# Patient Record
Sex: Female | Born: 1950
Health system: Southern US, Community
[De-identification: ages and names within clinical notes are randomized; demographics above are authoritative.]

## PROBLEM LIST (undated history)

## (undated) DIAGNOSIS — E0590 Thyrotoxicosis, unspecified without thyrotoxic crisis or storm: Secondary | ICD-10-CM

## (undated) DIAGNOSIS — Z8774 Personal history of (corrected) congenital malformations of heart and circulatory system: Secondary | ICD-10-CM

## (undated) DIAGNOSIS — I10 Essential (primary) hypertension: Secondary | ICD-10-CM

## (undated) DIAGNOSIS — I2510 Atherosclerotic heart disease of native coronary artery without angina pectoris: Secondary | ICD-10-CM

## (undated) DIAGNOSIS — M48062 Spinal stenosis, lumbar region with neurogenic claudication: Secondary | ICD-10-CM

## (undated) DIAGNOSIS — R519 Headache, unspecified: Secondary | ICD-10-CM

## (undated) DIAGNOSIS — R51 Headache: Secondary | ICD-10-CM

## (undated) DIAGNOSIS — J449 Chronic obstructive pulmonary disease, unspecified: Secondary | ICD-10-CM

## (undated) DIAGNOSIS — I6529 Occlusion and stenosis of unspecified carotid artery: Secondary | ICD-10-CM

## (undated) DIAGNOSIS — I739 Peripheral vascular disease, unspecified: Secondary | ICD-10-CM

## (undated) HISTORY — DX: Chronic obstructive pulmonary disease, unspecified: J44.9

## (undated) HISTORY — PX: ABDOMINAL HYSTERECTOMY: SHX81

## (undated) HISTORY — PX: OTHER SURGICAL HISTORY: SHX169

## (undated) HISTORY — PX: CORONARY ANGIOPLASTY: SHX604

## (undated) HISTORY — DX: Occlusion and stenosis of unspecified carotid artery: I65.29

## (undated) HISTORY — PX: EYE SURGERY: SHX253

## (undated) MED FILL — Ferumoxytol Inj 510 MG/17ML (30 MG/ML) (Elemental Fe): INTRAVENOUS | Qty: 17 | Status: AC

---

## 2006-04-20 ENCOUNTER — Emergency Department: Payer: Self-pay | Admitting: Unknown Physician Specialty

## 2010-03-23 ENCOUNTER — Ambulatory Visit: Payer: Self-pay | Admitting: Podiatry

## 2010-03-26 ENCOUNTER — Ambulatory Visit: Payer: Self-pay | Admitting: Podiatry

## 2013-04-12 ENCOUNTER — Ambulatory Visit: Payer: Self-pay | Admitting: Internal Medicine

## 2014-11-24 DIAGNOSIS — G43709 Chronic migraine without aura, not intractable, without status migrainosus: Secondary | ICD-10-CM | POA: Insufficient documentation

## 2014-11-24 DIAGNOSIS — I10 Essential (primary) hypertension: Secondary | ICD-10-CM | POA: Insufficient documentation

## 2014-11-24 DIAGNOSIS — I739 Peripheral vascular disease, unspecified: Secondary | ICD-10-CM

## 2014-11-24 DIAGNOSIS — I779 Disorder of arteries and arterioles, unspecified: Secondary | ICD-10-CM | POA: Insufficient documentation

## 2014-12-24 ENCOUNTER — Ambulatory Visit: Payer: Self-pay | Admitting: Internal Medicine

## 2015-01-29 ENCOUNTER — Ambulatory Visit
Admit: 2015-01-29 | Disposition: A | Discharge status: Home / Self Care | Payer: Self-pay | Attending: Internal Medicine | Admitting: Internal Medicine

## 2015-02-02 ENCOUNTER — Ambulatory Visit
Admit: 2015-02-02 | Disposition: A | Discharge status: Home / Self Care | Payer: Self-pay | Attending: Gastroenterology | Admitting: Gastroenterology

## 2015-02-05 ENCOUNTER — Ambulatory Visit
Admit: 2015-02-05 | Disposition: A | Discharge status: Home / Self Care | Payer: Self-pay | Attending: Internal Medicine | Admitting: Internal Medicine

## 2015-02-16 LAB — SURGICAL PATHOLOGY

## 2015-07-06 DIAGNOSIS — Z0000 Encounter for general adult medical examination without abnormal findings: Secondary | ICD-10-CM | POA: Insufficient documentation

## 2015-07-06 DIAGNOSIS — J449 Chronic obstructive pulmonary disease, unspecified: Secondary | ICD-10-CM | POA: Insufficient documentation

## 2015-07-06 HISTORY — DX: Chronic obstructive pulmonary disease, unspecified: J44.9

## 2015-08-13 DIAGNOSIS — M7540 Impingement syndrome of unspecified shoulder: Secondary | ICD-10-CM | POA: Insufficient documentation

## 2016-03-04 DIAGNOSIS — M25559 Pain in unspecified hip: Secondary | ICD-10-CM | POA: Insufficient documentation

## 2016-06-24 ENCOUNTER — Other Ambulatory Visit: Payer: Self-pay

## 2016-06-30 ENCOUNTER — Encounter: Payer: Self-pay | Admitting: *Deleted

## 2016-06-30 ENCOUNTER — Encounter
Admission: RE | Admit: 2016-06-30 | Discharge: 2016-06-30 | Disposition: A | Discharge status: Home / Self Care | Payer: Medicare Other | Source: Ambulatory Visit | Attending: Podiatry | Admitting: Podiatry

## 2016-06-30 DIAGNOSIS — J449 Chronic obstructive pulmonary disease, unspecified: Secondary | ICD-10-CM | POA: Diagnosis not present

## 2016-06-30 DIAGNOSIS — E042 Nontoxic multinodular goiter: Secondary | ICD-10-CM | POA: Diagnosis not present

## 2016-06-30 DIAGNOSIS — F17200 Nicotine dependence, unspecified, uncomplicated: Secondary | ICD-10-CM | POA: Diagnosis not present

## 2016-06-30 DIAGNOSIS — I7789 Other specified disorders of arteries and arterioles: Secondary | ICD-10-CM | POA: Diagnosis not present

## 2016-06-30 DIAGNOSIS — G43909 Migraine, unspecified, not intractable, without status migrainosus: Secondary | ICD-10-CM | POA: Diagnosis not present

## 2016-06-30 DIAGNOSIS — M2012 Hallux valgus (acquired), left foot: Secondary | ICD-10-CM | POA: Diagnosis present

## 2016-06-30 DIAGNOSIS — Z86010 Personal history of colonic polyps: Secondary | ICD-10-CM | POA: Diagnosis not present

## 2016-06-30 DIAGNOSIS — I10 Essential (primary) hypertension: Secondary | ICD-10-CM | POA: Diagnosis not present

## 2016-06-30 HISTORY — DX: Peripheral vascular disease, unspecified: I73.9

## 2016-06-30 HISTORY — DX: Headache: R51

## 2016-06-30 HISTORY — DX: Headache, unspecified: R51.9

## 2016-06-30 HISTORY — DX: Essential (primary) hypertension: I10

## 2016-06-30 LAB — CBC
HEMATOCRIT: 36.9 % (ref 35.0–47.0)
HEMOGLOBIN: 12.6 g/dL (ref 12.0–16.0)
MCH: 32.5 pg (ref 26.0–34.0)
MCHC: 34.1 g/dL (ref 32.0–36.0)
MCV: 95.3 fL (ref 80.0–100.0)
Platelets: 63 10*3/uL — ABNORMAL LOW (ref 150–440)
RBC: 3.87 MIL/uL (ref 3.80–5.20)
RDW: 12.5 % (ref 11.5–14.5)
WBC: 5.3 10*3/uL (ref 3.6–11.0)

## 2016-06-30 LAB — DIFFERENTIAL
BASOS ABS: 0 10*3/uL (ref 0–0.1)
Basophils Relative: 1 %
Eosinophils Absolute: 0.2 10*3/uL (ref 0–0.7)
Eosinophils Relative: 3 %
LYMPHS ABS: 1.9 10*3/uL (ref 1.0–3.6)
MONO ABS: 0.5 10*3/uL (ref 0.2–0.9)
NEUTROS ABS: 2.8 10*3/uL (ref 1.4–6.5)
Neutrophils Relative %: 52 %

## 2016-06-30 LAB — POTASSIUM: Potassium: 3 mmol/L — ABNORMAL LOW (ref 3.5–5.1)

## 2016-06-30 NOTE — Patient Instructions (Addendum)
  Your procedure is scheduled FO:[id number] 8, 2017 (Friday) Report to Same Day Surgery 2nd floor [facility] To find out your arrival time please call [phone] between 1PM - 3PM on June 30, 2016 (Thursday)  Remember: Instructions that are not followed completely may result in serious medical risk, up to and including death, or upon the discretion of your surgeon and anesthesiologist your surgery may need to be rescheduled.    _x___ 1. Do not eat food or drink liquids after midnight. No gum chewing or hard candies.     _x__ 2. No Alcohol for 24 hours before or after surgery.   _x__3. No Smoking for 24 prior to surgery.   ____  4. Bring all medications with you on the day of surgery if instructed.    __x__ 5. Notify your doctor if there is any change in your medical condition     (cold, fever, infections).     Do not wear jewelry, make-up, hairpins, clips or nail polish.  Do not wear lotions, powders, or perfumes. You may wear deodorant.  Do not shave 48 hours prior to surgery. Men may shave face and neck.  Do not bring valuables to the hospital.    [facility] is not responsible for any belongings or valuables.               Contacts, dentures or bridgework may not be worn into surgery.  Leave your suitcase in the car. After surgery it may be brought to your room.  For patients admitted to the hospital, discharge time is determined by your treatment team.   Patients discharged the day of surgery will not be allowed to drive home.    Please read over the following fact sheets that you were given:   [facility] Preparing for Surgery and or MRSA Information   ___ Take these medicines the morning of surgery with A SIP OF WATER:    1.   2.  3.  4.  5.  6.  ____ Fleet Enema (as directed)   _x___ Use CHG Soap or sage wipes as directed on instruction sheet   ____ Use inhalers on the day of surgery and bring to hospital day of surgery  ____ Stop metformin 2 days prior  to surgery    ____ Take 1/2 of usual insulin dose the night before surgery and none on the morning of surgery,            _x___ Stop aspirin or coumadin, or plavix (NO ASPIRIN)  _x__ Stop Anti-inflammatories such as Advil, Aleve, Ibuprofen, Motrin, Naproxen,          Naprosyn, Goodies powders or aspirin products. Ok to take Tylenol.   ____ Stop supplements until after surgery.    ____ Bring C-Pap to the hospital.

## 2016-06-30 NOTE — Pre-Procedure Instructions (Signed)
K+ results called and faxed to Dr. Cleda Mccreedy office, informed Jenny Reichmann patient will need treatment prior to surgery per anesthesia protocol.

## 2016-07-01 ENCOUNTER — Ambulatory Visit
Admission: RE | Admit: 2016-07-01 | Discharge: 2016-07-01 | Disposition: A | Discharge status: Home / Self Care | Payer: Medicare Other | Source: Ambulatory Visit | Attending: Podiatry | Admitting: Podiatry

## 2016-07-01 ENCOUNTER — Encounter
Admission: RE | Disposition: A | Discharge status: Home / Self Care | Payer: Self-pay | Source: Ambulatory Visit | Attending: Podiatry

## 2016-07-01 ENCOUNTER — Ambulatory Visit: Payer: Medicare Other | Admitting: Anesthesiology

## 2016-07-01 ENCOUNTER — Encounter: Payer: Self-pay | Admitting: *Deleted

## 2016-07-01 DIAGNOSIS — I7789 Other specified disorders of arteries and arterioles: Secondary | ICD-10-CM | POA: Insufficient documentation

## 2016-07-01 DIAGNOSIS — E042 Nontoxic multinodular goiter: Secondary | ICD-10-CM | POA: Insufficient documentation

## 2016-07-01 DIAGNOSIS — Z86010 Personal history of colonic polyps: Secondary | ICD-10-CM | POA: Insufficient documentation

## 2016-07-01 DIAGNOSIS — F17200 Nicotine dependence, unspecified, uncomplicated: Secondary | ICD-10-CM | POA: Insufficient documentation

## 2016-07-01 DIAGNOSIS — M2012 Hallux valgus (acquired), left foot: Secondary | ICD-10-CM | POA: Insufficient documentation

## 2016-07-01 DIAGNOSIS — G43909 Migraine, unspecified, not intractable, without status migrainosus: Secondary | ICD-10-CM | POA: Insufficient documentation

## 2016-07-01 DIAGNOSIS — I10 Essential (primary) hypertension: Secondary | ICD-10-CM | POA: Insufficient documentation

## 2016-07-01 DIAGNOSIS — J449 Chronic obstructive pulmonary disease, unspecified: Secondary | ICD-10-CM | POA: Insufficient documentation

## 2016-07-01 HISTORY — PX: HALLUX VALGUS AUSTIN: SHX6623

## 2016-07-01 LAB — POCT I-STAT 4, (NA,K, GLUC, HGB,HCT)
GLUCOSE: 94 mg/dL (ref 65–99)
HEMATOCRIT: 38 % (ref 36.0–46.0)
Hemoglobin: 12.9 g/dL (ref 12.0–15.0)
Potassium: 3.1 mmol/L — ABNORMAL LOW (ref 3.5–5.1)
Sodium: 142 mmol/L (ref 135–145)

## 2016-07-01 LAB — POTASSIUM: POTASSIUM: 3.3 mmol/L — AB (ref 3.5–5.1)

## 2016-07-01 SURGERY — CORRECTION, HALLUX VALGUS
Anesthesia: General | Laterality: Left

## 2016-07-01 MED ORDER — CEFAZOLIN SODIUM-DEXTROSE 2-4 GM/100ML-% IV SOLN
INTRAVENOUS | Status: AC
  Filled 2016-07-01: qty 100

## 2016-07-01 MED ORDER — FENTANYL CITRATE (PF) 100 MCG/2ML IJ SOLN: 25.0000 ug | INTRAMUSCULAR | Status: DC | PRN

## 2016-07-01 MED ORDER — NEOMYCIN-POLYMYXIN B GU 40-200000 IR SOLN
Status: AC
  Filled 2016-07-01: qty 2

## 2016-07-01 MED ORDER — BUPIVACAINE HCL (PF) 0.5 % IJ SOLN
INTRAMUSCULAR | Status: AC
Start: 2016-07-01 — End: 2016-07-01
  Filled 2016-07-01: qty 30

## 2016-07-01 MED ORDER — FAMOTIDINE 20 MG PO TABS
ORAL_TABLET | ORAL | Status: AC
  Filled 2016-07-01: qty 1

## 2016-07-01 MED ORDER — PROPOFOL 10 MG/ML IV BOLUS
INTRAVENOUS | Status: DC | PRN
  Administered 2016-07-01: 20 mg via INTRAVENOUS
  Administered 2016-07-01 (×2): 30 mg via INTRAVENOUS
  Administered 2016-07-01: 50 mg via INTRAVENOUS
  Administered 2016-07-01: 30 mg via INTRAVENOUS
  Administered 2016-07-01: 20 mg via INTRAVENOUS
  Administered 2016-07-01 (×2): 30 mg via INTRAVENOUS

## 2016-07-01 MED ORDER — CEFAZOLIN SODIUM-DEXTROSE 2-4 GM/100ML-% IV SOLN
2.0000 g | Freq: Once | INTRAVENOUS | Status: AC
Start: 2016-07-01 — End: 2016-07-01
  Administered 2016-07-01: 2 g via INTRAVENOUS

## 2016-07-01 MED ORDER — OXYCODONE HCL 5 MG PO TABS: 5.0000 mg | ORAL_TABLET | Freq: Once | ORAL | Status: DC | PRN

## 2016-07-01 MED ORDER — PROPOFOL 500 MG/50ML IV EMUL
INTRAVENOUS | Status: DC | PRN
  Administered 2016-07-01: 120 ug/kg/min via INTRAVENOUS

## 2016-07-01 MED ORDER — BUPIVACAINE HCL (PF) 0.5 % IJ SOLN
INTRAMUSCULAR | Status: DC | PRN
  Administered 2016-07-01: 10 mL

## 2016-07-01 MED ORDER — FAMOTIDINE 20 MG PO TABS
20.0000 mg | ORAL_TABLET | Freq: Once | ORAL | Status: AC
  Administered 2016-07-01: 20 mg via ORAL

## 2016-07-01 MED ORDER — HYDROCODONE-ACETAMINOPHEN 5-325 MG PO TABS: 1.0000 | ORAL_TABLET | ORAL | 0 refills | Status: DC | PRN

## 2016-07-01 MED ORDER — LIDOCAINE HCL (PF) 1 % IJ SOLN
INTRAMUSCULAR | Status: AC
  Filled 2016-07-01: qty 30

## 2016-07-01 MED ORDER — OXYCODONE HCL 5 MG/5ML PO SOLN: 5.0000 mg | Freq: Once | ORAL | Status: DC | PRN

## 2016-07-01 MED ORDER — EPHEDRINE SULFATE 50 MG/ML IJ SOLN
INTRAMUSCULAR | Status: DC | PRN
  Administered 2016-07-01 (×4): 5 mg via INTRAVENOUS

## 2016-07-01 MED ORDER — LIDOCAINE HCL (CARDIAC) 20 MG/ML IV SOLN
INTRAVENOUS | Status: DC | PRN
  Administered 2016-07-01: 50 mg via INTRAVENOUS

## 2016-07-01 MED ORDER — NEOMYCIN-POLYMYXIN B GU 40-200000 IR SOLN
Status: DC | PRN
  Administered 2016-07-01: 2 mL

## 2016-07-01 MED ORDER — LACTATED RINGERS IV SOLN
INTRAVENOUS | Status: DC
  Administered 2016-07-01 (×2): via INTRAVENOUS

## 2016-07-01 MED ORDER — BUPIVACAINE-EPINEPHRINE (PF) 0.5% -1:200000 IJ SOLN
INTRAMUSCULAR | Status: AC
Start: 2016-07-01 — End: 2016-07-01
  Filled 2016-07-01: qty 30

## 2016-07-01 SURGICAL SUPPLY — 56 items
BAG COUNTER SPONGE EZ (MISCELLANEOUS) IMPLANT
BANDAGE ELASTIC 4 LF NS (GAUZE/BANDAGES/DRESSINGS) ×3 IMPLANT
BIT DRILL TWST CANN 2.2X1.87MM (DRILL) ×1 IMPLANT
BIT DRILL TWST COUNTRSNK 122IN (MISCELLANEOUS) ×1 IMPLANT
BLADE MED AGGRESSIVE (BLADE) ×3 IMPLANT
BLADE OSC/SAGITTAL MD 5.5X18 (BLADE) IMPLANT
BLADE SURG 15 STRL LF DISP TIS (BLADE) ×2 IMPLANT
BLADE SURG 15 STRL SS (BLADE) ×4
BNDG ESMARK 4X12 TAN STRL LF (GAUZE/BANDAGES/DRESSINGS) ×3 IMPLANT
CANISTER SUCT 1200ML W/VALVE (MISCELLANEOUS) ×3 IMPLANT
CLOSURE WOUND 1/4X4 (GAUZE/BANDAGES/DRESSINGS) ×1
COUNTER SPONGE BAG EZ (MISCELLANEOUS)
CUFF TOURN 18 STER (MISCELLANEOUS) IMPLANT
CUFF TOURN DUAL PL 12 NO SLV (MISCELLANEOUS) ×3 IMPLANT
DRAPE FLUOR MINI C-ARM 54X84 (DRAPES) ×3 IMPLANT
DRILL TWIST CANN 2.2X1.87MM (DRILL) ×3
DRILL TWIST COUNTERSINK 122IN (MISCELLANEOUS) ×3
DRSG TELFA 3X8 NADH (GAUZE/BANDAGES/DRESSINGS) ×3 IMPLANT
DURAPREP 26ML APPLICATOR (WOUND CARE) ×3 IMPLANT
ELECT REM PT RETURN 9FT ADLT (ELECTROSURGICAL) ×3
ELECTRODE REM PT RTRN 9FT ADLT (ELECTROSURGICAL) ×1 IMPLANT
GAUZE PETRO XEROFOAM 1X8 (MISCELLANEOUS) ×3 IMPLANT
GAUZE SPONGE 4X4 12PLY STRL (GAUZE/BANDAGES/DRESSINGS) ×3 IMPLANT
GAUZE STRETCH 2X75IN STRL (MISCELLANEOUS) ×6 IMPLANT
GLOVE BIO SURGEON STRL SZ7.5 (GLOVE) ×9 IMPLANT
GLOVE INDICATOR 8.0 STRL GRN (GLOVE) ×6 IMPLANT
GOWN STRL REUS W/ TWL LRG LVL3 (GOWN DISPOSABLE) ×2 IMPLANT
GOWN STRL REUS W/TWL LRG LVL3 (GOWN DISPOSABLE) ×4
K-WIRE 0.8X100 LANCET TIP (WIRE) ×3
KWIRE 0.8X100 LANCET TIP (WIRE) ×1 IMPLANT
LABEL OR SOLS (LABEL) ×3 IMPLANT
NEEDLE FILTER BLUNT 18X 1/2SAF (NEEDLE) ×4
NEEDLE FILTER BLUNT 18X1 1/2 (NEEDLE) ×2 IMPLANT
NEEDLE HYPO 25X1 1.5 SAFETY (NEEDLE) ×6 IMPLANT
NS IRRIG 500ML POUR BTL (IV SOLUTION) ×3 IMPLANT
PACK EXTREMITY ARMC (MISCELLANEOUS) ×3 IMPLANT
PAD CAST CTTN 4X4 STRL (SOFTGOODS) ×1 IMPLANT
PADDING CAST COTTON 4X4 STRL (SOFTGOODS) ×2
PENCIL ELECTRO HAND CTR (MISCELLANEOUS) IMPLANT
RASP SM TEAR CROSS CUT (RASP) ×3 IMPLANT
SCREW COMP CANN 2.2X24MM (Screw) ×3 IMPLANT
SOL PREP PVP 2OZ (MISCELLANEOUS) ×3
SOLUTION PREP PVP 2OZ (MISCELLANEOUS) ×1 IMPLANT
SPLINT CAST 1 STEP 5X30 WHT (MISCELLANEOUS) ×3 IMPLANT
SPLINT FAST PLASTER 5X30 (CAST SUPPLIES)
SPLINT PLASTER CAST FAST 5X30 (CAST SUPPLIES) IMPLANT
STOCKINETTE STRL 6IN 960660 (GAUZE/BANDAGES/DRESSINGS) ×3 IMPLANT
STRAP SAFETY BODY (MISCELLANEOUS) IMPLANT
STRIP CLOSURE SKIN 1/4X4 (GAUZE/BANDAGES/DRESSINGS) ×2 IMPLANT
SUT VIC AB 4-0 FS2 27 (SUTURE) ×3 IMPLANT
SWABSTK COMLB BENZOIN TINCTURE (MISCELLANEOUS) ×3 IMPLANT
SYR 3ML LL SCALE MARK (SYRINGE) ×3 IMPLANT
SYRINGE 10CC LL (SYRINGE) ×3 IMPLANT
WIRE K 1.1 (MISCELLANEOUS) ×3 IMPLANT
WIRE Z .045 C-WIRE SPADE TIP (WIRE) IMPLANT
WIRE Z .062 C-WIRE SPADE TIP (WIRE) IMPLANT

## 2016-07-01 NOTE — H&P (Signed)
  History and physical in the chart was reviewed. No interval changes. Patient stable for surgery 

## 2016-07-01 NOTE — Progress Notes (Signed)
Lab here to draw potassium.

## 2016-07-01 NOTE — Anesthesia Preprocedure Evaluation (Signed)
Anesthesia Evaluation  Patient identified by MRN, date of birth, ID band Patient awake    Reviewed: Allergy & Precautions, H&P , NPO status , Patient's Chart, lab work & pertinent test results  History of Anesthesia Complications Negative for: history of anesthetic complications  Airway Mallampati: II  TM Distance: >3 FB Neck ROM: limited    Dental  (+) Poor Dentition, Chipped, Missing, Upper Dentures, Partial Upper   Pulmonary neg shortness of breath, Current Smoker,    Pulmonary exam normal breath sounds clear to auscultation       Cardiovascular Exercise Tolerance: Good hypertension, (-) angina+ Peripheral Vascular Disease  (-) Past MI and (-) DOE Normal cardiovascular exam Rhythm:regular Rate:Normal     Neuro/Psych  Headaches, negative psych ROS   GI/Hepatic negative GI ROS, Neg liver ROS,   Endo/Other  negative endocrine ROS  Renal/GU negative Renal ROS  negative genitourinary   Musculoskeletal   Abdominal   Peds  Hematology negative hematology ROS (+)   Anesthesia Other Findings Past Medical History: No date: Headache No date: Hypertension No date: Peripheral vascular disease (HCC)     Comment: Bilateral Carotid Artery Disease  Past Surgical History: No date: ABDOMINAL HYSTERECTOMY No date: hallux vagus correction Right     Reproductive/Obstetrics negative OB ROS                             Anesthesia Physical Anesthesia Plan  ASA: III  Anesthesia Plan: General   Post-op Pain Management:    Induction:   Airway Management Planned:   Additional Equipment:   Intra-op Plan:   Post-operative Plan:   Informed Consent: I have reviewed the patients History and Physical, chart, labs and discussed the procedure including the risks, benefits and alternatives for the proposed anesthesia with the patient or authorized representative who has indicated his/her understanding  and acceptance.   Dental Advisory Given  Plan Discussed with: Anesthesiologist, CRNA and Surgeon  Anesthesia Plan Comments:         Anesthesia Quick Evaluation

## 2016-07-01 NOTE — Interval H&P Note (Signed)
History and Physical Interval Note:  07/01/2016 7:15 AM  Jasmine Acosta  has presented today for surgery, with the diagnosis of Hallux valgus left foot/ M20.12  The various methods of treatment have been discussed with the patient and family. After consideration of risks, benefits and other options for treatment, the patient has consented to  Procedure(s): [name] left foot (Left) as a surgical intervention .  The patient's history has been reviewed, patient examined, no change in status, stable for surgery.  I have reviewed the patient's chart and labs.  Questions were answered to the patient's satisfaction.     Durward Fortes

## 2016-07-01 NOTE — Anesthesia Procedure Notes (Signed)
Procedure Name: MAC Date/Time: 07/01/2016 7:28 AM Performed by: Darlyne Russian Pre-anesthesia Checklist: Patient identified, Emergency Drugs available, Suction available and Patient being monitored Patient Re-evaluated:Patient Re-evaluated prior to inductionOxygen Delivery Method: Nasal cannula Intubation Type: IV induction

## 2016-07-01 NOTE — Anesthesia Postprocedure Evaluation (Signed)
Anesthesia Post Note  Patient: Jasmine Acosta  Procedure(s) Performed: Procedure(s) (LRB): Turley left foot (Left)  Patient location during evaluation: PACU Anesthesia Type: General Level of consciousness: awake and alert Pain management: pain level controlled Vital Signs Assessment: post-procedure vital signs reviewed and stable Respiratory status: spontaneous breathing, nonlabored ventilation, respiratory function stable and patient connected to nasal cannula oxygen Cardiovascular status: blood pressure returned to baseline and stable Postop Assessment: no signs of nausea or vomiting Anesthetic complications: no    Last Vitals:  Vitals:   07/01/16 0941 07/01/16 1000  BP: (!) 117/44 109/64  Pulse: 60 61  Resp: 16   Temp: (!) 35.9 C     Last Pain:  Vitals:   07/01/16 0941  TempSrc: Tympanic                 Precious Haws Piscitello

## 2016-07-01 NOTE — Transfer of Care (Signed)
Immediate Anesthesia Transfer of Care Note  Patient: Jasmine Acosta  Procedure(s) Performed: Procedure(s): Langlois left foot (Left)  Patient Location: PACU  Anesthesia Type:General  Level of Consciousness: awake, alert  and oriented  Airway & Oxygen Therapy: Patient Spontanous Breathing and Patient connected to nasal cannula oxygen  Post-op Assessment: Report given to RN, Post -op Vital signs reviewed and stable and Patient moving all extremities  Post vital signs: Reviewed and stable  Last Vitals:  Vitals:   07/01/16 0609 07/01/16 0858  BP: 136/80 107/64  Pulse: (!) 59 84  Resp: 16 18  Temp: 36.7 C 36.2 C    Last Pain:  Vitals:   07/01/16 0609  TempSrc: Oral         Complications: No apparent anesthesia complications

## 2016-07-01 NOTE — Discharge Instructions (Addendum)
1. Elevate left lower extremity on 2 pillows.  2. Keep the bandage on the left foot clean, dry, and do not remove.  3. Sponge bathe only left lower extremity..  4. Wear surgical shoe on the left foot whenever walking or standing.  5. Take one pain pill, Norco, every 4 hours if needed for pain.  AMBULATORY SURGERY  DISCHARGE INSTRUCTIONS   1) The drugs that you were given will stay in your system until tomorrow so for the next 24 hours you should not:  A) Drive an automobile B) Make any legal decisions C) Drink any alcoholic beverage   2) You may resume regular meals tomorrow.  Today it is better to start with liquids and gradually work up to solid foods.  You may eat anything you prefer, but it is better to start with liquids, then soup and crackers, and gradually work up to solid foods.   3) Please notify your doctor immediately if you have any unusual bleeding, trouble breathing, redness and pain at the surgery site, drainage, fever, or pain not relieved by medication.    4) Additional Instructions:        Please contact your physician with any problems or Same Day Surgery at [phone], Monday through Friday 6 am to 4 pm, or [place] [facility] number at [phone].

## 2016-07-01 NOTE — Op Note (Signed)
Date of operation: 07/01/2016.  Surgeon: Durward Fortes DPM.  Preoperative diagnosis: Hallux valgus deformity left foot.  Postoperative diagnosis: Same.  Procedure: Austin-type hallux valgus correction left foot.  Anesthesia: Local Mac.  Hemostasis: Pneumatic tourniquet left ankle 250 mmHg.  Estimated blood loss: Less than 5 cc.  Pathology: None.  Materials: One 2.2 mm, 24 mm length Medartis compression screw.  Complications: None apparent.  Operative indications: This is a 65 year old female with a complaint of a chronic painful bunion on her left foot. She elects for surgical correction as previously performed on the right foot years ago.  Operative procedure: Patient was taken to the operating room and placed on the table in the supine position. Going satisfactory sedation the left foot was anesthetized with 10 cc of 0.5% bupivacaine plain around the first metatarsal. A pneumatic tourniquet was then applied at the level of the left ankle and the foot was prepped and draped in the usual sterile fashion. The foot was exsanguinated and the tourniquet inflated to 250 mmHg. Attention was then directed to the dorsal aspect of the left foot where an approximate 5 cm linear incision was made coursing proximal to distal centered over the first metatarsal and metatarsophalangeal joint. The incision was deepened via sharp and blunt dissection down to the level of the joint where a linear capsulotomy was performed. Capsular and periosteal tissues reflected off of the dorsal and medial aspect of the head of the first metatarsal. Significant bony exostosis was noted along the medial eminence. This was resected using a sagittal saw. Next a wire from the Medartis screw set was then driven from medial to lateral as an axis guide and a V-shaped osteotomy was performed through the head of the first metatarsal. The pin was removed. Attention was then directed to the first interspace where the incision was  deepened via sharp and blunt dissection down to the lateral aspect of the joint. The transverse intermetatarsal ligament was incised along with the abductor tendon. A lateral capsulotomy with freeing of the sesamoid apparatus was performed. It was noted that there was a surgical fracture in the fibular sesamoid with a small lateral fragment. This was excised with care taken to leave the tendon intact and the larger remaining fragment. Good release of the contracture on the great toe was noted. Attention then was redirected back medially where the head of the first metatarsal was transposed laterally and fixated using a wire from the screw set. Intraoperative FluoroScan views revealed good reduction of the deformity and 10 placement. A 2.2 mm, 24 mm length Medartis compression screw was then inserted using proper technique. Follow-up FluoroScan views revealed good alignment of the first ray with placement of the screw. The redundant bone medially was then resected using a sagittal saw and the edges were rasped smooth. Wound was flushed with copious amounts of sterile saline and closed using 4-0 Vicryl for all layers and a running fashion ROM capsular and periosteal tissue through deep and superficial subcutaneous tissue and then followed by skin closure. Tincture benzoin and Steri-Strips applied followed by Xeroform and a sterile bandage. Tourniquet was released and blood flow noted to return to medially to the left foot and all digits. A fiberglass posterior splint was then applied to the left lower extremity with the foot 90 relative to the leg. This was secured using an Ace wrap. Patient tolerated the procedure and anesthesia well and was transported to the PACU with vital signs stable and in good condition.

## 2016-07-01 NOTE — Progress Notes (Signed)
Dr. Randa Lynn notified of bedside potassium result Of 3.1.  Lab to redraw.

## 2016-09-01 ENCOUNTER — Other Ambulatory Visit: Payer: Self-pay | Admitting: Internal Medicine

## 2016-09-01 DIAGNOSIS — Z1231 Encounter for screening mammogram for malignant neoplasm of breast: Secondary | ICD-10-CM

## 2016-10-05 ENCOUNTER — Ambulatory Visit: Payer: Medicare Other

## 2016-11-11 ENCOUNTER — Ambulatory Visit: Payer: Medicare Other

## 2016-12-07 ENCOUNTER — Ambulatory Visit
Admission: RE | Admit: 2016-12-07 | Discharge: 2016-12-07 | Disposition: A | Discharge status: Home / Self Care | Payer: Medicare Other | Source: Ambulatory Visit | Attending: Internal Medicine | Admitting: Internal Medicine

## 2016-12-07 DIAGNOSIS — Z1231 Encounter for screening mammogram for malignant neoplasm of breast: Secondary | ICD-10-CM | POA: Diagnosis not present

## 2017-04-06 DIAGNOSIS — I739 Peripheral vascular disease, unspecified: Secondary | ICD-10-CM | POA: Insufficient documentation

## 2017-04-07 ENCOUNTER — Other Ambulatory Visit: Payer: Self-pay | Admitting: Internal Medicine

## 2017-04-07 DIAGNOSIS — M5432 Sciatica, left side: Secondary | ICD-10-CM

## 2017-04-14 ENCOUNTER — Ambulatory Visit
Admission: RE | Admit: 2017-04-14 | Discharge: 2017-04-14 | Disposition: A | Discharge status: Home / Self Care | Payer: Medicare Other | Source: Ambulatory Visit | Attending: Internal Medicine | Admitting: Internal Medicine

## 2017-04-14 DIAGNOSIS — M5442 Lumbago with sciatica, left side: Secondary | ICD-10-CM | POA: Diagnosis not present

## 2017-04-14 DIAGNOSIS — M5137 Other intervertebral disc degeneration, lumbosacral region: Secondary | ICD-10-CM | POA: Diagnosis not present

## 2017-04-14 DIAGNOSIS — M5432 Sciatica, left side: Secondary | ICD-10-CM

## 2017-04-14 DIAGNOSIS — M5126 Other intervertebral disc displacement, lumbar region: Secondary | ICD-10-CM | POA: Diagnosis not present

## 2017-04-14 DIAGNOSIS — M8938 Hypertrophy of bone, other site: Secondary | ICD-10-CM | POA: Diagnosis not present

## 2017-06-22 ENCOUNTER — Encounter: Payer: Self-pay | Admitting: Obstetrics and Gynecology

## 2017-06-28 ENCOUNTER — Ambulatory Visit (INDEPENDENT_AMBULATORY_CARE_PROVIDER_SITE_OTHER): Payer: Medicare Other | Admitting: Obstetrics and Gynecology

## 2017-06-28 ENCOUNTER — Encounter: Payer: Self-pay | Admitting: Obstetrics and Gynecology

## 2017-06-28 VITALS — BP 108/61 | HR 86 | Ht 61.0 in | Wt 108.4 lb

## 2017-06-28 DIAGNOSIS — Z90710 Acquired absence of both cervix and uterus: Secondary | ICD-10-CM

## 2017-06-28 DIAGNOSIS — Z1211 Encounter for screening for malignant neoplasm of colon: Secondary | ICD-10-CM

## 2017-06-28 DIAGNOSIS — Z780 Asymptomatic menopausal state: Secondary | ICD-10-CM | POA: Diagnosis not present

## 2017-06-28 DIAGNOSIS — Z720 Tobacco use: Secondary | ICD-10-CM | POA: Insufficient documentation

## 2017-06-28 DIAGNOSIS — Z01419 Encounter for gynecological examination (general) (routine) without abnormal findings: Secondary | ICD-10-CM

## 2017-06-28 DIAGNOSIS — N952 Postmenopausal atrophic vaginitis: Secondary | ICD-10-CM

## 2017-06-28 DIAGNOSIS — Z1239 Encounter for other screening for malignant neoplasm of breast: Secondary | ICD-10-CM

## 2017-06-28 NOTE — Patient Instructions (Signed)
1. No Pap smear needed 2. Mammogram ordered 3. Stool guaiac cards are given for colon cancer screening 4. Recommend healthy eating with exercise area 5. Recommend calcium with vitamin D supplementation 1200 mg a day 6. Return in 1 year for annual exam 7. Smoking cessation strongly encouraged   Health Maintenance for Postmenopausal Women Menopause is a normal process in which your reproductive ability comes to an end. This process happens gradually over a span of months to years, usually between the ages of 10 and 24. Menopause is complete when you have missed 12 consecutive menstrual periods. It is important to talk with your health care provider about some of the most common conditions that affect postmenopausal women, such as heart disease, cancer, and bone loss (osteoporosis). Adopting a healthy lifestyle and getting preventive care can help to promote your health and wellness. Those actions can also lower your chances of developing some of these common conditions. What should I know about menopause? During menopause, you may experience a number of symptoms, such as:  Moderate-to-severe hot flashes.  Night sweats.  Decrease in sex drive.  Mood swings.  Headaches.  Tiredness.  Irritability.  Memory problems.  Insomnia.  Choosing to treat or not to treat menopausal changes is an individual decision that you make with your health care provider. What should I know about hormone replacement therapy and supplements? Hormone therapy products are effective for treating symptoms that are associated with menopause, such as hot flashes and night sweats. Hormone replacement carries certain risks, especially as you become older. If you are thinking about using estrogen or estrogen with progestin treatments, discuss the benefits and risks with your health care provider. What should I know about heart disease and stroke? Heart disease, heart attack, and stroke become more likely as you age.  This may be due, in part, to the hormonal changes that your body experiences during menopause. These can affect how your body processes dietary fats, triglycerides, and cholesterol. Heart attack and stroke are both medical emergencies. There are many things that you can do to help prevent heart disease and stroke:  Have your blood pressure checked at least every 1-2 years. High blood pressure causes heart disease and increases the risk of stroke.  If you are 10-9 years old, ask your health care provider if you should take aspirin to prevent a heart attack or a stroke.  Do not use any tobacco products, including cigarettes, chewing tobacco, or electronic cigarettes. If you need help quitting, ask your health care provider.  It is important to eat a healthy diet and maintain a healthy weight. ? Be sure to include plenty of vegetables, fruits, low-fat dairy products, and lean protein. ? Avoid eating foods that are high in solid fats, added sugars, or salt (sodium).  Get regular exercise. This is one of the most important things that you can do for your health. ? Try to exercise for at least 150 minutes each week. The type of exercise that you do should increase your heart rate and make you sweat. This is known as moderate-intensity exercise. ? Try to do strengthening exercises at least twice each week. Do these in addition to the moderate-intensity exercise.  Know your numbers.Ask your health care provider to check your cholesterol and your blood glucose. Continue to have your blood tested as directed by your health care provider.  What should I know about cancer screening? There are several types of cancer. Take the following steps to reduce your risk and to  catch any cancer development as early as possible. Breast Cancer  Practice breast self-awareness. ? This means understanding how your breasts normally appear and feel. ? It also means doing regular breast self-exams. Let your health care  provider know about any changes, no matter how small.  If you are 22 or older, have a clinician do a breast exam (clinical breast exam or CBE) every year. Depending on your age, family history, and medical history, it may be recommended that you also have a yearly breast X-ray (mammogram).  If you have a family history of breast cancer, talk with your health care provider about genetic screening.  If you are at high risk for breast cancer, talk with your health care provider about having an MRI and a mammogram every year.  Breast cancer (BRCA) gene test is recommended for women who have family members with BRCA-related cancers. Results of the assessment will determine the need for genetic counseling and BRCA1 and for BRCA2 testing. BRCA-related cancers include these types: ? Breast. This occurs in males or females. ? Ovarian. ? Tubal. This may also be called fallopian tube cancer. ? Cancer of the abdominal or pelvic lining (peritoneal cancer). ? Prostate. ? Pancreatic.  Cervical, Uterine, and Ovarian Cancer Your health care provider may recommend that you be screened regularly for cancer of the pelvic organs. These include your ovaries, uterus, and vagina. This screening involves a pelvic exam, which includes checking for microscopic changes to the surface of your cervix (Pap test).  For women ages 21-65, health care providers may recommend a pelvic exam and a Pap test every three years. For women ages 39-65, they may recommend the Pap test and pelvic exam, combined with testing for human papilloma virus (HPV), every five years. Some types of HPV increase your risk of cervical cancer. Testing for HPV may also be done on women of any age who have unclear Pap test results.  Other health care providers may not recommend any screening for nonpregnant women who are considered low risk for pelvic cancer and have no symptoms. Ask your health care provider if a screening pelvic exam is right for  you.  If you have had past treatment for cervical cancer or a condition that could lead to cancer, you need Pap tests and screening for cancer for at least 20 years after your treatment. If Pap tests have been discontinued for you, your risk factors (such as having a new sexual partner) need to be reassessed to determine if you should start having screenings again. Some women have medical problems that increase the chance of getting cervical cancer. In these cases, your health care provider may recommend that you have screening and Pap tests more often.  If you have a family history of uterine cancer or ovarian cancer, talk with your health care provider about genetic screening.  If you have vaginal bleeding after reaching menopause, tell your health care provider.  There are currently no reliable tests available to screen for ovarian cancer.  Lung Cancer Lung cancer screening is recommended for adults 52-53 years old who are at high risk for lung cancer because of a history of smoking. A yearly low-dose CT scan of the lungs is recommended if you:  Currently smoke.  Have a history of at least 30 pack-years of smoking and you currently smoke or have quit within the past 15 years. A pack-year is smoking an average of one pack of cigarettes per day for one year.  Yearly screening should:  Continue until it has been 15 years since you quit.  Stop if you develop a health problem that would prevent you from having lung cancer treatment.  Colorectal Cancer  This type of cancer can be detected and can often be prevented.  Routine colorectal cancer screening usually begins at age 5 and continues through age 74.  If you have risk factors for colon cancer, your health care provider may recommend that you be screened at an earlier age.  If you have a family history of colorectal cancer, talk with your health care provider about genetic screening.  Your health care provider may also recommend  using home test kits to check for hidden blood in your stool.  A small camera at the end of a tube can be used to examine your colon directly (sigmoidoscopy or colonoscopy). This is done to check for the earliest forms of colorectal cancer.  Direct examination of the colon should be repeated every 5-10 years until age 3. However, if early forms of precancerous polyps or small growths are found or if you have a family history or genetic risk for colorectal cancer, you may need to be screened more often.  Skin Cancer  Check your skin from head to toe regularly.  Monitor any moles. Be sure to tell your health care provider: ? About any new moles or changes in moles, especially if there is a change in a mole's shape or color. ? If you have a mole that is larger than the size of a pencil eraser.  If any of your family members has a history of skin cancer, especially at a young age, talk with your health care provider about genetic screening.  Always use sunscreen. Apply sunscreen liberally and repeatedly throughout the day.  Whenever you are outside, protect yourself by wearing long sleeves, pants, a wide-brimmed hat, and sunglasses.  What should I know about osteoporosis? Osteoporosis is a condition in which bone destruction happens more quickly than new bone creation. After menopause, you may be at an increased risk for osteoporosis. To help prevent osteoporosis or the bone fractures that can happen because of osteoporosis, the following is recommended:  If you are 64-26 years old, get at least 1,000 mg of calcium and at least 600 mg of vitamin D per day.  If you are older than age 79 but younger than age 66, get at least 1,200 mg of calcium and at least 600 mg of vitamin D per day.  If you are older than age 36, get at least 1,200 mg of calcium and at least 800 mg of vitamin D per day.  Smoking and excessive alcohol intake increase the risk of osteoporosis. Eat foods that are rich in  calcium and vitamin D, and do weight-bearing exercises several times each week as directed by your health care provider. What should I know about how menopause affects my mental health? Depression may occur at any age, but it is more common as you become older. Common symptoms of depression include:  Low or sad mood.  Changes in sleep patterns.  Changes in appetite or eating patterns.  Feeling an overall lack of motivation or enjoyment of activities that you previously enjoyed.  Frequent crying spells.  Talk with your health care provider if you think that you are experiencing depression. What should I know about immunizations? It is important that you get and maintain your immunizations. These include:  Tetanus, diphtheria, and pertussis (Tdap) booster vaccine.  Influenza every year before the  flu season begins.  Pneumonia vaccine.  Shingles vaccine.  Your health care provider may also recommend other immunizations. This information is not intended to replace advice given to you by your health care provider. Make sure you discuss any questions you have with your health care provider. Document Released: 12/02/2005 Document Revised: 04/29/2016 Document Reviewed: 07/14/2015 Elsevier Interactive Patient Education  2018 [employer].

## 2017-06-28 NOTE — Progress Notes (Signed)
ANNUAL PREVENTATIVE CARE GYN  ENCOUNTER NOTE  Subjective:       Jasmine Acosta is a 66 y.o. G0P0 female here for a routine annual gynecologic exam.  Current complaints: 1.  None  Widowed female whose last year. No children. Remote history of abnormal Pap smears-years ago Normal bowel function. Normal bladder function. No stress incontinence symptoms. No urgency. Patient does take calcium with vitamin D daily. Patient does exercise daily-walking extensively   Gynecologic History No LMP recorded. Patient has had a hysterectomy. Contraception: status post hysterectomy Last Pap: unknown. Results were: normal Last mammogram: 11/2016 birad 1 Results were: normal Menarche age 47 TAH age 27-Dr. Randon Goldsmith Menopause age 61 No current significant vasomotor symptoms. No history of estrogen replacement therapy use.  Obstetric History OB History  Gravida Para Term Preterm AB Living  0            SAB TAB Ectopic Multiple Live Births                   Past Medical History:  Diagnosis Date  . Headache   . Hypertension   . Peripheral vascular disease ([place])    Bilateral Carotid Artery Disease    Past Surgical History:  Procedure Laterality Date  . ABDOMINAL HYSTERECTOMY     heavy bleeding  . hallux vagus correction Right   . HALLUX VALGUS AUSTIN Left 07/01/2016   Procedure: HALLUX VALGUS AUSTIN left foot;  Surgeon: Sharlotte Alamo, DPM;  Location: [facility] ORS;  Service: Podiatry;  Laterality: Left;    Current Outpatient Prescriptions on File Prior to Visit  Medication Sig Dispense Refill  . lisinopril-hydrochlorothiazide (PRINZIDE,ZESTORETIC) 20-25 MG tablet Take 1 tablet by mouth daily.    . Multiple Vitamins-Calcium (ONE-A-DAY WOMENS PO) Take 1 tablet by mouth daily.    . simvastatin (ZOCOR) 20 MG tablet Take 20 mg by mouth daily at 6 PM.     No current facility-administered medications on file prior to visit.     Allergies  Allergen Reactions  . Codeine Nausea And Vomiting     Social History   Social History  . Marital status: Single    Spouse name: N/A  . Number of children: N/A  . Years of education: N/A   Occupational History  . Not on file.   Social History Main Topics  . Smoking status: Current Every Day Smoker    Packs/day: 0.25    Types: Cigarettes  . Smokeless tobacco: Never Used  . Alcohol use No  . Drug use: No  . Sexual activity: Not on file   Other Topics Concern  . Not on file   Social History Narrative  . No narrative on file    Family History  Problem Relation Age of Onset  . Diabetes Mother   . Emphysema Father   . Breast cancer Neg Hx     The following portions of the patient's history were reviewed and updated as appropriate: allergies, current medications, past family history, past medical history, past social history, past surgical history and problem list.  Review of Systems Review of Systems  Constitutional: Negative.   HENT: Negative.   Eyes: Negative.   Respiratory: Negative.   Cardiovascular: Negative.   Gastrointestinal: Negative.   Genitourinary: Negative.   Musculoskeletal: Negative.   Skin: Negative.   Neurological: Negative.   Endo/Heme/Allergies: Negative.   Psychiatric/Behavioral: Negative.     Objective:   BP 108/61   Pulse 86   Ht 5\' 1"  (1.549 m)   Wt 108  lb 6.4 oz (49.2 kg)   BMI 20.48 kg/m  CONSTITUTIONAL: Well-developed, well-nourished female in no acute distress.  PSYCHIATRIC: Normal mood and affect. Normal behavior. Normal judgment and thought content. [place]: Alert and oriented to person, place, and time. Normal muscle tone coordination. No cranial nerve deficit noted. HENT:  Normocephalic, atraumatic, External right and left ear normal. Oropharynx is clear and moist EYES: Conjunctivae and EOM are normal.  No scleral icterus.  NECK: Normal range of motion, supple, no masses.  Normal thyroid.  SKIN: Skin is warm and dry. No rash noted. Not diaphoretic. No erythema. No  pallor. CARDIOVASCULAR: Normal heart rate noted, regular rhythm, no murmur. RESPIRATORY: Clear to auscultation bilaterally. Effort and breath sounds normal, no problems with respiration noted. BREASTS: Symmetric in size. No masses, skin changes, nipple drainage, or lymphadenopathy. ABDOMEN: Soft, normal bowel sounds, no distention noted.  No tenderness, rebound or guarding.  BLADDER: Normal PELVIC:  External Genitalia: Normal  BUS: Normal  Vagina: Atrophic changes; good vault support  Cervix: Surgically absent  Uterus: Surgically absent  Adnexa: Normal; nonpalpable and nontender  RV: External Exam NormaI, No Rectal Masses and Normal Sphincter tone  MUSCULOSKELETAL: Normal range of motion. No tenderness.  No cyanosis, clubbing, or edema.  2+ distal pulses. LYMPHATIC: No Axillary, Supraclavicular, or Inguinal Adenopathy.    Assessment:   Annual gynecologic examination 66 y.o. Contraception: status post hysterectomy Normal BMI Menopause Vaginal atrophy, asymptomatic Status post TAH Remote history of cervical dysplasia with no recent abnormal Pap smears Tobacco user  Plan:  Pap: Not needed Mammogram: Ordered Stool Guaiac Testing:  Ordered Labs: thru pcp Routine preventative health maintenance measures emphasized: Exercise/Diet/Weight control, Tobacco Warnings and Alcohol/Substance use risks  Smoking cessation encouraged No medical management necessary for vaginal atrophy at this time Return to [place]' Brayton Mars, MD  Note: This dictation was prepared with Dragon dictation along with smaller phrase technology. Any transcriptional errors that result from this process are unintentional.

## 2017-09-05 DIAGNOSIS — M48062 Spinal stenosis, lumbar region with neurogenic claudication: Secondary | ICD-10-CM | POA: Insufficient documentation

## 2017-11-07 DIAGNOSIS — M48062 Spinal stenosis, lumbar region with neurogenic claudication: Secondary | ICD-10-CM | POA: Diagnosis not present

## 2017-11-07 DIAGNOSIS — I10 Essential (primary) hypertension: Secondary | ICD-10-CM | POA: Diagnosis not present

## 2017-11-17 ENCOUNTER — Other Ambulatory Visit: Payer: Self-pay | Admitting: Internal Medicine

## 2017-11-17 ENCOUNTER — Other Ambulatory Visit: Payer: Self-pay | Admitting: Obstetrics and Gynecology

## 2017-11-17 DIAGNOSIS — Z1231 Encounter for screening mammogram for malignant neoplasm of breast: Secondary | ICD-10-CM

## 2017-12-12 ENCOUNTER — Ambulatory Visit
Admission: RE | Admit: 2017-12-12 | Discharge: 2017-12-12 | Disposition: A | Discharge status: Home / Self Care | Payer: PPO | Source: Ambulatory Visit | Attending: Internal Medicine | Admitting: Internal Medicine

## 2017-12-12 DIAGNOSIS — Z1231 Encounter for screening mammogram for malignant neoplasm of breast: Secondary | ICD-10-CM | POA: Diagnosis not present

## 2018-03-01 DIAGNOSIS — J449 Chronic obstructive pulmonary disease, unspecified: Secondary | ICD-10-CM | POA: Diagnosis not present

## 2018-03-01 DIAGNOSIS — I779 Disorder of arteries and arterioles, unspecified: Secondary | ICD-10-CM | POA: Diagnosis not present

## 2018-03-01 DIAGNOSIS — I10 Essential (primary) hypertension: Secondary | ICD-10-CM | POA: Diagnosis not present

## 2018-03-08 DIAGNOSIS — Z0000 Encounter for general adult medical examination without abnormal findings: Secondary | ICD-10-CM | POA: Diagnosis not present

## 2018-03-08 DIAGNOSIS — I779 Disorder of arteries and arterioles, unspecified: Secondary | ICD-10-CM | POA: Diagnosis not present

## 2018-03-08 DIAGNOSIS — G43709 Chronic migraine without aura, not intractable, without status migrainosus: Secondary | ICD-10-CM | POA: Diagnosis not present

## 2018-03-08 DIAGNOSIS — J449 Chronic obstructive pulmonary disease, unspecified: Secondary | ICD-10-CM | POA: Diagnosis not present

## 2018-03-08 DIAGNOSIS — I10 Essential (primary) hypertension: Secondary | ICD-10-CM | POA: Diagnosis not present

## 2018-03-08 DIAGNOSIS — I739 Peripheral vascular disease, unspecified: Secondary | ICD-10-CM | POA: Diagnosis not present

## 2018-03-27 ENCOUNTER — Encounter (INDEPENDENT_AMBULATORY_CARE_PROVIDER_SITE_OTHER): Payer: PPO | Admitting: Vascular Surgery

## 2018-03-29 DIAGNOSIS — I6521 Occlusion and stenosis of right carotid artery: Secondary | ICD-10-CM | POA: Diagnosis not present

## 2018-03-29 DIAGNOSIS — I779 Disorder of arteries and arterioles, unspecified: Secondary | ICD-10-CM | POA: Diagnosis not present

## 2018-03-29 DIAGNOSIS — I739 Peripheral vascular disease, unspecified: Secondary | ICD-10-CM | POA: Diagnosis not present

## 2018-04-03 ENCOUNTER — Telehealth (INDEPENDENT_AMBULATORY_CARE_PROVIDER_SITE_OTHER): Payer: Self-pay

## 2018-04-03 NOTE — Telephone Encounter (Signed)
I havent received any carotid ultrasound results. I cant make that decision without seeing the results.

## 2018-04-03 NOTE — Telephone Encounter (Signed)
Called nurse Amy back to let her know that the patient was given the first available appointment when we were first called, but he patient "No Showed" and then was given the next available at a different time. The nurse will be sending the notes over by fax again.

## 2018-04-03 NOTE — Telephone Encounter (Signed)
Jasmine Acosta called from [facility] to see if we had received the carotid study that she faxed over yesterday on the patient and to see if her waiting for the June 17 appointment would be too long for her too wait to be seen?  After looking at the patients chart, she was scheduled on June 4, but the patient No Showed, and she was given the next available appointment. Is she okay to wait until then?

## 2018-04-06 ENCOUNTER — Encounter (INDEPENDENT_AMBULATORY_CARE_PROVIDER_SITE_OTHER): Payer: Self-pay | Admitting: Vascular Surgery

## 2018-04-06 ENCOUNTER — Ambulatory Visit (INDEPENDENT_AMBULATORY_CARE_PROVIDER_SITE_OTHER): Payer: PPO | Admitting: Vascular Surgery

## 2018-04-06 VITALS — BP 162/73 | HR 71 | Resp 16 | Ht 61.0 in | Wt 114.0 lb

## 2018-04-06 DIAGNOSIS — E785 Hyperlipidemia, unspecified: Secondary | ICD-10-CM | POA: Diagnosis not present

## 2018-04-06 DIAGNOSIS — Z720 Tobacco use: Secondary | ICD-10-CM | POA: Diagnosis not present

## 2018-04-06 DIAGNOSIS — I10 Essential (primary) hypertension: Secondary | ICD-10-CM | POA: Diagnosis not present

## 2018-04-06 DIAGNOSIS — I700 Atherosclerosis of aorta: Secondary | ICD-10-CM | POA: Insufficient documentation

## 2018-04-06 DIAGNOSIS — I6523 Occlusion and stenosis of bilateral carotid arteries: Secondary | ICD-10-CM | POA: Diagnosis not present

## 2018-04-06 DIAGNOSIS — I70219 Atherosclerosis of native arteries of extremities with intermittent claudication, unspecified extremity: Secondary | ICD-10-CM | POA: Insufficient documentation

## 2018-04-06 DIAGNOSIS — I739 Peripheral vascular disease, unspecified: Secondary | ICD-10-CM | POA: Diagnosis not present

## 2018-04-06 NOTE — Progress Notes (Signed)
Patient ID: Jasmine Acosta, female   DOB: March 27, 1951, 67 y.o.   MRN: [id number]  Chief Complaint  Patient presents with  . New Patient (Initial Visit)    ref Ouida Sills for claudication    HPI Jasmine Acosta is a 67 y.o. female.  I am asked to see the patient by Dr. Ouida Sills for evaluation of carotid disease.  The patient reports she had been complaining of neck pain for some time.  She reports no known history of stroke or TIA.  She denies any recent focal neurologic symptoms. Specifically, the patient denies amaurosis fugax, speech or swallowing difficulties, or arm or leg weakness or numbness.  She underwent a recent carotid duplex which I have reviewed which demonstrates what is read as a radiographic string sign in the right carotid artery although her velocities are just into the high-grade range.  Her left carotid artery stenosis is consistent with a 50 to 69% stenosis by duplex criteria. Also, the patient complains of significant claudication symptoms in both lower extremities.  She says she can only walk very short distances before her legs cramp and give out.  Both legs are affected.  This has been gradually progressing over months to years.  There is no clear inciting event or causative factor that started the symptoms.  She does not have lower extremity ulcerations or infection.   Past Medical History:  Diagnosis Date  . COPD (chronic obstructive pulmonary disease) ([place]) 07/06/2015  . Headache   . Hypertension   . Peripheral vascular disease ([place])    Bilateral Carotid Artery Disease    Past Surgical History:  Procedure Laterality Date  . ABDOMINAL HYSTERECTOMY     heavy bleeding  . hallux vagus correction Right   . HALLUX VALGUS AUSTIN Left 07/01/2016   Procedure: HALLUX VALGUS AUSTIN left foot;  Surgeon: Sharlotte Alamo, DPM;  Location: [facility] ORS;  Service: Podiatry;  Laterality: Left;    Family History  Problem Relation Age of Onset  . Diabetes Mother   . Emphysema Father   .  Heart disease Sister   . Heart disease Brother   . Breast cancer Neg Hx   . Ovarian cancer Neg Hx   . Colon cancer Neg Hx      Social History Social History   Tobacco Use  . Smoking status: Current Every Day Smoker    Packs/day: 0.25    Types: Cigarettes  . Smokeless tobacco: Never Used  Substance Use Topics  . Alcohol use: No  . Drug use: No     Allergies  Allergen Reactions  . Codeine Nausea And Vomiting    Current Outpatient Medications  Medication Sig Dispense Refill  . amLODipine (NORVASC) 5 MG tablet   11  . aspirin EC 81 MG tablet Take by mouth.    . meloxicam (MOBIC) 15 MG tablet Take by mouth.    . Multiple Vitamins-Calcium (ONE-A-DAY WOMENS PO) Take 1 tablet by mouth daily.    . potassium chloride (K-DUR,KLOR-CON) 10 MEQ tablet Take 10 mEq by mouth 2 (two) times daily.    . rosuvastatin (CRESTOR) 40 MG tablet Take by mouth.    Marland Kitchen lisinopril-hydrochlorothiazide (PRINZIDE,ZESTORETIC) 20-25 MG tablet Take 1 tablet by mouth daily.    . simvastatin (ZOCOR) 20 MG tablet Take 20 mg by mouth daily at 6 PM.     No current facility-administered medications for this visit.       REVIEW OF SYSTEMS (Negative unless checked)  Constitutional: [] Weight loss  [] Fever  [] Chills  Cardiac: [] Chest pain   [] Chest pressure   [] Palpitations   [] Shortness of breath when laying flat   [] Shortness of breath at rest   [] Shortness of breath with exertion. Vascular:  [x] Pain in legs with walking   [x] Pain in legs at rest   [] Pain in legs when laying flat   [x] Claudication   [] Pain in feet when walking  [] Pain in feet at rest  [] Pain in feet when laying flat   [] History of DVT   [] Phlebitis   [] Swelling in legs   [] Varicose veins   [] Non-healing ulcers Pulmonary:   [] Uses home oxygen   [] Productive cough   [] Hemoptysis   [] Wheeze  [x] COPD   [] Asthma Neurologic:  [] Dizziness  [] Blackouts   [] Seizures   [] History of stroke   [] History of TIA  [] Aphasia   [] Temporary blindness   [] Dysphagia    [] Weakness or numbness in arms   [] Weakness or numbness in legs Musculoskeletal:  [x] Arthritis   [] Joint swelling   [x] Joint pain   [] Low back pain Hematologic:  [] Easy bruising  [] Easy bleeding   [] Hypercoagulable state   [] Anemic  [] Hepatitis Gastrointestinal:  [] Blood in stool   [] Vomiting blood  [x] Gastroesophageal reflux/heartburn   [] Abdominal pain Genitourinary:  [] Chronic kidney disease   [] Difficult urination  [] Frequent urination  [] Burning with urination   [] Hematuria Skin:  [] Rashes   [] Ulcers   [] Wounds Psychological:  [] History of anxiety   []  History of major depression.    Physical Exam BP (!) 162/73 (BP Location: Right Arm)   Pulse 71   Resp 16   Ht 5\' 1"  (1.549 m)   Wt 114 lb (51.7 kg)   BMI 21.54 kg/m  Gen:  WD/WN, NAD Head: [place]/AT, No temporalis wasting.  Ear/Nose/Throat: Hearing grossly intact, nares w/o erythema or drainage, oropharynx w/o Erythema/Exudate Eyes: Conjunctiva clear, sclera non-icteric  Neck: trachea midline.  Loud right carotid bruit.  Softer left carotid bruit Pulmonary:  Good air movement, clear to auscultation bilaterally.  Cardiac: RRR, normal S1, S2, no Murmurs, rubs or gallops. Vascular:  Vessel Right Left  Radial Palpable Palpable                          PT  not palpable  trace palpable  DP  1+ palpable  1+ palpable   Gastrointestinal: soft, non-tender/non-distended.  Musculoskeletal: M/S 5/5 throughout.  Extremities without ischemic changes.  No deformity or atrophy.  Trace lower extremity edema. Neurologic: Sensation grossly intact in extremities.  Symmetrical.  Speech is fluent. Motor exam as listed above. Psychiatric: Judgment intact, Mood & affect appropriate for pt's clinical situation. Dermatologic: No rashes or ulcers noted.  No cellulitis or open wounds.  Radiology No results found.  Labs No results found for this or any previous visit (from the past 2160 hour(s)).  Assessment/Plan:  Essential hypertension blood  pressure control important in reducing the progression of atherosclerotic disease. On appropriate oral medications.   Hyperlipidemia lipid control important in reducing the progression of atherosclerotic disease. Continue statin therapy   Tobacco user We had a discussion for approximately 3 minutes regarding the absolute need for smoking cessation due to the deleterious nature of tobacco on the vascular system. We discussed the tobacco use would diminish patency of any intervention, and likely significantly worsen progressio of disease. We discussed multiple agents for quitting including replacement therapy or medications to reduce cravings such as Chantix. The patient voices their understanding of the importance of smoking cessation.  Claudication [facility]) Recommend:  Patient should undergo non-invasive studies on the legs because there has been a significant deterioration in the patient's lower extremity symptoms.  The patient states they are having increased pain and a marked decrease in the distance that they can walk. This will be delayed until after her carotid work up as that is more critical and pressing.  The risks and benefits as well as the alternatives were discussed in detail with the patient.  All questions were answered.  Patient agrees to proceed and understands this could be a prelude to angiography and intervention.  The patient will follow up with me in the office to review the studies.   Bilateral carotid artery disease ([place]) She underwent a recent carotid duplex which I have reviewed which demonstrates what is read as a radiographic string sign in the right carotid artery although her velocities are just into the high-grade range.  Her left carotid artery stenosis is consistent with a 50 to 69% stenosis by duplex criteria. She is on aspirin and a statin agent  The patient remains asymptomatic with respect to the carotid stenosis.  However, the patient has now progressed and  has a lesion the is >70%.  Patient should undergo CT angiography of the carotid arteries to define the degree of stenosis of the internal carotid arteries bilaterally and the anatomic suitability for surgery vs. intervention.  If the patient does indeed need surgery cardiac clearance will be required, once cleared the patient will be scheduled for surgery.  The risks, benefits and alternative therapies were reviewed in detail with the patient.  All questions were answered.  The patient agrees to proceed with imaging.  Continue antiplatelet therapy as prescribed. Continue management of CAD, HTN and Hyperlipidemia. Healthy heart diet, encouraged exercise at least 4 times per week.        Leotis Pain 04/06/2018, 11:54 AM   This note was created with Dragon medical transcription system.  Any errors from dictation are unintentional.

## 2018-04-06 NOTE — Assessment & Plan Note (Signed)
lipid control important in reducing the progression of atherosclerotic disease. Continue statin therapy  

## 2018-04-06 NOTE — Assessment & Plan Note (Signed)
Recommend:  Patient should undergo non-invasive studies on the legs because there has been a significant deterioration in the patient's lower extremity symptoms.  The patient states they are having increased pain and a marked decrease in the distance that they can walk. This will be delayed until after her carotid work up as that is more critical and pressing.  The risks and benefits as well as the alternatives were discussed in detail with the patient.  All questions were answered.  Patient agrees to proceed and understands this could be a prelude to angiography and intervention.  The patient will follow up with me in the office to review the studies.

## 2018-04-06 NOTE — Assessment & Plan Note (Signed)

## 2018-04-06 NOTE — Assessment & Plan Note (Signed)
blood pressure control important in reducing the progression of atherosclerotic disease. On appropriate oral medications.  

## 2018-04-06 NOTE — Patient Instructions (Signed)
Carotid Artery Disease The carotid arteries are arteries on both sides of the neck. They carry blood to the brain. Carotid artery disease is when the arteries get smaller (narrow) or get blocked. If these arteries get smaller or get blocked, you are more likely to have a stroke or warning stroke (transient ischemic attack). Follow these instructions at home:  Take medicines as told by your doctor. Make sure you understand all your medicine instructions. Do not stop your medicines without talking to your doctor first.  Follow your doctor's diet instructions. It is important to eat a healthy diet that includes plenty of: ? Fresh fruits. ? Vegetables. ? Lean meats.  Avoid: ? High-fat foods. ? High-sodium foods. ? Foods that are fried, overly processed, or have poor nutritional value.  Stay a healthy weight.  Stay active. Get at least 30 minutes of activity every day.  Do not smoke.  Limit alcohol use to: ? No more than 2 drinks a day for men. ? No more than 1 drink a day for women who are not pregnant.  Do not use illegal drugs.  Keep all doctor visits as told. Get help right away if:  You have sudden weakness or loss of feeling (numbness) on one side of the body, such as the face, arm, or leg.  You have sudden confusion.  You have trouble speaking (aphasia) or understanding.  You have sudden trouble seeing out of one or both eyes.  You have sudden trouble walking.  You have dizziness or feel like you might pass out (faint).  You have a loss of balance or your movements are not steady (uncoordinated).  You have a sudden, severe headache with no known cause.  You have trouble swallowing (dysphagia). Call your local emergency services (911 in [place] Do notdrive yourself to the clinic or hospital. This information is not intended to replace advice given to you by your health care provider. Make sure you discuss any questions you have with your health care  provider. Document Released: 09/26/2012 Document Revised: 03/17/2016 Document Reviewed: 04/10/2013 Elsevier Interactive Patient Education  2018 [employer]  

## 2018-04-06 NOTE — Assessment & Plan Note (Signed)
She underwent a recent carotid duplex which I have reviewed which demonstrates what is read as a radiographic string sign in the right carotid artery although her velocities are just into the high-grade range.  Her left carotid artery stenosis is consistent with a 50 to 69% stenosis by duplex criteria. She is on aspirin and a statin agent  The patient remains asymptomatic with respect to the carotid stenosis.  However, the patient has now progressed and has a lesion the is >70%.  Patient should undergo CT angiography of the carotid arteries to define the degree of stenosis of the internal carotid arteries bilaterally and the anatomic suitability for surgery vs. intervention.  If the patient does indeed need surgery cardiac clearance will be required, once cleared the patient will be scheduled for surgery.  The risks, benefits and alternative therapies were reviewed in detail with the patient.  All questions were answered.  The patient agrees to proceed with imaging.  Continue antiplatelet therapy as prescribed. Continue management of CAD, HTN and Hyperlipidemia. Healthy heart diet, encouraged exercise at least 4 times per week.

## 2018-04-09 ENCOUNTER — Encounter (INDEPENDENT_AMBULATORY_CARE_PROVIDER_SITE_OTHER): Payer: PPO | Admitting: Vascular Surgery

## 2018-04-09 ENCOUNTER — Other Ambulatory Visit (INDEPENDENT_AMBULATORY_CARE_PROVIDER_SITE_OTHER): Payer: Self-pay | Admitting: Vascular Surgery

## 2018-04-09 DIAGNOSIS — I6523 Occlusion and stenosis of bilateral carotid arteries: Secondary | ICD-10-CM

## 2018-04-12 ENCOUNTER — Encounter (INDEPENDENT_AMBULATORY_CARE_PROVIDER_SITE_OTHER): Payer: Self-pay

## 2018-04-12 ENCOUNTER — Ambulatory Visit
Admission: RE | Admit: 2018-04-12 | Discharge: 2018-04-12 | Disposition: A | Discharge status: Home / Self Care | Payer: PPO | Source: Ambulatory Visit | Attending: Vascular Surgery | Admitting: Vascular Surgery

## 2018-04-12 DIAGNOSIS — I708 Atherosclerosis of other arteries: Secondary | ICD-10-CM | POA: Insufficient documentation

## 2018-04-12 DIAGNOSIS — J439 Emphysema, unspecified: Secondary | ICD-10-CM | POA: Diagnosis not present

## 2018-04-12 DIAGNOSIS — I6523 Occlusion and stenosis of bilateral carotid arteries: Secondary | ICD-10-CM | POA: Diagnosis not present

## 2018-04-12 DIAGNOSIS — E042 Nontoxic multinodular goiter: Secondary | ICD-10-CM | POA: Insufficient documentation

## 2018-04-12 MED ORDER — IOPAMIDOL (ISOVUE-370) INJECTION 76%
75.0000 mL | Freq: Once | INTRAVENOUS | Status: AC | PRN
  Administered 2018-04-12: 75 mL via INTRAVENOUS

## 2018-04-13 DIAGNOSIS — I739 Peripheral vascular disease, unspecified: Secondary | ICD-10-CM | POA: Diagnosis not present

## 2018-04-13 DIAGNOSIS — R0602 Shortness of breath: Secondary | ICD-10-CM | POA: Diagnosis not present

## 2018-04-13 DIAGNOSIS — I208 Other forms of angina pectoris: Secondary | ICD-10-CM | POA: Diagnosis not present

## 2018-04-13 DIAGNOSIS — R011 Cardiac murmur, unspecified: Secondary | ICD-10-CM | POA: Diagnosis not present

## 2018-04-13 DIAGNOSIS — G43709 Chronic migraine without aura, not intractable, without status migrainosus: Secondary | ICD-10-CM | POA: Diagnosis not present

## 2018-04-13 DIAGNOSIS — I10 Essential (primary) hypertension: Secondary | ICD-10-CM | POA: Diagnosis not present

## 2018-04-13 DIAGNOSIS — J449 Chronic obstructive pulmonary disease, unspecified: Secondary | ICD-10-CM | POA: Diagnosis not present

## 2018-04-13 DIAGNOSIS — I779 Disorder of arteries and arterioles, unspecified: Secondary | ICD-10-CM | POA: Diagnosis not present

## 2018-04-13 DIAGNOSIS — Z01818 Encounter for other preprocedural examination: Secondary | ICD-10-CM | POA: Diagnosis not present

## 2018-04-16 ENCOUNTER — Ambulatory Visit (INDEPENDENT_AMBULATORY_CARE_PROVIDER_SITE_OTHER): Payer: PPO | Admitting: Vascular Surgery

## 2018-04-17 ENCOUNTER — Encounter (INDEPENDENT_AMBULATORY_CARE_PROVIDER_SITE_OTHER): Payer: Self-pay | Admitting: Vascular Surgery

## 2018-04-17 ENCOUNTER — Ambulatory Visit (INDEPENDENT_AMBULATORY_CARE_PROVIDER_SITE_OTHER): Payer: PPO | Admitting: Vascular Surgery

## 2018-04-17 VITALS — BP 138/82 | HR 72 | Resp 14 | Ht 61.0 in | Wt 112.0 lb

## 2018-04-17 DIAGNOSIS — I6523 Occlusion and stenosis of bilateral carotid arteries: Secondary | ICD-10-CM | POA: Diagnosis not present

## 2018-04-17 DIAGNOSIS — I10 Essential (primary) hypertension: Secondary | ICD-10-CM | POA: Diagnosis not present

## 2018-04-17 DIAGNOSIS — E785 Hyperlipidemia, unspecified: Secondary | ICD-10-CM

## 2018-04-17 NOTE — Progress Notes (Signed)
MRN : [id number]  Jasmine Acosta is a 67 y.o. (1951-05-29) female who presents with chief complaint of  Chief Complaint  Patient presents with  . Follow-up    Ultrasound results-Carotid  .  History of Present Illness: Patient returns today in follow up of her carotid disease and PAD.  She has undergone a CT angiogram of the carotid arteries which I have independently reviewed.  The official interpretation is of a highly calcified 70% right ICA stenosis and a 40% left ICA stenosis.  This is a clear underestimate of the degree of stenosis on the right by my review and interpreted as closer to 80 to 85%.  Past Medical History:  Diagnosis Date  . COPD (chronic obstructive pulmonary disease) ([place]) 07/06/2015  . Headache   . Hypertension   . Peripheral vascular disease ([place])    Bilateral Carotid Artery Disease         Past Surgical History:  Procedure Laterality Date  . ABDOMINAL HYSTERECTOMY     heavy bleeding  . hallux vagus correction Right   . HALLUX VALGUS AUSTIN Left 07/01/2016   Procedure: HALLUX VALGUS AUSTIN left foot;  Surgeon: Sharlotte Alamo, DPM;  Location: [facility] ORS;  Service: Podiatry;  Laterality: Left;         Family History  Problem Relation Age of Onset  . Diabetes Mother   . Emphysema Father   . Heart disease Sister   . Heart disease Brother   . Breast cancer Neg Hx   . Ovarian cancer Neg Hx   . Colon cancer Neg Hx      Social History Social History        Tobacco Use  . Smoking status: Current Every Day Smoker    Packs/day: 0.25    Types: Cigarettes  . Smokeless tobacco: Never Used  Substance Use Topics  . Alcohol use: No  . Drug use: No     Allergies  Allergen Reactions  . Codeine Nausea And Vomiting          Current Outpatient Medications  Medication Sig Dispense Refill  . amLODipine (NORVASC) 5 MG tablet   11  . aspirin EC 81 MG tablet Take by mouth.    . meloxicam (MOBIC) 15 MG tablet Take by mouth.     . Multiple Vitamins-Calcium (ONE-A-DAY WOMENS PO) Take 1 tablet by mouth daily.    . potassium chloride (K-DUR,KLOR-CON) 10 MEQ tablet Take 10 mEq by mouth 2 (two) times daily.    . rosuvastatin (CRESTOR) 40 MG tablet Take by mouth.    Marland Kitchen lisinopril-hydrochlorothiazide (PRINZIDE,ZESTORETIC) 20-25 MG tablet Take 1 tablet by mouth daily.    . simvastatin (ZOCOR) 20 MG tablet Take 20 mg by mouth daily at 6 PM.     No current facility-administered medications for this visit.       REVIEW OF SYSTEMS (Negative unless checked)  Constitutional: [] Weight loss  [] Fever  [] Chills Cardiac: [] Chest pain   [] Chest pressure   [] Palpitations   [] Shortness of breath when laying flat   [] Shortness of breath at rest   [] Shortness of breath with exertion. Vascular:  [x] Pain in legs with walking   [x] Pain in legs at rest   [] Pain in legs when laying flat   [x] Claudication   [] Pain in feet when walking  [] Pain in feet at rest  [] Pain in feet when laying flat   [] History of DVT   [] Phlebitis   [] Swelling in legs   [] Varicose veins   [] Non-healing ulcers Pulmonary:   []   Uses home oxygen   [] Productive cough   [] Hemoptysis   [] Wheeze  [x] COPD   [] Asthma Neurologic:  [] Dizziness  [] Blackouts   [] Seizures   [] History of stroke   [] History of TIA  [] Aphasia   [] Temporary blindness   [] Dysphagia   [] Weakness or numbness in arms   [] Weakness or numbness in legs Musculoskeletal:  [x] Arthritis   [] Joint swelling   [x] Joint pain   [] Low back pain Hematologic:  [] Easy bruising  [] Easy bleeding   [] Hypercoagulable state   [] Anemic  [] Hepatitis Gastrointestinal:  [] Blood in stool   [] Vomiting blood  [x] Gastroesophageal reflux/heartburn   [] Abdominal pain Genitourinary:  [] Chronic kidney disease   [] Difficult urination  [] Frequent urination  [] Burning with urination   [] Hematuria Skin:  [] Rashes   [] Ulcers   [] Wounds Psychological:  [] History of anxiety   []  History of major depression.      Physical  Examination  BP 138/82 (BP Location: Left Arm)   Pulse 72   Resp 14   Ht 5\' 1"  (1.549 m)   Wt 112 lb (50.8 kg)   BMI 21.16 kg/m  Gen:  WD/WN, NAD Head: [place]/AT, No temporalis wasting. Ear/Nose/Throat: Hearing grossly intact, nares w/o erythema or drainage Eyes: Conjunctiva clear. Sclera non-icteric Neck: Supple.  Trachea midline Pulmonary:  Good air movement, no use of accessory muscles.  Cardiac: RRR, no JVD Vascular:  Vessel Right Left  Radial Palpable Palpable                                   Gastrointestinal: soft, non-tender/non-distended. No guarding/reflex.  Musculoskeletal: M/S 5/5 throughout.  No deformity or atrophy. No edema. Neurologic: Sensation grossly intact in extremities.  Symmetrical.  Speech is fluent.  Psychiatric: Judgment intact, Mood & affect appropriate for pt's clinical situation. Dermatologic: No rashes or ulcers noted.  No cellulitis or open wounds.       Labs No results found for this or any previous visit (from the past 2160 hour(s)).  Radiology Ct Angio Neck W Or Wo Contrast  Result Date: 04/12/2018 CLINICAL DATA:  Bilateral carotid stenosis EXAM: CT ANGIOGRAPHY NECK TECHNIQUE: Multidetector CT imaging of the neck was performed using the standard protocol during bolus administration of intravenous contrast. Multiplanar CT image reconstructions and MIPs were obtained to evaluate the vascular anatomy. Carotid stenosis measurements (when applicable) are obtained utilizing NASCET criteria, using the distal internal carotid diameter as the denominator. CONTRAST:  82mL ISOVUE-370 IOPAMIDOL (ISOVUE-370) INJECTION 76% COMPARISON:  Carotid Doppler 03/29/2018 FINDINGS: Aortic arch: Atherosclerotic calcification throughout the aortic arch. Ascending aorta 31 mm in diameter. Atherosclerotic calcification proximal great vessels without significant stenosis. Right carotid system: Atherosclerotic calcification throughout the right common carotid artery  without significant stenosis. Heavily calcified plaque right carotid bifurcation narrowing the lumen to 1.5 mm corresponding to 70% diameter stenosis. External carotid artery mildly narrowed at the origin Left carotid system: Mild atherosclerotic calcification left common carotid artery. Atherosclerotic calcification proximal left internal carotid artery narrowing the lumen by 40% diameter stenosis. Mild stenosis origin of the external carotid artery. Vertebral arteries: Calcific plaque origin of left vertebral artery with moderate to severe stenosis. Remainder left vertebral artery is patent to the basilar. Right vertebral artery widely patent without stenosis. Skeleton: No acute abnormality. Other neck: Thyroid is diffusely enlarged bilaterally with heterogeneous multiple nodules. Findings compatible with multinodular goiter. No cervical adenopathy Upper chest: COPD with emphysema bilaterally. No focal infiltrate or mass in the apices. IMPRESSION:  Heavily calcified plaque right carotid bifurcation narrowing the lumen by 70% diameter stenosis of the right internal carotid artery 40% diameter stenosis left internal carotid artery due to calcific plaque Moderate to severe stenosis origin of left vertebral artery due to calcific plaque. Multinodular goiter COPD with emphysema Electronically Signed   By: Franchot Gallo M.D.   On: 04/12/2018 16:10    Assessment/Plan Essential hypertension blood pressure control important in reducing the progression of atherosclerotic disease. On appropriate oral medications.   Hyperlipidemia lipid control important in reducing the progression of atherosclerotic disease. Continue statin therapy   Bilateral carotid artery disease ([place]) She has undergone a CT angiogram of the carotid arteries which I have independently reviewed.  The official interpretation is of a highly calcified 70% right ICA stenosis and a 40% left ICA stenosis.  This is a clear underestimate of the  degree of stenosis on the right by my review and interpreted as closer to 80 to 85%.  Recommend:  The patient remains asymptomatic with respect to the carotid stenosis.  However, the patient has now progressed and has a lesion the is at least 70%.  Patient's CT angiography of the carotid arteries confirms at least 70% Right ICA stenosis.  The anatomical considerations support surgery over stenting.  This was discussed in detail with the patient.  The patient does indeed need surgery, therefore, cardiac clearance will be arranged. Once cleared the patient will be scheduled for surgery.  The risks, benefits and alternative therapies were reviewed in detail with the patient.  All questions were answered.  The patient agrees to proceed with surgery of the right carotid artery.  Continue antiplatelet therapy as prescribed. Continue management of CAD, HTN and Hyperlipidemia. Healthy heart diet, encouraged exercise at least 4 times per week.      Leotis Pain, MD  04/17/2018 12:03 PM    This note was created with Dragon medical transcription system.  Any errors from dictation are purely unintentional

## 2018-04-17 NOTE — Patient Instructions (Signed)
Carotid Endarterectomy, Care After °This sheet gives you information about how to care for yourself after your procedure. Your health care provider may also give you more specific instructions. If you have problems or questions, contact your health care provider. °What can I expect after the procedure? °After the procedure, it is common to have some pain or an ache in your neck for up to 2 weeks. This is normal. °Follow these instructions at home: °Incision care °· Follow instructions from your health care provider about how to take care of your incision. Make sure you: °? Wash your hands with soap and water before you change your bandage (dressing). If soap and water are not available, use hand sanitizer. °? Change your dressing as told by your health care provider. °? Leave stitches (sutures), skin glue, or adhesive strips in place. These skin closures may need to stay in place for 2 weeks or longer. If adhesive strip edges start to loosen and curl up, you may trim the loose edges. Do not remove adhesive strips completely unless your health care provider tells you to do that. °· Check your incision area every day for signs of infection. Check for: °? Redness, swelling, or pain. °? Fluid or blood. °? Warmth. °? Pus or a bad smell. °· Do not take baths, swim, or use a hot tub until your health care provider approves. Ask your health care provider if you can take showers. °Medicines °· Take over-the-counter and prescription medicines only as told by your health care provider. °· If you get a blood thinner (anticoagulant) medicine after surgery, take it exactly as directed. °· Do not drive for 24 hours if you were given a sedative. °· Do not drive or use heavy machinery while taking prescription pain medicine. °Activity °· Do not lift anything that is heavier than 10 lb (4.5 kg), or the limit that your health care provider tells you, until he or she says that it is safe. °· Exercise regularly or as told by your health  care provider. °· Return to your normal activities as told by your health care provider. Ask your health care provider what activities are safe for you. °? Recovery time varies depending on your age, general health, and other factors. You will likely be able to return to a normal lifestyle within a few weeks. While you recover, you may need help with some activities such as cleaning the house or shopping. °General instructions °· Do not use any products that contain nicotine or tobacco, such as cigarettes and e-cigarettes. If you need help quitting, ask your health care provider. °· Drink enough fluid to keep your urine clear or pale yellow. °· Take steps to control your blood pressure. °· Work to reach and maintain a healthy body weight. °· Eat a heart-healthy diet. Talk with your health care provider about how to lower blood lipids (cholesterol and triglycerides). °· Avoid wearing tight clothing around your neck and your stitches. °· Keep all follow-up visits as told by your health care provider. This is important. °Contact a health care provider if: °· You have signs of infection, such as: °? Redness, swelling, or pain around your incision. °? Fluid or blood coming from your incision. °? Your incision feeling warm to the touch. °? Pus or a bad smell coming from your incision. °? A fever. °· You develop a rash. °· You have difficulty speaking or you have changes in your voice. °Get help right away if: °· You have difficulty breathing. °·   You have chest pain or shortness of breath. °· You suddenly develop any of these symptoms: °? Weakness or numbness in your face, arm, or leg, especially on one side of your body. °? Confusion. °? Trouble speaking, understanding, or both. °? Trouble seeing with one or both eyes. °? A severe headache with no known cause. °· You have a seizure. °Summary °· After the procedure, it is common to have some pain or an ache in your neck for up to 2 weeks. °· Follow instructions from your  health care provider about how to take care of your incision. °· Keep all follow-up visits as told by your health care provider. This is important. °This information is not intended to replace advice given to you by your health care provider. Make sure you discuss any questions you have with your health care provider. °Document Released: 04/29/2005 Document Revised: 08/29/2016 Document Reviewed: 08/29/2016 °Elsevier Interactive Patient Education © 2017 [employer] ° °

## 2018-04-17 NOTE — Assessment & Plan Note (Signed)
She has undergone a CT angiogram of the carotid arteries which I have independently reviewed.  The official interpretation is of a highly calcified 70% right ICA stenosis and a 40% left ICA stenosis.  This is a clear underestimate of the degree of stenosis on the right by my review and interpreted as closer to 80 to 85%.  Recommend:  The patient remains asymptomatic with respect to the carotid stenosis.  However, the patient has now progressed and has a lesion the is at least 70%.  Patient's CT angiography of the carotid arteries confirms at least 70% Right ICA stenosis.  The anatomical considerations support surgery over stenting.  This was discussed in detail with the patient.  The patient does indeed need surgery, therefore, cardiac clearance will be arranged. Once cleared the patient will be scheduled for surgery.  The risks, benefits and alternative therapies were reviewed in detail with the patient.  All questions were answered.  The patient agrees to proceed with surgery of the right carotid artery.  Continue antiplatelet therapy as prescribed. Continue management of CAD, HTN and Hyperlipidemia. Healthy heart diet, encouraged exercise at least 4 times per week.

## 2018-04-30 ENCOUNTER — Encounter (INDEPENDENT_AMBULATORY_CARE_PROVIDER_SITE_OTHER): Payer: Self-pay | Admitting: Vascular Surgery

## 2018-04-30 DIAGNOSIS — I208 Other forms of angina pectoris: Secondary | ICD-10-CM | POA: Diagnosis not present

## 2018-04-30 DIAGNOSIS — R0602 Shortness of breath: Secondary | ICD-10-CM | POA: Diagnosis not present

## 2018-04-30 DIAGNOSIS — Z01818 Encounter for other preprocedural examination: Secondary | ICD-10-CM | POA: Diagnosis not present

## 2018-04-30 DIAGNOSIS — R011 Cardiac murmur, unspecified: Secondary | ICD-10-CM | POA: Diagnosis not present

## 2018-05-03 ENCOUNTER — Telehealth (INDEPENDENT_AMBULATORY_CARE_PROVIDER_SITE_OTHER): Payer: Self-pay | Admitting: Vascular Surgery

## 2018-05-03 NOTE — Telephone Encounter (Signed)
Spoke with the patient and let her know that I am waiting on Dr. Etta Quill office to clear her for surgery.

## 2018-05-03 NOTE — Telephone Encounter (Signed)
Patient called back wanting to go ahead and get her surgery scheduled. She can be reached at number in the system. Thanks!

## 2018-05-04 ENCOUNTER — Telehealth (INDEPENDENT_AMBULATORY_CARE_PROVIDER_SITE_OTHER): Payer: Self-pay | Admitting: Vascular Surgery

## 2018-05-04 NOTE — Telephone Encounter (Signed)
I spoke with the patient yesterday and explained that as soon as Dr. Etta Quill office fax over the clearance I will contact her to schedule her surgery.

## 2018-05-16 ENCOUNTER — Ambulatory Visit (INDEPENDENT_AMBULATORY_CARE_PROVIDER_SITE_OTHER): Payer: PPO | Admitting: Vascular Surgery

## 2018-05-16 ENCOUNTER — Encounter

## 2018-05-16 ENCOUNTER — Encounter (INDEPENDENT_AMBULATORY_CARE_PROVIDER_SITE_OTHER): Payer: Self-pay | Admitting: Vascular Surgery

## 2018-05-16 ENCOUNTER — Ambulatory Visit (INDEPENDENT_AMBULATORY_CARE_PROVIDER_SITE_OTHER): Payer: PPO

## 2018-05-16 VITALS — BP 134/79 | HR 80 | Resp 13 | Ht 61.0 in | Wt 110.0 lb

## 2018-05-16 DIAGNOSIS — E785 Hyperlipidemia, unspecified: Secondary | ICD-10-CM

## 2018-05-16 DIAGNOSIS — I6523 Occlusion and stenosis of bilateral carotid arteries: Secondary | ICD-10-CM | POA: Diagnosis not present

## 2018-05-16 DIAGNOSIS — I739 Peripheral vascular disease, unspecified: Secondary | ICD-10-CM | POA: Diagnosis not present

## 2018-05-16 DIAGNOSIS — Z720 Tobacco use: Secondary | ICD-10-CM | POA: Diagnosis not present

## 2018-05-16 NOTE — Progress Notes (Signed)
Subjective:    Patient ID: Jasmine Acosta, female    DOB: 05-03-1951, 67 y.o.   MRN: [id number] Chief Complaint  Patient presents with  . Follow-up    ABI f/u   Patient presents to review vascular studies.  We are currently awaiting cardiac clearance for the patient to undergo a carotid endarterectomy.  The patient has been experiencing bilateral calf claudication with long distances.  Her symptoms are stable and have not worsened.  The patient denies any rest pain or ulcer formation to the bilateral lower extremity.  The patient underwent a bilateral ABI which was notable for left 1.03 - resting left ankle brachial index is been normal range.  No evidence of significant left lower extremity arterial disease.  The toe brachial index is normal.  Right: 0.84 - right ankle-brachial ended at 6 indicates mild right lower extremity arterial disease.  The right toe brachial index is abnormal.  Biphasic tibial waveforms.  Patient does mention that she does suffer from a "sciatic nerve issue".  Patient denies any fever, nausea vomiting.  Review of Systems  Constitutional: Negative.   HENT: Negative.   Eyes: Negative.   Respiratory: Negative.   Cardiovascular: Negative.   Gastrointestinal: Negative.   Endocrine: Negative.   Genitourinary: Negative.   Musculoskeletal: Negative.   Skin: Negative.   Allergic/Immunologic: Negative.   Neurological: Negative.   Hematological: Negative.   Psychiatric/Behavioral: Negative.       Objective:   Physical Exam  Constitutional: She is oriented to person, place, and time. She appears well-developed and well-nourished. No distress.  HENT:  Head: Normocephalic.  Right Ear: External ear normal.  Left Ear: External ear normal.  Eyes: Pupils are equal, round, and reactive to light. Conjunctivae and EOM are normal.  Neck: Normal range of motion.  Cardiovascular: Normal rate, regular rhythm, normal heart sounds and intact distal pulses.  Pulses:      Radial  pulses are 2+ on the right side, and 2+ on the left side.       Dorsalis pedis pulses are 2+ on the right side, and 2+ on the left side.       Posterior tibial pulses are 2+ on the right side, and 2+ on the left side.  Pulmonary/Chest: Effort normal and breath sounds normal.  Musculoskeletal: Normal range of motion. She exhibits no edema.  Neurological: She is alert and oriented to person, place, and time.  Skin: Skin is warm and dry. She is not diaphoretic.  Psychiatric: She has a normal mood and affect. Her behavior is normal. Judgment and thought content normal.  Vitals reviewed.  BP 134/79 (BP Location: Right Arm, Patient Position: Sitting)   Pulse 80   Resp 13   Ht 5\' 1"  (1.549 m)   Wt 110 lb (49.9 kg)   BMI 20.78 kg/m   Past Medical History:  Diagnosis Date  . Carotid artery occlusion   . COPD (chronic obstructive pulmonary disease) ([place]) 07/06/2015  . Headache   . Hypertension   . Peripheral vascular disease ([place])    Bilateral Carotid Artery Disease   Social History   Socioeconomic History  . Marital status: Single    Spouse name: Not on file  . Number of children: Not on file  . Years of education: Not on file  . Highest education level: Not on file  Occupational History  . Not on file  Social Needs  . Financial resource strain: Not on file  . Food insecurity:    Worry: Not  on file    Inability: Not on file  . Transportation needs:    Medical: Not on file    Non-medical: Not on file  Tobacco Use  . Smoking status: Current Every Day Smoker    Packs/day: 0.25    Types: Cigarettes  . Smokeless tobacco: Never Used  Substance and Sexual Activity  . Alcohol use: No  . Drug use: No  . Sexual activity: Not Currently    Birth control/protection: Surgical  Lifestyle  . Physical activity:    Days per week: Not on file    Minutes per session: Not on file  . Stress: Not on file  Relationships  . Social connections:    Talks on phone: Not on file    Gets  together: Not on file    Attends religious service: Not on file    Active member of club or organization: Not on file    Attends meetings of clubs or organizations: Not on file    Relationship status: Not on file  . Intimate partner violence:    Fear of current or ex partner: Not on file    Emotionally abused: Not on file    Physically abused: Not on file    Forced sexual activity: Not on file  Other Topics Concern  . Not on file  Social History Narrative  . Not on file   Past Surgical History:  Procedure Laterality Date  . ABDOMINAL HYSTERECTOMY     heavy bleeding  . hallux vagus correction Right   . HALLUX VALGUS AUSTIN Left 07/01/2016   Procedure: HALLUX VALGUS AUSTIN left foot;  Surgeon: Sharlotte Alamo, DPM;  Location: [facility] ORS;  Service: Podiatry;  Laterality: Left;   Family History  Problem Relation Age of Onset  . Diabetes Mother   . Emphysema Father   . Heart disease Sister   . Heart disease Brother   . Breast cancer Neg Hx   . Ovarian cancer Neg Hx   . Colon cancer Neg Hx    Allergies  Allergen Reactions  . Codeine Nausea And Vomiting  . Lisinopril     Other reaction(s): Angioedema Of tongue only      Assessment & Plan:  Patient presents to review vascular studies.  We are currently awaiting cardiac clearance for the patient to undergo a carotid endarterectomy.  The patient has been experiencing bilateral calf claudication with long distances.  Her symptoms are stable and have not worsened.  The patient denies any rest pain or ulcer formation to the bilateral lower extremity.  The patient underwent a bilateral ABI which was notable for left 1.03 - resting left ankle brachial index is been normal range.  No evidence of significant left lower extremity arterial disease.  The toe brachial index is normal.  Right: 0.84 - right ankle-brachial ended at 6 indicates mild right lower extremity arterial disease.  The right toe brachial index is abnormal.  Biphasic tibial waveforms.   Patient does mention that she does suffer from a "sciatic nerve issue".  Patient denies any fever, nausea vomiting.  1. Bilateral carotid artery stenosis - Stable Currently awaiting cardiac clearance. Once cardiac clearance is obtained we will move forward with a carotid endarterectomy  2. Tobacco user - Stable We had a discussion for approximately five minutes regarding the absolute need for smoking cessation due to the deleterious nature of tobacco on the vascular system. We discussed the tobacco use would diminish patency of any intervention, and likely significantly worsen progressio of disease.  We discussed multiple agents for quitting including replacement therapy or medications to reduce cravings such as Chantix. The patient voices their understanding of the importance of smoking cessation.  3. Hyperlipidemia, unspecified hyperlipidemia type - Stable Encouraged good control as its slows the progression of atherosclerotic disease  4. Claudication ([place]) - Stable Patient without any arterial disease to the left lower extremity Very mild disease to the right lower extremity I do not feel that the patient's bilateral calf claudication with long distances is stemming from an arterial issue.  The patient notes that she has not undergone any recent x-rays of her joints/spine to rule out any osteoarthritis or degenerative joint disease. Recommend speaking with her PCP in regard to ruling out OA or DDD Patient to follow-up in 1 year and undergo a surveillance ABI to continue to follow the patient's right lower extremity mild peripheral artery disease. I have discussed with the patient at length the risk factors for and pathogenesis of atherosclerotic disease and encouraged a healthy diet, regular exercise regimen and blood pressure / glucose control.  The patient was encouraged to call the office in the interim if he experiences any claudication like symptoms, rest pain or ulcers to his feet /  toes.  - VAS [place] ABI WITH/WO TBI; Future  Current Outpatient Medications on File Prior to Visit  Medication Sig Dispense Refill  . amLODipine (NORVASC) 5 MG tablet   11  . aspirin EC 81 MG tablet Take by mouth.    . gabapentin (NEURONTIN) 300 MG capsule Take by mouth.    Marland Kitchen lisinopril-hydrochlorothiazide (PRINZIDE,ZESTORETIC) 20-25 MG tablet Take 1 tablet by mouth daily.    . meloxicam (MOBIC) 15 MG tablet Take by mouth.    . Multiple Vitamins-Calcium (ONE-A-DAY WOMENS PO) Take 1 tablet by mouth daily.    . potassium chloride (K-DUR,KLOR-CON) 10 MEQ tablet Take 10 mEq by mouth 2 (two) times daily.    . rosuvastatin (CRESTOR) 40 MG tablet Take by mouth.    . simvastatin (ZOCOR) 20 MG tablet Take 20 mg by mouth daily at 6 PM.     No current facility-administered medications on file prior to visit.    There are no Patient Instructions on file for this visit. No follow-ups on file.  Lorence Nagengast A Arshan Jabs, PA-C

## 2018-05-25 ENCOUNTER — Telehealth (INDEPENDENT_AMBULATORY_CARE_PROVIDER_SITE_OTHER): Payer: Self-pay

## 2018-05-25 NOTE — Telephone Encounter (Signed)
Patient called asking when her surgery will be schedule and I inform her that we are waiting for cardiac clearance to be fax over so we can move forward with scheduling her surgery

## 2018-05-28 ENCOUNTER — Encounter (INDEPENDENT_AMBULATORY_CARE_PROVIDER_SITE_OTHER): Payer: Self-pay

## 2018-06-04 ENCOUNTER — Other Ambulatory Visit (INDEPENDENT_AMBULATORY_CARE_PROVIDER_SITE_OTHER): Payer: Self-pay | Admitting: Nurse Practitioner

## 2018-06-05 DIAGNOSIS — I739 Peripheral vascular disease, unspecified: Secondary | ICD-10-CM | POA: Diagnosis not present

## 2018-06-05 DIAGNOSIS — I779 Disorder of arteries and arterioles, unspecified: Secondary | ICD-10-CM | POA: Diagnosis not present

## 2018-06-05 DIAGNOSIS — I10 Essential (primary) hypertension: Secondary | ICD-10-CM | POA: Diagnosis not present

## 2018-06-07 ENCOUNTER — Encounter
Admission: RE | Admit: 2018-06-07 | Discharge: 2018-06-07 | Disposition: A | Discharge status: Home / Self Care | Payer: PPO | Source: Ambulatory Visit | Attending: Vascular Surgery | Admitting: Vascular Surgery

## 2018-06-07 ENCOUNTER — Other Ambulatory Visit: Payer: Self-pay

## 2018-06-07 DIAGNOSIS — Z01812 Encounter for preprocedural laboratory examination: Secondary | ICD-10-CM | POA: Insufficient documentation

## 2018-06-07 HISTORY — DX: Thyrotoxicosis, unspecified without thyrotoxic crisis or storm: E05.90

## 2018-06-07 LAB — SURGICAL PCR SCREEN
MRSA, PCR: NEGATIVE
Staphylococcus aureus: NEGATIVE

## 2018-06-07 LAB — TYPE AND SCREEN
ABO/RH(D): AB POS
Antibody Screen: NEGATIVE

## 2018-06-07 LAB — PROTIME-INR
INR: 1.03
Prothrombin Time: 13.4 seconds (ref 11.4–15.2)

## 2018-06-07 LAB — APTT: aPTT: 32 seconds (ref 24–36)

## 2018-06-07 NOTE — Patient Instructions (Signed)
Your procedure is scheduled on: Thurs. 8/22 Report to Day Surgery. To find out your arrival time please call [phone] between [place] on Wed. 8/21.  Remember: Instructions that are not followed completely may result in serious medical risk,  up to and including death, or upon the discretion of your surgeon and anesthesiologist your  surgery may need to be rescheduled.     _X__ 1. Do not eat food after midnight the night before your procedure.                 No gum chewing or hard candies. You may drink clear liquids up to 2 hours                 before you are scheduled to arrive for your surgery- DO not drink clear                 liquids within 2 hours of the start of your surgery.                 Clear Liquids include:  water, apple juice without pulp, clear carbohydrate                 drink such as Clearfast of Gatorade, Black Coffee or Tea (Do not add                 anything to coffee or tea).  __X__2.  On the morning of surgery brush your teeth with toothpaste and water, you                may rinse your mouth with mouthwash if you wish.  Do not swallow any toothpaste of mouthwash.     _X__ 3.  No Alcohol for 24 hours before or after surgery.   _X__ 4.  Do Not Smoke or use e-cigarettes For 24 Hours Prior to Your Surgery.                 Do not use any chewable tobacco products for at least 6 hours prior to                 surgery.  ____  5.  Bring all medications with you on the day of surgery if instructed.   _x___  6.  Notify your doctor if there is any change in your medical condition      (cold, fever, infections).     Do not wear jewelry, make-up, hairpins, clips or nail polish. Do not wear lotions, powders, or perfumes. You may wear deodorant. Do not shave 48 hours prior to surgery. Men may shave face and neck. Do not bring valuables to the hospital.    [facility] is not responsible for any belongings or valuables.  Contacts,  dentures or bridgework may not be worn into surgery. Leave your suitcase in the car. After surgery it may be brought to your room. For patients admitted to the hospital, discharge time is determined by your treatment team.   Patients discharged the day of surgery will not be allowed to drive home.   Please read over the following fact sheets that you were given:    _x___ Take these medicines the morning of surgery with A SIP OF WATER:    1. amLODipine (NORVASC) 5 MG tablet  2. gabapentin (NEURONTIN) 300 MG capsule  3. potassium chloride (K-DUR,KLOR-CON) 10 MEQ tablet  4.  5.  6.  ____ Fleet Enema (as directed)   _x___  Use CHG Soap as directed  ____ Use inhalers on the day of surgery  ____ Stop metformin 2 days prior to surgery    ____ Take 1/2 of usual insulin dose the night before surgery. No insulin the morning          of surgery.   ____ Stop Coumadin/Plavix/aspirin on   ____ Stop Anti-inflammatories on    ____ Stop supplements until after surgery.    ____ Bring C-Pap to the hospital.

## 2018-06-12 DIAGNOSIS — I779 Disorder of arteries and arterioles, unspecified: Secondary | ICD-10-CM | POA: Diagnosis not present

## 2018-06-12 DIAGNOSIS — I739 Peripheral vascular disease, unspecified: Secondary | ICD-10-CM | POA: Diagnosis not present

## 2018-06-12 DIAGNOSIS — G43709 Chronic migraine without aura, not intractable, without status migrainosus: Secondary | ICD-10-CM | POA: Diagnosis not present

## 2018-06-12 DIAGNOSIS — I10 Essential (primary) hypertension: Secondary | ICD-10-CM | POA: Diagnosis not present

## 2018-06-12 DIAGNOSIS — J449 Chronic obstructive pulmonary disease, unspecified: Secondary | ICD-10-CM | POA: Diagnosis not present

## 2018-06-13 MED ORDER — CEFAZOLIN SODIUM-DEXTROSE 2-4 GM/100ML-% IV SOLN
2.0000 g | INTRAVENOUS | Status: AC
  Administered 2018-06-14: 2 g via INTRAVENOUS

## 2018-06-14 ENCOUNTER — Inpatient Hospital Stay: Payer: PPO | Admitting: Anesthesiology

## 2018-06-14 ENCOUNTER — Encounter: Payer: Self-pay | Admitting: *Deleted

## 2018-06-14 ENCOUNTER — Encounter
Admission: RE | Disposition: A | Discharge status: Home / Self Care | Payer: Self-pay | Source: Ambulatory Visit | Attending: Vascular Surgery

## 2018-06-14 ENCOUNTER — Other Ambulatory Visit: Payer: Self-pay

## 2018-06-14 ENCOUNTER — Inpatient Hospital Stay
Admission: RE | Admit: 2018-06-14 | Discharge: 2018-06-15 | DRG: 039 | Disposition: A | Discharge status: Home / Self Care | Payer: PPO | Source: Ambulatory Visit | Attending: Vascular Surgery | Admitting: Vascular Surgery

## 2018-06-14 DIAGNOSIS — I6521 Occlusion and stenosis of right carotid artery: Secondary | ICD-10-CM | POA: Diagnosis not present

## 2018-06-14 DIAGNOSIS — I10 Essential (primary) hypertension: Secondary | ICD-10-CM | POA: Diagnosis present

## 2018-06-14 DIAGNOSIS — J449 Chronic obstructive pulmonary disease, unspecified: Secondary | ICD-10-CM | POA: Diagnosis not present

## 2018-06-14 DIAGNOSIS — E039 Hypothyroidism, unspecified: Secondary | ICD-10-CM | POA: Diagnosis present

## 2018-06-14 DIAGNOSIS — I739 Peripheral vascular disease, unspecified: Secondary | ICD-10-CM | POA: Diagnosis present

## 2018-06-14 DIAGNOSIS — E785 Hyperlipidemia, unspecified: Secondary | ICD-10-CM | POA: Diagnosis not present

## 2018-06-14 HISTORY — PX: ENDARTERECTOMY: SHX5162

## 2018-06-14 LAB — MRSA PCR SCREENING: MRSA by PCR: NEGATIVE

## 2018-06-14 LAB — ABO/RH: ABO/RH(D): AB POS

## 2018-06-14 SURGERY — ENDARTERECTOMY, CAROTID
Anesthesia: General | Site: Neck | Laterality: Right | Wound class: Clean

## 2018-06-14 MED ORDER — LIDOCAINE HCL (CARDIAC) PF 100 MG/5ML IV SOSY
PREFILLED_SYRINGE | INTRAVENOUS | Status: DC | PRN
  Administered 2018-06-14: 80 mg via INTRAVENOUS
  Administered 2018-06-14: 100 mg via INTRAVENOUS

## 2018-06-14 MED ORDER — FAMOTIDINE 20 MG PO TABS
ORAL_TABLET | ORAL | Status: AC
  Administered 2018-06-14: 20 mg via ORAL
  Filled 2018-06-14: qty 1

## 2018-06-14 MED ORDER — PHENYLEPHRINE HCL-NACL 10-0.9 MG/250ML-% IV SOLN
0.0000 ug/min | INTRAVENOUS | Status: DC
  Filled 2018-06-14: qty 250

## 2018-06-14 MED ORDER — MAGNESIUM SULFATE 2 GM/50ML IV SOLN: 2.0000 g | Freq: Every day | INTRAVENOUS | Status: DC | PRN

## 2018-06-14 MED ORDER — ONDANSETRON HCL 4 MG/2ML IJ SOLN
INTRAMUSCULAR | Status: DC | PRN
  Administered 2018-06-14: 4 mg via INTRAVENOUS

## 2018-06-14 MED ORDER — LIDOCAINE HCL 1 % IJ SOLN
INTRAMUSCULAR | Status: DC | PRN
  Administered 2018-06-14: 10 mL

## 2018-06-14 MED ORDER — AMLODIPINE BESYLATE 5 MG PO TABS
5.0000 mg | ORAL_TABLET | Freq: Every day | ORAL | Status: DC
  Filled 2018-06-14: qty 1

## 2018-06-14 MED ORDER — LACTATED RINGERS IV SOLN
INTRAVENOUS | Status: DC
  Administered 2018-06-14: 11:00:00 via INTRAVENOUS

## 2018-06-14 MED ORDER — LABETALOL HCL 5 MG/ML IV SOLN: 10.0000 mg | INTRAVENOUS | Status: DC | PRN

## 2018-06-14 MED ORDER — PHENOL 1.4 % MT LIQD
1.0000 | OROMUCOSAL | Status: DC | PRN
  Filled 2018-06-14: qty 177

## 2018-06-14 MED ORDER — HYDRALAZINE HCL 20 MG/ML IJ SOLN: 5.0000 mg | INTRAMUSCULAR | Status: DC | PRN

## 2018-06-14 MED ORDER — METOPROLOL TARTRATE 5 MG/5ML IV SOLN: 2.0000 mg | INTRAVENOUS | Status: DC | PRN

## 2018-06-14 MED ORDER — PROPOFOL 10 MG/ML IV BOLUS
INTRAVENOUS | Status: DC | PRN
  Administered 2018-06-14: 80 mg via INTRAVENOUS

## 2018-06-14 MED ORDER — NITROGLYCERIN IN D5W 200-5 MCG/ML-% IV SOLN: 5.0000 ug/min | INTRAVENOUS | Status: DC

## 2018-06-14 MED ORDER — DOCUSATE SODIUM 100 MG PO CAPS
100.0000 mg | ORAL_CAPSULE | Freq: Every day | ORAL | Status: DC
  Administered 2018-06-15: 100 mg via ORAL
  Filled 2018-06-14: qty 1

## 2018-06-14 MED ORDER — FENTANYL CITRATE (PF) 100 MCG/2ML IJ SOLN
25.0000 ug | INTRAMUSCULAR | Status: DC | PRN
  Administered 2018-06-14: 25 ug via INTRAVENOUS

## 2018-06-14 MED ORDER — SODIUM CHLORIDE 0.9 % IV SOLN
INTRAVENOUS | Status: DC
  Administered 2018-06-14: 18:00:00 via INTRAVENOUS

## 2018-06-14 MED ORDER — ALUM & MAG HYDROXIDE-SIMETH 200-200-20 MG/5ML PO SUSP
15.0000 mL | ORAL | Status: DC | PRN
  Filled 2018-06-14: qty 30

## 2018-06-14 MED ORDER — LIDOCAINE HCL (PF) 1 % IJ SOLN
INTRAMUSCULAR | Status: AC
  Filled 2018-06-14: qty 30

## 2018-06-14 MED ORDER — SODIUM CHLORIDE 0.9 % IV SOLN
500.0000 mL | Freq: Once | INTRAVENOUS | Status: AC | PRN
  Administered 2018-06-14: 500 mL via INTRAVENOUS

## 2018-06-14 MED ORDER — POTASSIUM CHLORIDE CRYS ER 20 MEQ PO TBCR
10.0000 meq | EXTENDED_RELEASE_TABLET | Freq: Two times a day (BID) | ORAL | Status: DC
  Administered 2018-06-14: 10 meq via ORAL
  Filled 2018-06-14 (×2): qty 1

## 2018-06-14 MED ORDER — GLYCOPYRROLATE 0.2 MG/ML IJ SOLN
INTRAMUSCULAR | Status: DC | PRN
  Administered 2018-06-14: 0.2 mg via INTRAVENOUS

## 2018-06-14 MED ORDER — SODIUM CHLORIDE 0.9 % IV SOLN
INTRAVENOUS | Status: DC | PRN
  Administered 2018-06-14: 50 ug/min via INTRAVENOUS

## 2018-06-14 MED ORDER — MIDAZOLAM HCL 2 MG/2ML IJ SOLN
INTRAMUSCULAR | Status: AC
  Filled 2018-06-14: qty 2

## 2018-06-14 MED ORDER — ROSUVASTATIN CALCIUM 10 MG PO TABS
40.0000 mg | ORAL_TABLET | Freq: Every day | ORAL | Status: DC
  Administered 2018-06-14: 40 mg via ORAL
  Filled 2018-06-14: qty 4

## 2018-06-14 MED ORDER — MIDAZOLAM HCL 2 MG/2ML IJ SOLN
INTRAMUSCULAR | Status: DC | PRN
  Administered 2018-06-14: 2 mg via INTRAVENOUS

## 2018-06-14 MED ORDER — CEFAZOLIN SODIUM-DEXTROSE 2-4 GM/100ML-% IV SOLN
2.0000 g | Freq: Three times a day (TID) | INTRAVENOUS | Status: AC
  Administered 2018-06-14 – 2018-06-15 (×2): 2 g via INTRAVENOUS
  Filled 2018-06-14 (×3): qty 100

## 2018-06-14 MED ORDER — DEXAMETHASONE SODIUM PHOSPHATE 10 MG/ML IJ SOLN
INTRAMUSCULAR | Status: DC | PRN
  Administered 2018-06-14: 10 mg via INTRAVENOUS

## 2018-06-14 MED ORDER — OXYCODONE-ACETAMINOPHEN 5-325 MG PO TABS
1.0000 | ORAL_TABLET | ORAL | Status: DC | PRN
  Administered 2018-06-14: 1 via ORAL
  Administered 2018-06-15: 2 via ORAL
  Filled 2018-06-14: qty 2
  Filled 2018-06-14: qty 1

## 2018-06-14 MED ORDER — ASPIRIN EC 81 MG PO TBEC
81.0000 mg | DELAYED_RELEASE_TABLET | Freq: Every day | ORAL | Status: DC
  Administered 2018-06-14: 81 mg via ORAL
  Filled 2018-06-14 (×2): qty 1

## 2018-06-14 MED ORDER — HEPARIN SODIUM (PORCINE) 10000 UNIT/ML IJ SOLN
INTRAMUSCULAR | Status: AC
  Filled 2018-06-14: qty 1

## 2018-06-14 MED ORDER — SUCCINYLCHOLINE CHLORIDE 20 MG/ML IJ SOLN
INTRAMUSCULAR | Status: DC | PRN
  Administered 2018-06-14: 100 mg via INTRAVENOUS

## 2018-06-14 MED ORDER — ACETAMINOPHEN 325 MG PO TABS: 325.0000 mg | ORAL_TABLET | ORAL | Status: DC | PRN

## 2018-06-14 MED ORDER — ESMOLOL HCL-SODIUM CHLORIDE 2000 MG/100ML IV SOLN
25.0000 ug/kg/min | INTRAVENOUS | Status: DC
  Filled 2018-06-14: qty 100

## 2018-06-14 MED ORDER — ACETAMINOPHEN 650 MG RE SUPP: 325.0000 mg | RECTAL | Status: DC | PRN

## 2018-06-14 MED ORDER — FENTANYL CITRATE (PF) 100 MCG/2ML IJ SOLN
INTRAMUSCULAR | Status: DC | PRN
  Administered 2018-06-14 (×2): 50 ug via INTRAVENOUS

## 2018-06-14 MED ORDER — FAMOTIDINE 20 MG PO TABS
20.0000 mg | ORAL_TABLET | Freq: Once | ORAL | Status: AC
  Administered 2018-06-14: 20 mg via ORAL

## 2018-06-14 MED ORDER — SODIUM CHLORIDE 0.9 % IV SOLN
0.0000 ug/min | INTRAVENOUS | Status: DC
  Administered 2018-06-14: 30 ug/min via INTRAVENOUS
  Filled 2018-06-14: qty 1
  Filled 2018-06-14: qty 10
  Filled 2018-06-14: qty 1

## 2018-06-14 MED ORDER — FENTANYL CITRATE (PF) 100 MCG/2ML IJ SOLN
INTRAMUSCULAR | Status: AC
  Filled 2018-06-14: qty 2

## 2018-06-14 MED ORDER — SODIUM CHLORIDE 0.9 % IV SOLN
INTRAVENOUS | Status: DC | PRN
  Administered 2018-06-14: 200 mL via INTRAMUSCULAR

## 2018-06-14 MED ORDER — POTASSIUM CHLORIDE CRYS ER 20 MEQ PO TBCR: 20.0000 meq | EXTENDED_RELEASE_TABLET | Freq: Every day | ORAL | Status: DC | PRN

## 2018-06-14 MED ORDER — PROPOFOL 10 MG/ML IV BOLUS
INTRAVENOUS | Status: AC
  Filled 2018-06-14: qty 20

## 2018-06-14 MED ORDER — CLOPIDOGREL BISULFATE 75 MG PO TABS
75.0000 mg | ORAL_TABLET | Freq: Every day | ORAL | Status: DC
  Administered 2018-06-15: 75 mg via ORAL
  Filled 2018-06-14: qty 1

## 2018-06-14 MED ORDER — SODIUM CHLORIDE 0.9 % IV SOLN
500.0000 mL | Freq: Once | INTRAVENOUS | Status: AC
  Administered 2018-06-14: 500 mL via INTRAVENOUS

## 2018-06-14 MED ORDER — GUAIFENESIN-DM 100-10 MG/5ML PO SYRP
15.0000 mL | ORAL_SOLUTION | ORAL | Status: DC | PRN
  Filled 2018-06-14: qty 15

## 2018-06-14 MED ORDER — FAMOTIDINE IN NACL 20-0.9 MG/50ML-% IV SOLN
20.0000 mg | Freq: Two times a day (BID) | INTRAVENOUS | Status: DC
  Administered 2018-06-14: 20 mg via INTRAVENOUS
  Filled 2018-06-14: qty 50

## 2018-06-14 MED ORDER — MORPHINE SULFATE (PF) 2 MG/ML IV SOLN: 2.0000 mg | INTRAVENOUS | Status: DC | PRN

## 2018-06-14 MED ORDER — HEPARIN SODIUM (PORCINE) 1000 UNIT/ML IJ SOLN
INTRAMUSCULAR | Status: DC | PRN
  Administered 2018-06-14: 5000 [IU] via INTRAVENOUS

## 2018-06-14 MED ORDER — CEFAZOLIN SODIUM-DEXTROSE 2-4 GM/100ML-% IV SOLN
INTRAVENOUS | Status: AC
  Filled 2018-06-14: qty 100

## 2018-06-14 MED ORDER — GABAPENTIN 300 MG PO CAPS
300.0000 mg | ORAL_CAPSULE | Freq: Three times a day (TID) | ORAL | Status: DC
  Administered 2018-06-14 (×2): 300 mg via ORAL
  Filled 2018-06-14 (×3): qty 1

## 2018-06-14 MED ORDER — ONDANSETRON HCL 4 MG/2ML IJ SOLN: 4.0000 mg | Freq: Four times a day (QID) | INTRAMUSCULAR | Status: DC | PRN

## 2018-06-14 MED ORDER — EVICEL 2 ML EX KIT
PACK | CUTANEOUS | Status: AC
  Filled 2018-06-14: qty 1

## 2018-06-14 MED ORDER — EPHEDRINE SULFATE 50 MG/ML IJ SOLN
INTRAMUSCULAR | Status: DC | PRN
  Administered 2018-06-14: 10 mg via INTRAVENOUS

## 2018-06-14 MED ORDER — EVICEL 2 ML EX KIT
PACK | CUTANEOUS | Status: DC | PRN
  Administered 2018-06-14: 2 mL via TOPICAL

## 2018-06-14 SURGICAL SUPPLY — 61 items
BAG DECANTER FOR FLEXI CONT (MISCELLANEOUS) ×3 IMPLANT
BLADE SURG 15 STRL LF DISP TIS (BLADE) ×1 IMPLANT
BLADE SURG 15 STRL SS (BLADE) ×2
BLADE SURG SZ11 CARB STEEL (BLADE) ×3 IMPLANT
BOOT SUTURE AID YELLOW STND (SUTURE) ×3 IMPLANT
BRUSH SCRUB EZ  4% CHG (MISCELLANEOUS) ×2
BRUSH SCRUB EZ 4% CHG (MISCELLANEOUS) ×1 IMPLANT
CANISTER SUCT 1200ML W/VALVE (MISCELLANEOUS) ×3 IMPLANT
DERMABOND ADVANCED (GAUZE/BANDAGES/DRESSINGS) ×2
DERMABOND ADVANCED .7 DNX12 (GAUZE/BANDAGES/DRESSINGS) ×1 IMPLANT
DRAPE INCISE IOBAN 66X45 STRL (DRAPES) ×3 IMPLANT
DRAPE LAPAROTOMY 77X122 PED (DRAPES) ×3 IMPLANT
DRAPE SHEET LG 3/4 BI-LAMINATE (DRAPES) ×3 IMPLANT
DRSG TEGADERM 4X4.75 (GAUZE/BANDAGES/DRESSINGS) IMPLANT
DRSG TELFA 3X8 NADH (GAUZE/BANDAGES/DRESSINGS) IMPLANT
DURAPREP 26ML APPLICATOR (WOUND CARE) ×3 IMPLANT
ELECT CAUTERY BLADE 6.4 (BLADE) ×3 IMPLANT
ELECT REM PT RETURN 9FT ADLT (ELECTROSURGICAL) ×3
ELECTRODE REM PT RTRN 9FT ADLT (ELECTROSURGICAL) ×1 IMPLANT
GLOVE BIO SURGEON STRL SZ7 (GLOVE) ×9 IMPLANT
GLOVE INDICATOR 7.5 STRL GRN (GLOVE) ×3 IMPLANT
GOWN STRL REUS W/ TWL LRG LVL3 (GOWN DISPOSABLE) ×1 IMPLANT
GOWN STRL REUS W/ TWL XL LVL3 (GOWN DISPOSABLE) ×1 IMPLANT
GOWN STRL REUS W/TWL LRG LVL3 (GOWN DISPOSABLE) ×2
GOWN STRL REUS W/TWL XL LVL3 (GOWN DISPOSABLE) ×2
HEMOSTAT SURGICEL 2X3 (HEMOSTASIS) ×3 IMPLANT
IV NS 250ML (IV SOLUTION) ×2
IV NS 250ML BAXH (IV SOLUTION) ×1 IMPLANT
KIT TURNOVER KIT A (KITS) ×3 IMPLANT
LABEL OR SOLS (LABEL) ×3 IMPLANT
LOOP RED MAXI  1X406MM (MISCELLANEOUS) ×4
LOOP VESSEL MAXI 1X406 RED (MISCELLANEOUS) ×2 IMPLANT
LOOP VESSEL MINI 0.8X406 BLUE (MISCELLANEOUS) ×1 IMPLANT
LOOPS BLUE MINI 0.8X406MM (MISCELLANEOUS) ×2
NEEDLE FILTER BLUNT 18X 1/2SAF (NEEDLE) ×2
NEEDLE FILTER BLUNT 18X1 1/2 (NEEDLE) ×1 IMPLANT
NEEDLE HYPO 25X1 1.5 SAFETY (NEEDLE) ×3 IMPLANT
NS IRRIG 1000ML POUR BTL (IV SOLUTION) ×3 IMPLANT
PACK BASIN MAJOR ARMC (MISCELLANEOUS) ×3 IMPLANT
PATCH CAROTID ECM VASC 1X10 (Prosthesis & Implant Heart) ×3 IMPLANT
PENCIL ELECTRO HAND CTR (MISCELLANEOUS) ×3 IMPLANT
SHUNT W TPORT 9FR PRUITT F3 (SHUNT) ×3 IMPLANT
SUT MNCRL 4-0 (SUTURE) ×2
SUT MNCRL 4-0 27XMFL (SUTURE) ×1
SUT PROLENE 6 0 BV (SUTURE) ×12 IMPLANT
SUT PROLENE 7 0 BV 1 (SUTURE) ×6 IMPLANT
SUT SILK 2 0 (SUTURE) ×2
SUT SILK 2-0 18XBRD TIE 12 (SUTURE) ×1 IMPLANT
SUT SILK 3 0 (SUTURE) ×2
SUT SILK 3-0 18XBRD TIE 12 (SUTURE) ×1 IMPLANT
SUT SILK 4 0 (SUTURE) ×2
SUT SILK 4-0 18XBRD TIE 12 (SUTURE) ×1 IMPLANT
SUT VIC AB 3-0 SH 27 (SUTURE) ×4
SUT VIC AB 3-0 SH 27X BRD (SUTURE) ×2 IMPLANT
SUTURE MNCRL 4-0 27XMF (SUTURE) ×1 IMPLANT
SYR 10ML LL (SYRINGE) ×6 IMPLANT
SYR 20CC LL (SYRINGE) ×3 IMPLANT
TOWEL OR 17X26 4PK STRL BLUE (TOWEL DISPOSABLE) IMPLANT
TRAY FOLEY MTR SLVR 16FR STAT (SET/KITS/TRAYS/PACK) ×3 IMPLANT
TUBING CONNECTING 10 (TUBING) IMPLANT
TUBING CONNECTING 10' (TUBING)

## 2018-06-14 NOTE — H&P (Signed)
[facility] History & Physical Update  The patient was interviewed and re-examined.  The patient's previous History and Physical has been reviewed and is unchanged.  There is no change in the plan of care. We plan to proceed with the scheduled procedure.  Leotis Pain, MD  06/14/2018, 10:54 AM

## 2018-06-14 NOTE — Anesthesia Procedure Notes (Signed)
Procedure Name: Intubation Date/Time: 06/14/2018 12:30 PM Performed by: Nelda Marseille, CRNA Pre-anesthesia Checklist: Patient identified, Patient being monitored, Timeout performed, Emergency Drugs available and Suction available Patient Re-evaluated:Patient Re-evaluated prior to induction Oxygen Delivery Method: Circle system utilized Preoxygenation: Pre-oxygenation with 100% oxygen Induction Type: IV induction Ventilation: Mask ventilation without difficulty Laryngoscope Size: Mac, 3 and Glidescope Grade View: Grade II Tube type: Oral Tube size: 7.0 mm Number of attempts: 1 Airway Equipment and Method: Stylet and Video-laryngoscopy Placement Confirmation: ETT inserted through vocal cords under direct vision,  positive ETCO2 and breath sounds checked- equal and bilateral Secured at: 21 cm Tube secured with: Tape Dental Injury: Teeth and Oropharynx as per pre-operative assessment

## 2018-06-14 NOTE — Op Note (Signed)
Jasmine Acosta   OPERATIVE NOTE  PROCEDURE:   1.  Right carotid endarterectomy with CorMatrix arterial patch reconstruction  PRE-OPERATIVE DIAGNOSIS: 1.  Right carotid stenosis   POST-OPERATIVE DIAGNOSIS: same as above   SURGEON: Leotis Pain, MD  ASSISTANT(S): none  ANESTHESIA: general  ESTIMATED BLOOD LOSS: 50 cc  FINDING(S): 1.  right carotid plaque.  SPECIMEN(S):  Carotid plaque (sent to Pathology)  INDICATIONS:   Jasmine Acosta is a 67 y.o. female who presents with right carotid stenosis of 80-85%.  I discussed with the patient the risks, benefits, and alternatives to carotid endarterectomy.  I discussed the differences between carotid stenting and carotid endarterectomy. I discussed the procedural details of carotid endarterectomy with the patient.  The patient is aware that the risks of carotid endarterectomy include but are not limited to: bleeding, infection, stroke, myocardial infarction, death, cranial nerve injuries both temporary and permanent, neck hematoma, possible airway compromise, labile blood pressure post-operatively, cerebral hyperperfusion syndrome, and possible need for additional interventions in the future. The patient is aware of the risks and agrees to proceed forward with the procedure.  DESCRIPTION: After full informed written consent was obtained from the patient, the patient was brought back to the operating room and placed supine upon the operating table.  Prior to induction, the patient received IV antibiotics.  After obtaining adequate anesthesia, the patient was placed into a modified beach chair position with a shoulder roll in place and the patient's neck slightly hyperextended and rotated away from the surgical site.  The patient was prepped in the standard fashion for a carotid endarterectomy.  I made an incision anterior to the sternocleidomastoid muscle and dissected down through the subcutaneous tissue.  The platysmas was opened  with electrocautery.  Then I dissected down to the internal jugular vein and facial vein.  The facial vein is ligated and divided between 2-0 silk ties.  This was dissected posteriorly until I obtained visualization of the common carotid artery.  This was dissected out and then a vessel loop was placed around the common carotid artery.  I then dissected in a periadventitial fashion along the common carotid artery up to the bifurcation.  I then identified the external carotid artery and the superior thyroid artery.  I placed a vessel loop around the superior thyroid artery, and I also dissected out the external carotid artery and placed a vessel loop around it. In the process of this dissection, the hypoglossal nerve was identified and protected from harm.  I then dissected out the internal carotid artery until I identified an area in the internal carotid artery clearly above the stenosis.  I dissected slightly distal to this area, and placed a vessel loop around the artery.  At this point, we gave the patient 5000 units of intravenous heparin.  After this was allowed to circulate for several minutes, I pulled up control on the vessel loops to clamp the internal carotid artery, external carotid artery, superior thyroid artery, and then the common carotid artery.  I then made an arteriotomy in the common carotid artery with a 11 blade, and extended the arteriotomy with a Potts scissor down into the common carotid artery, then I carried the arteriotomy through the bifurcation into the internal carotid artery until I reached an area that was not diseased.  At this point, I took the P[place] shunt that previously been prepared and I inserted it into the internal carotid artery first, and then into the common carotid artery taking care  to flush and de-air prior to release of control. At this point, I started the endarterectomy in the common carotid artery with a [place]field elevator and carried this dissection down  into the common carotid artery circumferentially.  Then I transected the plaque at a segment where it was adherent and transected the plaque with Potts scissors.  I then carried this dissection up into the external carotid artery.  The plaque was extracted by unclamping the external carotid artery and performing an eversion endarterectomy.  The dissection was then carried into the internal carotid artery where a nice feathered end point was created with gentle traction.  I passed the plaque off the field as a specimen. At this point I removed all loose flecks and remaining disease possible.  At this point, I was satisfied that the minimal remaining disease was densely adherent to the wall and wall integrity was intact. The distal endpoint was tacked down with two 7-0 Prolene sutures.  I then fashioned a CorMatrix arterial patch for the artery and sewed it in place with two running stitch of 6-0 Prolene.  I started at the distal endpoint and ran one half the length of the arteriotomy.  I then cut and beveled the patch to an appropriate length to match the arteriotomy.  I started the second 6-0 Prolene at the proximal end point.  The medial suture line was completed and the lateral suture line was run approximately one quarter the length of the arteriotomy.  Prior to completing this patch angioplasty, I removed the shunt first from the internal carotid artery, from which there was excellent backbleeding, and clamped it.  Then I removed the shunt from the common carotid artery, from which there was excellent antegrade bleeding, and then clamped it.  At this point, I allowed the external carotid artery to backbleed, which was excellent.  Then I instilled heparinized saline in this patched artery and then completed the patch angioplasty in the usual fashion.  First, I released the clamp on the external carotid artery, then I released it on the common carotid artery.  After waiting a few seconds, I then released it on the  internal carotid artery. Several minutes of pressure were held and 6-0 Prolene patch sutures were used as need for hemostasis.  At this point, I placed Surgicel and Evicel topical hemostatic agents.  There was no more active bleeding in the surgical site.  The sternocleidomastoid space was closed with three interrupted 3-0 Vicryl sutures. I then reapproximated the platysma muscle with a running stitch of 3-0 Vicryl.  The skin was then closed with a running subcuticular 4-0 Monocryl.  The skin was then cleaned, dried and Dermabond was used to reinforce the skin closure.  The patient awakened and was taken to the recovery room in stable condition, following commands and moving all four extremities without any apparent deficits.    COMPLICATIONS: none  CONDITION: stable  Leotis Pain  06/14/2018, 1:49 PM    This note was created with Dragon Medical transcription system. Any errors in dictation are purely unintentional.

## 2018-06-14 NOTE — Anesthesia Postprocedure Evaluation (Addendum)
Anesthesia Post Note  Patient: Jasmine Acosta  Procedure(s) Performed: ENDARTERECTOMY CAROTID (Right Neck)  Patient location during evaluation: PACU Anesthesia Type: General Level of consciousness: awake and alert Pain management: pain level controlled Vital Signs Assessment: post-procedure vital signs reviewed and stable Respiratory status: spontaneous breathing, nonlabored ventilation, respiratory function stable and patient connected to nasal cannula oxygen Cardiovascular status: stable Postop Assessment: no apparent nausea or vomiting Comments: MAPs borderline low despite fluid bolus x 2, starting low-dose phenylephrine infusion and patient is being admitted to the ICU overnight.       Last Vitals:  Vitals:   06/14/18 1511 06/14/18 1515  BP: (!) 91/52 (!) 86/55  Pulse: 82 77  Resp: 19 18  Temp:    SpO2: 100% 96%    Last Pain:  Vitals:   06/14/18 1526  TempSrc:   PainSc: [name]

## 2018-06-14 NOTE — Transfer of Care (Signed)
Immediate Anesthesia Transfer of Care Note  Patient: Jasmine Acosta  Procedure(s) Performed: ENDARTERECTOMY CAROTID (Right Neck)  Patient Location: PACU  Anesthesia Type:General  Level of Consciousness: sedated  Airway & Oxygen Therapy: Patient Spontanous Breathing and Patient connected to face mask oxygen  Post-op Assessment: Report given to RN and Post -op Vital signs reviewed and stable  Post vital signs: Reviewed and stable  Last Vitals:  Vitals Value Taken Time  BP 114/63 06/14/2018  2:03 PM  Temp    Pulse 90 06/14/2018  2:04 PM  Resp 19 06/14/2018  2:04 PM  SpO2 100 % 06/14/2018  2:04 PM  Vitals shown include unvalidated device data.  Last Pain:  Vitals:   06/14/18 1013  TempSrc: Oral  PainSc: 0-No pain         Complications: No apparent anesthesia complications

## 2018-06-14 NOTE — Anesthesia Procedure Notes (Signed)
Arterial Line Insertion Start/End8/22/2019 12:10 PM, 06/14/2018 12:15 PM Performed by: Molli Barrows, MD, anesthesiologist  Patient location: Pre-op. Preanesthetic checklist: patient identified, IV checked, site marked, risks and benefits discussed, surgical consent, monitors and equipment checked, pre-op evaluation, timeout performed and anesthesia consent Lidocaine 1% used for infiltration Left, radial was placed Catheter size: 20 Fr Hand hygiene performed  and maximum sterile barriers used   Attempts: 1 Procedure performed without using ultrasound guided technique. Following insertion, dressing applied. Post procedure assessment: normal and unchanged

## 2018-06-14 NOTE — Anesthesia Preprocedure Evaluation (Signed)
Anesthesia Evaluation  Patient identified by MRN, date of birth, ID band Patient awake    Reviewed: Allergy & Precautions, H&P , NPO status , Patient's Chart, lab work & pertinent test results, reviewed documented beta blocker date and time   Airway Mallampati: II  TM Distance: >3 FB Neck ROM: full    Dental  (+) Teeth Intact   Pulmonary neg pulmonary ROS, COPD, Current Smoker,    Pulmonary exam normal        Cardiovascular hypertension, On Medications + Peripheral Vascular Disease  negative cardio ROS Normal cardiovascular exam Rhythm:regular Rate:Normal     Neuro/Psych  Headaches, negative neurological ROS  negative psych ROS   GI/Hepatic negative GI ROS, Neg liver ROS,   Endo/Other  negative endocrine ROSHyperthyroidism   Renal/GU negative Renal ROS  negative genitourinary   Musculoskeletal   Abdominal   Peds  Hematology negative hematology ROS (+)   Anesthesia Other Findings Past Medical History: No date: Carotid artery occlusion 07/06/2015: COPD (chronic obstructive pulmonary disease) (HCC) No date: Headache No date: Hypertension No date: Hyperthyroidism     Comment:  ablated with iodine No date: Peripheral vascular disease ([place])     Comment:  Bilateral Carotid Artery Disease Past Surgical History: No date: ABDOMINAL HYSTERECTOMY     Comment:  heavy bleeding No date: hallux vagus correction; Right 07/01/2016: HALLUX VALGUS AUSTIN; Left     Comment:  Procedure: HALLUX VALGUS AUSTIN left foot;  Surgeon:               Sharlotte Alamo, DPM;  Location: [facility] ORS;  Service: Podiatry;              Laterality: Left; BMI    Body Mass Index:  20.62 kg/m     Reproductive/Obstetrics negative OB ROS                             Anesthesia Physical Anesthesia Plan  ASA: IV  Anesthesia Plan: General ETT   Post-op Pain Management:    Induction:   PONV Risk Score and Plan:   Airway  Management Planned:   Additional Equipment:   Intra-op Plan:   Post-operative Plan:   Informed Consent: I have reviewed the patients History and Physical, chart, labs and discussed the procedure including the risks, benefits and alternatives for the proposed anesthesia with the patient or authorized representative who has indicated his/her understanding and acceptance.   Dental Advisory Given  Plan Discussed with: CRNA  Anesthesia Plan Comments:         Anesthesia Quick Evaluation

## 2018-06-14 NOTE — Progress Notes (Signed)
Pt sitting up in bed. Drinking liquids. Tolerating well with no c/o nausea. Pt denies pain or discomfort.

## 2018-06-14 NOTE — Anesthesia Post-op Follow-up Note (Signed)
Anesthesia QCDR form completed.        

## 2018-06-15 ENCOUNTER — Encounter: Payer: Self-pay | Admitting: Vascular Surgery

## 2018-06-15 LAB — BASIC METABOLIC PANEL
Anion gap: 6 (ref 5–15)
BUN: 12 mg/dL (ref 8–23)
CALCIUM: 8.6 mg/dL — AB (ref 8.9–10.3)
CO2: 24 mmol/L (ref 22–32)
CREATININE: 0.8 mg/dL (ref 0.44–1.00)
Chloride: 110 mmol/L (ref 98–111)
GFR calc Af Amer: >60 mL/min (ref >60)
GFR calc non Af Amer: >60 mL/min (ref >60)
Glucose, Bld: 108 mg/dL — ABNORMAL HIGH (ref 70–99)
Potassium: 3.9 mmol/L (ref 3.5–5.1)
SODIUM: 140 mmol/L (ref 135–145)

## 2018-06-15 LAB — CBC
HEMATOCRIT: 29.6 % — AB (ref 35.0–47.0)
HEMOGLOBIN: 10 g/dL — AB (ref 12.0–16.0)
MCH: 32.3 pg (ref 26.0–34.0)
MCHC: 33.7 g/dL (ref 32.0–36.0)
MCV: 96.1 fL (ref 80.0–100.0)
Platelets: 165 10*3/uL (ref 150–440)
RBC: 3.08 MIL/uL — ABNORMAL LOW (ref 3.80–5.20)
RDW: 12.8 % (ref 11.5–14.5)
WBC: 13 10*3/uL — ABNORMAL HIGH (ref 3.6–11.0)

## 2018-06-15 LAB — GLUCOSE, CAPILLARY: Glucose-Capillary: 108 mg/dL — ABNORMAL HIGH (ref 70–99)

## 2018-06-15 MED ORDER — OXYCODONE-ACETAMINOPHEN 5-325 MG PO TABS: 1.0000 | ORAL_TABLET | ORAL | 0 refills | Status: DC | PRN

## 2018-06-15 MED ORDER — CLOPIDOGREL BISULFATE 75 MG PO TABS: 75.0000 mg | ORAL_TABLET | Freq: Every day | ORAL | 5 refills | Status: DC

## 2018-06-15 NOTE — Discharge Summary (Signed)
[facility]    Discharge Summary    Patient ID:  Jasmine Acosta MRN: [id number] DOB/AGE: 1951/01/01 67 y.o.  Admit date: 06/14/2018 Discharge date: 06/15/2018 Date of Surgery: 06/14/2018 Surgeon: Surgeon(s): Lucky Cowboy Erskine Squibb, MD  Admission Diagnosis: CAROTID ARTERY STENOSIS  Discharge Diagnoses:  CAROTID ARTERY STENOSIS  Secondary Diagnoses: Past Medical History:  Diagnosis Date  . Carotid artery occlusion   . COPD (chronic obstructive pulmonary disease) ([place]) 07/06/2015  . Headache   . Hypertension   . Hyperthyroidism    ablated with iodine  . Peripheral vascular disease ([place])    Bilateral Carotid Artery Disease    Procedure(s): ENDARTERECTOMY CAROTID  Discharged Condition: good  HPI:  Patient with high grade right carotid artery stenosis, here for CEA  Hospital Course:  Jasmine Acosta is a 67 y.o. female is S/P Right Procedure(s): ENDARTERECTOMY CAROTID Extubated: POD # 0 Physical exam: neck with minimal swelling, neuro exam normal Post-op wounds clean, dry, intact or healing well Pt. Ambulating, voiding and taking PO diet without difficulty. Pt pain controlled with PO pain meds. Labs as below Complications:none  Consults:    Significant Diagnostic Studies: CBC Lab Results  Component Value Date   WBC 13.0 (H) 06/15/2018   HGB 10.0 (L) 06/15/2018   HCT 29.6 (L) 06/15/2018   MCV 96.1 06/15/2018   PLT 165 06/15/2018    BMET    Component Value Date/Time   NA 140 06/15/2018 0456   K 3.9 06/15/2018 0456   CL 110 06/15/2018 0456   CO2 24 06/15/2018 0456   GLUCOSE 108 (H) 06/15/2018 0456   BUN 12 06/15/2018 0456   CREATININE 0.80 06/15/2018 0456   CALCIUM 8.6 (L) 06/15/2018 0456   GFRNONAA >60 06/15/2018 0456   GFRAA >60 06/15/2018 0456   COAG Lab Results  Component Value Date   INR 1.03 06/07/2018     Disposition:  Discharge to :Home Discharge Instructions    Call MD for:  redness, tenderness, or signs of infection  (pain, swelling, bleeding, redness, odor or green/yellow discharge around incision site)   Complete by:  As directed    Call MD for:  severe or increased pain, loss or decreased feeling  in affected limb(s)   Complete by:  As directed    Call MD for:  temperature >100.5   Complete by:  As directed    Driving Restrictions   Complete by:  As directed    No driving for 5 days   Lifting restrictions   Complete by:  As directed    No lifting for 3 days   No dressing needed   Complete by:  As directed    Replace only if drainage present   Resume previous diet   Complete by:  As directed      Allergies as of 06/15/2018      Reactions   Lisinopril Other (See Comments)   Other reaction(s): Angioedema Of tongue only   Codeine Nausea And Vomiting      Medication List    TAKE these medications   amLODipine 5 MG tablet Commonly known as:  NORVASC   aspirin EC 81 MG tablet Take by mouth.   clopidogrel 75 MG tablet Commonly known as:  PLAVIX Take 1 tablet (75 mg total) by mouth daily with breakfast.   gabapentin 300 MG capsule Commonly known as:  NEURONTIN Take 300 mg by mouth 3 (three) times daily.   ONE-A-DAY WOMENS PO Take 1 tablet by mouth daily.   oxyCODONE-acetaminophen  5-325 MG tablet Commonly known as:  PERCOCET/ROXICET Take 1-2 tablets by mouth every 4 (four) hours as needed for moderate pain.   potassium chloride 10 MEQ tablet Commonly known as:  K-DUR,KLOR-CON Take 10 mEq by mouth 2 (two) times daily.   rosuvastatin 40 MG tablet Commonly known as:  CRESTOR Take by mouth.      Verbal and written Discharge instructions given to the patient. Wound care per Discharge AVS Follow-up Information    Kris Hartmann, NP Follow up in 3 week(s).   Specialty:  Nurse Practitioner Why:  for wound check Contact information: [place] [address] [phone]           Signed: Leotis Pain, MD  06/15/2018, 8:17 AM

## 2018-06-15 NOTE — Progress Notes (Signed)
Patient left ICU by wheelchair with family member and [place], [place] at side.  Patient alert with no distress noted when leaving ICU.  No neuro deficits and no changes to incision site from prior assessment.  Discharged home.

## 2018-06-15 NOTE — Progress Notes (Signed)
Patient discharged. Instructions reviewed with patient along with education on new medications. Prescriptions given to patient. All questions answered and patient able to verbalize instructions. Awaiting family for pickup.

## 2018-06-18 LAB — SURGICAL PATHOLOGY

## 2018-06-19 ENCOUNTER — Telehealth: Payer: Self-pay

## 2018-06-19 NOTE — Telephone Encounter (Signed)
EMMI Follow-up: Noted on the report that the patient hadn't read discharge paperwork and not taking medications.  I talked with Jasmine Acosta and asked if she had read her discharge paperwork and she said no.  I asked if she had received the white envelope with her After visit summary in it and she went to look for it.  We reviewed the information over the phone.  She is taking her Plavix to prevent blood clots and was taking the oxycodone as needed for pain.  Reviewed the follow-up appointments and she had made an appointment with Dr. Eulogio Ditch on Sept. 13th at 9:15 am so I suggested she call Dr. Bunnie Domino office to reschedule her appointment with him since it was at the same time. She is aware of the other appointments now and will keep her paperwork as a reminder. Said she was feeling fine and no other needs noted for today.  I let her know there would be a 2nd automated call with a different series of questions and to let us know if she had any concerns. She thanked me for calling.

## 2018-06-29 NOTE — Progress Notes (Deleted)
ANNUAL PREVENTATIVE CARE GYN  ENCOUNTER NOTE  Subjective:       Jasmine Acosta is a 67 y.o. G0P0 female here for a routine annual gynecologic exam.  Current complaints: 1.  None  Widowed female whose last year. No children. Remote history of abnormal Pap smears-years ago Normal bowel function. Normal bladder function. No stress incontinence symptoms. No urgency. Patient does take calcium with vitamin D daily. Patient does exercise daily-walking extensively   Gynecologic History No LMP recorded. Patient has had a hysterectomy. Contraception: status post hysterectomy Last Pap: unknown. Results were: normal Last mammogram: 11/2016 birad 1 Results were: normal Menarche age 58 TAH age 35-Dr. Randon Goldsmith Menopause age 51 No current significant vasomotor symptoms. No history of estrogen replacement therapy use.  Obstetric History OB History  Gravida Para Term Preterm AB Living  0            SAB TAB Ectopic Multiple Live Births               Past Medical History:  Diagnosis Date  . Carotid artery occlusion   . COPD (chronic obstructive pulmonary disease) ([place]) 07/06/2015  . Headache   . Hypertension   . Hyperthyroidism    ablated with iodine  . Peripheral vascular disease ([place])    Bilateral Carotid Artery Disease    Past Surgical History:  Procedure Laterality Date  . ABDOMINAL HYSTERECTOMY     heavy bleeding  . ENDARTERECTOMY Right 06/14/2018   Procedure: ENDARTERECTOMY CAROTID;  Surgeon: Algernon Huxley, MD;  Location: [facility] ORS;  Service: Vascular;  Laterality: Right;  . hallux vagus correction Right   . HALLUX VALGUS AUSTIN Left 07/01/2016   Procedure: HALLUX VALGUS AUSTIN left foot;  Surgeon: Sharlotte Alamo, DPM;  Location: [facility] ORS;  Service: Podiatry;  Laterality: Left;    Current Outpatient Medications on File Prior to Visit  Medication Sig Dispense Refill  . amLODipine (NORVASC) 5 MG tablet   11  . aspirin EC 81 MG tablet Take by mouth.    . clopidogrel (PLAVIX) 75 MG  tablet Take 1 tablet (75 mg total) by mouth daily with breakfast. 30 tablet 5  . gabapentin (NEURONTIN) 300 MG capsule Take 300 mg by mouth 3 (three) times daily.     . Multiple Vitamins-Calcium (ONE-A-DAY WOMENS PO) Take 1 tablet by mouth daily.    Marland Kitchen oxyCODONE-acetaminophen (PERCOCET/ROXICET) 5-325 MG tablet Take 1-2 tablets by mouth every 4 (four) hours as needed for moderate pain. 30 tablet 0  . potassium chloride (K-DUR,KLOR-CON) 10 MEQ tablet Take 10 mEq by mouth 2 (two) times daily.    . rosuvastatin (CRESTOR) 40 MG tablet Take by mouth.     No current facility-administered medications on file prior to visit.     Allergies  Allergen Reactions  . Lisinopril Other (See Comments)    Other reaction(s): Angioedema Of tongue only  . Codeine Nausea And Vomiting    Social History   Socioeconomic History  . Marital status: Single    Spouse name: Not on file  . Number of children: Not on file  . Years of education: Not on file  . Highest education level: Not on file  Occupational History  . Not on file  Social Needs  . Financial resource strain: Not on file  . Food insecurity:    Worry: Not on file    Inability: Not on file  . Transportation needs:    Medical: Not on file    Non-medical: Not on file  Tobacco Use  .  Smoking status: Current Every Day Smoker    Packs/day: 0.25    Types: Cigarettes  . Smokeless tobacco: Never Used  Substance and Sexual Activity  . Alcohol use: No  . Drug use: No  . Sexual activity: Not Currently    Birth control/protection: Surgical  Lifestyle  . Physical activity:    Days per week: Not on file    Minutes per session: Not on file  . Stress: Not on file  Relationships  . Social connections:    Talks on phone: Not on file    Gets together: Not on file    Attends religious service: Not on file    Active member of club or organization: Not on file    Attends meetings of clubs or organizations: Not on file    Relationship status: Not on  file  . Intimate partner violence:    Fear of current or ex partner: Not on file    Emotionally abused: Not on file    Physically abused: Not on file    Forced sexual activity: Not on file  Other Topics Concern  . Not on file  Social History Narrative  . Not on file    Family History  Problem Relation Age of Onset  . Diabetes Mother   . Emphysema Father   . Heart disease Sister   . Heart disease Brother   . Breast cancer Neg Hx   . Ovarian cancer Neg Hx   . Colon cancer Neg Hx     The following portions of the patient's history were reviewed and updated as appropriate: allergies, current medications, past family history, past medical history, past social history, past surgical history and problem list.  Review of Systems   Objective:   There were no vitals taken for this visit. CONSTITUTIONAL: Well-developed, well-nourished female in no acute distress.  PSYCHIATRIC: Normal mood and affect. Normal behavior. Normal judgment and thought content. [place]: Alert and oriented to person, place, and time. Normal muscle tone coordination. No cranial nerve deficit noted. HENT:  Normocephalic, atraumatic, External right and left ear normal. Oropharynx is clear and moist EYES: Conjunctivae and EOM are normal.  No scleral icterus.  NECK: Normal range of motion, supple, no masses.  Normal thyroid.  SKIN: Skin is warm and dry. No rash noted. Not diaphoretic. No erythema. No pallor. CARDIOVASCULAR: Normal heart rate noted, regular rhythm, no murmur. RESPIRATORY: Clear to auscultation bilaterally. Effort and breath sounds normal, no problems with respiration noted. BREASTS: Symmetric in size. No masses, skin changes, nipple drainage, or lymphadenopathy. ABDOMEN: Soft, normal bowel sounds, no distention noted.  No tenderness, rebound or guarding.  BLADDER: Normal PELVIC:  External Genitalia: Normal  BUS: Normal  Vagina: Atrophic changes; good vault support  Cervix: Surgically  absent  Uterus: Surgically absent  Adnexa: Normal; nonpalpable and nontender  RV: External Exam NormaI, No Rectal Masses and Normal Sphincter tone  MUSCULOSKELETAL: Normal range of motion. No tenderness.  No cyanosis, clubbing, or edema.  2+ distal pulses. LYMPHATIC: No Axillary, Supraclavicular, or Inguinal Adenopathy.    Assessment:   Annual gynecologic examination 67 y.o. Contraception: status post hysterectomy Normal BMI Menopause Vaginal atrophy, asymptomatic Status post TAH Remote history of cervical dysplasia with no recent abnormal Pap smears Tobacco user  Plan:  Pap: Not needed Mammogram: Ordered Stool Guaiac Testing:  Ordered Labs: thru pcp Routine preventative health maintenance measures emphasized: Exercise/Diet/Weight control, Tobacco Warnings and Alcohol/Substance use risks  Smoking cessation encouraged No medical management necessary for vaginal atrophy at  this time Return to [place], [place]'  Note: This dictation was prepared with Dragon dictation along with smaller phrase technology. Any transcriptional errors that result from this process are unintentional.

## 2018-07-02 DIAGNOSIS — H538 Other visual disturbances: Secondary | ICD-10-CM | POA: Diagnosis not present

## 2018-07-03 ENCOUNTER — Encounter: Payer: Medicare Other | Admitting: Obstetrics and Gynecology

## 2018-07-06 ENCOUNTER — Ambulatory Visit (INDEPENDENT_AMBULATORY_CARE_PROVIDER_SITE_OTHER): Payer: PPO | Admitting: Nurse Practitioner

## 2018-07-06 ENCOUNTER — Ambulatory Visit (INDEPENDENT_AMBULATORY_CARE_PROVIDER_SITE_OTHER): Payer: Self-pay | Admitting: Vascular Surgery

## 2018-07-06 ENCOUNTER — Encounter (INDEPENDENT_AMBULATORY_CARE_PROVIDER_SITE_OTHER): Payer: Self-pay | Admitting: Nurse Practitioner

## 2018-07-06 VITALS — BP 152/80 | HR 84 | Resp 16 | Ht 61.0 in | Wt 110.0 lb

## 2018-07-06 DIAGNOSIS — E785 Hyperlipidemia, unspecified: Secondary | ICD-10-CM

## 2018-07-06 DIAGNOSIS — Z720 Tobacco use: Secondary | ICD-10-CM

## 2018-07-06 DIAGNOSIS — I6523 Occlusion and stenosis of bilateral carotid arteries: Secondary | ICD-10-CM

## 2018-07-06 NOTE — Progress Notes (Addendum)
Subjective:    Patient ID: Jasmine Acosta, female    DOB: [birth year], 67 y.o.   MRN: [id number] Chief Complaint  Patient presents with  . Follow-up    [facility] 3week CEA    HPI  Jasmine Acosta is a 67 y.o. female who presents for follow-up after right carotid endarterectomy on 06/14/2018.  The incision is well approximated with some remaining Dermabond in place.  The patient states that it is still somewhat sore, there is minimal swelling present.  There is no drainage present.  The patient denies any issues with swallowing or eating.  She denies any issues with breathing.  She denies any issues with her voice.  She denies any fever, chills, nausea, vomiting, diarrhea.  She denies any chest pain or shortness of breath.   Constitutional: [] Weight loss  [] Fever  [] Chills Cardiac: [] Chest pain   [] Chest pressure   [] Palpitations   [] Shortness of breath when laying flat   [] Shortness of breath with exertion. Vascular:  [] Pain in legs with walking   [] Pain in legs with standing  [] History of DVT   [] Phlebitis   [] Swelling in legs   [] Varicose veins   [] Non-healing ulcers Pulmonary:   [] Uses home oxygen   [] Productive cough   [] Hemoptysis   [] Wheeze  [] COPD   [] Asthma Neurologic:  [] Dizziness   [] Seizures   [] History of stroke   [] History of TIA  [] Aphasia   [] Vissual changes   [] Weakness or numbness in arm   [] Weakness or numbness in leg Musculoskeletal:   [] Joint swelling   [] Joint pain   [] Low back pain Hematologic:  [] Easy bruising  [] Easy bleeding   [] Hypercoagulable state   [] Anemic Gastrointestinal:  [] Diarrhea   [] Vomiting  [] Gastroesophageal reflux/heartburn   [] Difficulty swallowing. Genitourinary:  [] Chronic kidney disease   [] Difficult urination  [] Frequent urination   [] Blood in urine Skin:  [] Rashes   [] Ulcers  Psychological:  [] History of anxiety   []  History of major depression.     Objective:   Physical Exam  BP (!) 152/80 (BP Location: Right Arm)   Pulse 84   Resp 16   Ht 5'  1" (1.549 m)   Wt 110 lb (49.9 kg)   BMI 20.78 kg/m   Past Medical History:  Diagnosis Date  . Carotid artery occlusion   . COPD (chronic obstructive pulmonary disease) ([place]) 07/06/2015  . Headache   . Hypertension   . Hyperthyroidism    ablated with iodine  . Peripheral vascular disease ([place])    Bilateral Carotid Artery Disease     Gen: WD/WN, NAD Head: [place]/AT, No temporalis wasting.  Ear/Nose/Throat: Hearing grossly intact, nares w/o erythema or drainage Eyes: PER, EOMI, sclera nonicteric.  Neck: Supple, no masses.  No JVD.  Pulmonary:  Good air movement, no use of accessory muscles.  Cardiac: RRR Vascular:  No bruits auscultated Vessel Right Left  Radial  2+  2+  Gastrointestinal: soft, non-distended. No guarding/no peritoneal signs.  Musculoskeletal: M/S 5/5 throughout.  No deformity or atrophy.  Neurologic: Pain and light touch intact in extremities.  Symmetrical.  Speech is fluent. Motor exam as listed above. Psychiatric: Judgment intact, Mood & affect appropriate for pt's clinical situation. Dermatologic: No Venous rashes. No Ulcers Noted.  No changes consistent with cellulitis. Lymph : No Cervical lymphadenopathy, no lichenification or skin changes of chronic lymphedema.   Social History   Socioeconomic History  . Marital status: Single    Spouse name: Not on file  . Number of children: Not  on file  . Years of education: Not on file  . Highest education level: Not on file  Occupational History  . Not on file  Social Needs  . Financial resource strain: Not on file  . Food insecurity:    Worry: Not on file    Inability: Not on file  . Transportation needs:    Medical: Not on file    Non-medical: Not on file  Tobacco Use  . Smoking status: Current Every Day Smoker    Packs/day: 0.25    Types: Cigarettes  . Smokeless tobacco: Never Used  Substance and Sexual Activity  . Alcohol use: No  . Drug use: No  . Sexual activity: Not Currently    Birth  control/protection: Surgical  Lifestyle  . Physical activity:    Days per week: Not on file    Minutes per session: Not on file  . Stress: Not on file  Relationships  . Social connections:    Talks on phone: Not on file    Gets together: Not on file    Attends religious service: Not on file    Active member of club or organization: Not on file    Attends meetings of clubs or organizations: Not on file    Relationship status: Not on file  . Intimate partner violence:    Fear of current or ex partner: Not on file    Emotionally abused: Not on file    Physically abused: Not on file    Forced sexual activity: Not on file  Other Topics Concern  . Not on file  Social History Narrative  . Not on file    Past Surgical History:  Procedure Laterality Date  . ABDOMINAL HYSTERECTOMY     heavy bleeding  . ENDARTERECTOMY Right 06/14/2018   Procedure: ENDARTERECTOMY CAROTID;  Surgeon: Algernon Huxley, MD;  Location: [facility] ORS;  Service: Vascular;  Laterality: Right;  . hallux vagus correction Right   . HALLUX VALGUS AUSTIN Left 07/01/2016   Procedure: HALLUX VALGUS AUSTIN left foot;  Surgeon: Sharlotte Alamo, DPM;  Location: [facility] ORS;  Service: Podiatry;  Laterality: Left;    Family History  Problem Relation Age of Onset  . Diabetes Mother   . Emphysema Father   . Heart disease Sister   . Heart disease Brother   . Breast cancer Neg Hx   . Ovarian cancer Neg Hx   . Colon cancer Neg Hx     Allergies  Allergen Reactions  . Lisinopril Other (See Comments)    Other reaction(s): Angioedema Of tongue only  . Codeine Nausea And Vomiting       Assessment & Plan:   1. Bilateral carotid artery stenosis Recommend:  The patient is s/p successful right CEA   Continue antiplatelet therapy as prescribed Continue management of CAD, HTN and Hyperlipidemia Healthy heart diet,  encouraged exercise at least 4 times per week  Follow up in 3 months with duplex ultrasound and physical exam based on  the patient's carotid surgery - VAS US CAROTID; Future  2. Hyperlipidemia, unspecified hyperlipidemia type Continue statin as ordered and reviewed, no changes at this time   3. Tobacco user Patient counseled again on smoking cessation.  Patient stated that she will continue to try to stop smoking.   Current Outpatient Medications on File Prior to Visit  Medication Sig Dispense Refill  . amLODipine (NORVASC) 5 MG tablet   11  . aspirin EC 81 MG tablet Take by mouth.    Marland Kitchen  clopidogrel (PLAVIX) 75 MG tablet Take 1 tablet (75 mg total) by mouth daily with breakfast. 30 tablet 5  . gabapentin (NEURONTIN) 300 MG capsule Take 300 mg by mouth 3 (three) times daily.     . Multiple Vitamins-Calcium (ONE-A-DAY WOMENS PO) Take 1 tablet by mouth daily.    Marland Kitchen oxyCODONE-acetaminophen (PERCOCET/ROXICET) 5-325 MG tablet Take 1-2 tablets by mouth every 4 (four) hours as needed for moderate pain. 30 tablet 0  . potassium chloride (K-DUR,KLOR-CON) 10 MEQ tablet Take 10 mEq by mouth 2 (two) times daily.    . rosuvastatin (CRESTOR) 40 MG tablet Take by mouth.     No current facility-administered medications on file prior to visit.     There are no Patient Instructions on file for this visit. No follow-ups on file.   Kris Hartmann, NP

## 2018-07-10 ENCOUNTER — Ambulatory Visit (INDEPENDENT_AMBULATORY_CARE_PROVIDER_SITE_OTHER): Payer: Self-pay | Admitting: Vascular Surgery

## 2018-07-18 DIAGNOSIS — M5136 Other intervertebral disc degeneration, lumbar region: Secondary | ICD-10-CM | POA: Diagnosis not present

## 2018-07-18 DIAGNOSIS — M5440 Lumbago with sciatica, unspecified side: Secondary | ICD-10-CM | POA: Diagnosis not present

## 2018-07-18 DIAGNOSIS — Z23 Encounter for immunization: Secondary | ICD-10-CM | POA: Diagnosis not present

## 2018-08-13 DIAGNOSIS — I739 Peripheral vascular disease, unspecified: Secondary | ICD-10-CM | POA: Diagnosis not present

## 2018-08-13 DIAGNOSIS — Z23 Encounter for immunization: Secondary | ICD-10-CM | POA: Diagnosis not present

## 2018-08-13 DIAGNOSIS — J449 Chronic obstructive pulmonary disease, unspecified: Secondary | ICD-10-CM | POA: Diagnosis not present

## 2018-08-13 DIAGNOSIS — I10 Essential (primary) hypertension: Secondary | ICD-10-CM | POA: Diagnosis not present

## 2018-08-15 ENCOUNTER — Other Ambulatory Visit: Payer: Self-pay | Admitting: Internal Medicine

## 2018-08-15 ENCOUNTER — Encounter (INDEPENDENT_AMBULATORY_CARE_PROVIDER_SITE_OTHER): Payer: PPO

## 2018-08-15 ENCOUNTER — Ambulatory Visit (INDEPENDENT_AMBULATORY_CARE_PROVIDER_SITE_OTHER): Payer: PPO | Admitting: Vascular Surgery

## 2018-08-15 DIAGNOSIS — I739 Peripheral vascular disease, unspecified: Secondary | ICD-10-CM

## 2018-08-20 DIAGNOSIS — H43813 Vitreous degeneration, bilateral: Secondary | ICD-10-CM | POA: Diagnosis not present

## 2018-08-20 DIAGNOSIS — H2511 Age-related nuclear cataract, right eye: Secondary | ICD-10-CM | POA: Diagnosis not present

## 2018-08-30 ENCOUNTER — Ambulatory Visit
Admission: RE | Admit: 2018-08-30 | Discharge: 2018-08-30 | Disposition: A | Discharge status: Home / Self Care | Payer: PPO | Source: Ambulatory Visit | Attending: Internal Medicine | Admitting: Internal Medicine

## 2018-08-30 DIAGNOSIS — I700 Atherosclerosis of aorta: Secondary | ICD-10-CM | POA: Diagnosis not present

## 2018-08-30 DIAGNOSIS — I708 Atherosclerosis of other arteries: Secondary | ICD-10-CM | POA: Diagnosis not present

## 2018-08-30 DIAGNOSIS — I774 Celiac artery compression syndrome: Secondary | ICD-10-CM | POA: Diagnosis not present

## 2018-08-30 DIAGNOSIS — I701 Atherosclerosis of renal artery: Secondary | ICD-10-CM | POA: Insufficient documentation

## 2018-08-30 DIAGNOSIS — I739 Peripheral vascular disease, unspecified: Secondary | ICD-10-CM | POA: Insufficient documentation

## 2018-08-30 LAB — POCT I-STAT CREATININE: Creatinine, Ser: 0.8 mg/dL (ref 0.44–1.00)

## 2018-08-30 MED ORDER — IOPAMIDOL (ISOVUE-370) INJECTION 76%
100.0000 mL | Freq: Once | INTRAVENOUS | Status: AC | PRN
  Administered 2018-08-30: 100 mL via INTRAVENOUS

## 2018-09-19 DIAGNOSIS — J449 Chronic obstructive pulmonary disease, unspecified: Secondary | ICD-10-CM | POA: Diagnosis not present

## 2018-09-19 DIAGNOSIS — I10 Essential (primary) hypertension: Secondary | ICD-10-CM | POA: Diagnosis not present

## 2018-09-19 DIAGNOSIS — I739 Peripheral vascular disease, unspecified: Secondary | ICD-10-CM | POA: Diagnosis not present

## 2018-09-28 DIAGNOSIS — H2511 Age-related nuclear cataract, right eye: Secondary | ICD-10-CM | POA: Diagnosis not present

## 2018-10-02 ENCOUNTER — Other Ambulatory Visit: Payer: Self-pay

## 2018-10-02 ENCOUNTER — Encounter: Payer: Self-pay | Admitting: *Deleted

## 2018-10-03 NOTE — Anesthesia Preprocedure Evaluation (Addendum)
Anesthesia Evaluation  Patient identified by MRN, date of birth, ID band Patient awake    Reviewed: Allergy & Precautions, NPO status , Patient's Chart, lab work & pertinent test results  History of Anesthesia Complications Negative for: history of anesthetic complications  Airway Mallampati: I   Neck ROM: Full    Dental  (+)    Pulmonary COPD, former smoker (quit 05/2018),    Pulmonary exam normal breath sounds clear to auscultation       Cardiovascular hypertension, + Peripheral Vascular Disease (carotid stenosis s/p right CEA 05/2018)  Normal cardiovascular exam Rhythm:Regular Rate:Normal  On Plavix   Neuro/Psych  Headaches,    GI/Hepatic negative GI ROS,   Endo/Other  Hyperthyroidism (s/ ablation)   Renal/GU negative Renal ROS     Musculoskeletal   Abdominal   Peds  Hematology negative hematology ROS (+)   Anesthesia Other Findings   Reproductive/Obstetrics                            Anesthesia Physical Anesthesia Plan  ASA: III  Anesthesia Plan: MAC   Post-op Pain Management:    Induction: Intravenous  PONV Risk Score and Plan: 2 and TIVA and Midazolam  Airway Management Planned: Natural Airway  Additional Equipment:   Intra-op Plan:   Post-operative Plan:   Informed Consent: I have reviewed the patients History and Physical, chart, labs and discussed the procedure including the risks, benefits and alternatives for the proposed anesthesia with the patient or authorized representative who has indicated his/her understanding and acceptance.     Plan Discussed with: CRNA  Anesthesia Plan Comments:        Anesthesia Quick Evaluation

## 2018-10-04 NOTE — Discharge Instructions (Signed)

## 2018-10-09 ENCOUNTER — Ambulatory Visit: Payer: PPO | Admitting: Anesthesiology

## 2018-10-09 ENCOUNTER — Encounter
Admission: RE | Disposition: A | Discharge status: Home / Self Care | Payer: Self-pay | Source: Home / Self Care | Attending: Ophthalmology

## 2018-10-09 ENCOUNTER — Ambulatory Visit
Admission: RE | Admit: 2018-10-09 | Discharge: 2018-10-09 | Disposition: A | Discharge status: Home / Self Care | Payer: PPO | Attending: Ophthalmology | Admitting: Ophthalmology

## 2018-10-09 DIAGNOSIS — J449 Chronic obstructive pulmonary disease, unspecified: Secondary | ICD-10-CM | POA: Diagnosis not present

## 2018-10-09 DIAGNOSIS — Z87891 Personal history of nicotine dependence: Secondary | ICD-10-CM | POA: Insufficient documentation

## 2018-10-09 DIAGNOSIS — H25811 Combined forms of age-related cataract, right eye: Secondary | ICD-10-CM | POA: Diagnosis not present

## 2018-10-09 DIAGNOSIS — Z79899 Other long term (current) drug therapy: Secondary | ICD-10-CM | POA: Diagnosis not present

## 2018-10-09 DIAGNOSIS — H2511 Age-related nuclear cataract, right eye: Secondary | ICD-10-CM | POA: Diagnosis not present

## 2018-10-09 DIAGNOSIS — I10 Essential (primary) hypertension: Secondary | ICD-10-CM | POA: Insufficient documentation

## 2018-10-09 DIAGNOSIS — I739 Peripheral vascular disease, unspecified: Secondary | ICD-10-CM | POA: Insufficient documentation

## 2018-10-09 DIAGNOSIS — Z7902 Long term (current) use of antithrombotics/antiplatelets: Secondary | ICD-10-CM | POA: Insufficient documentation

## 2018-10-09 HISTORY — PX: CATARACT EXTRACTION W/PHACO: SHX586

## 2018-10-09 SURGERY — PHACOEMULSIFICATION, CATARACT, WITH IOL INSERTION
Anesthesia: Monitor Anesthesia Care | Site: Eye | Laterality: Right

## 2018-10-09 MED ORDER — MIDAZOLAM HCL 2 MG/2ML IJ SOLN
INTRAMUSCULAR | Status: DC | PRN
  Administered 2018-10-09: 2 mg via INTRAVENOUS

## 2018-10-09 MED ORDER — MOXIFLOXACIN HCL 0.5 % OP SOLN
1.0000 [drp] | OPHTHALMIC | Status: DC | PRN
  Administered 2018-10-09 (×3): 1 [drp] via OPHTHALMIC

## 2018-10-09 MED ORDER — BRIMONIDINE TARTRATE-TIMOLOL 0.2-0.5 % OP SOLN
OPHTHALMIC | Status: DC | PRN
  Administered 2018-10-09: 1 [drp] via OPHTHALMIC

## 2018-10-09 MED ORDER — EPINEPHRINE PF 1 MG/ML IJ SOLN
INTRAOCULAR | Status: DC | PRN
  Administered 2018-10-09: 64 mL via OPHTHALMIC

## 2018-10-09 MED ORDER — ARMC OPHTHALMIC DILATING DROPS
1.0000 "application " | OPHTHALMIC | Status: DC | PRN
  Administered 2018-10-09 (×3): 1 via OPHTHALMIC

## 2018-10-09 MED ORDER — TETRACAINE HCL 0.5 % OP SOLN
1.0000 [drp] | OPHTHALMIC | Status: DC | PRN
  Administered 2018-10-09 (×2): 1 [drp] via OPHTHALMIC

## 2018-10-09 MED ORDER — NA HYALUR & NA CHOND-NA HYALUR 0.4-0.35 ML IO KIT
PACK | INTRAOCULAR | Status: DC | PRN
  Administered 2018-10-09: 1 mL via INTRAOCULAR

## 2018-10-09 MED ORDER — LACTATED RINGERS IV SOLN: INTRAVENOUS | Status: DC

## 2018-10-09 MED ORDER — LIDOCAINE HCL (PF) 2 % IJ SOLN
INTRAOCULAR | Status: DC | PRN
  Administered 2018-10-09: .5 mL

## 2018-10-09 MED ORDER — CEFUROXIME OPHTHALMIC INJECTION 1 MG/0.1 ML
INJECTION | OPHTHALMIC | Status: DC | PRN
  Administered 2018-10-09: 0.1 mL via INTRACAMERAL

## 2018-10-09 SURGICAL SUPPLY — 26 items
CANNULA ANT/CHMB 27G (MISCELLANEOUS) ×1 IMPLANT
CANNULA ANT/CHMB 27GA (MISCELLANEOUS) ×3 IMPLANT
GLOVE SURG LX 7.5 STRW (GLOVE) ×2
GLOVE SURG LX STRL 7.5 STRW (GLOVE) ×1 IMPLANT
GLOVE SURG TRIUMPH 8.0 PF LTX (GLOVE) ×3 IMPLANT
GOWN STRL REUS W/ TWL LRG LVL3 (GOWN DISPOSABLE) ×2 IMPLANT
GOWN STRL REUS W/TWL LRG LVL3 (GOWN DISPOSABLE) ×4
LENS IOL TECNIS ITEC 23.5 (Intraocular Lens) ×2 IMPLANT
MARKER SKIN DUAL TIP RULER LAB (MISCELLANEOUS) ×3 IMPLANT
NDL FILTER BLUNT 18X1 1/2 (NEEDLE) ×1 IMPLANT
NDL RETROBULBAR .5 NSTRL (NEEDLE) IMPLANT
NEEDLE FILTER BLUNT 18X 1/2SAF (NEEDLE) ×2
NEEDLE FILTER BLUNT 18X1 1/2 (NEEDLE) ×1 IMPLANT
PACK CATARACT BRASINGTON (MISCELLANEOUS) ×3 IMPLANT
PACK EYE AFTER SURG (MISCELLANEOUS) ×3 IMPLANT
PACK OPTHALMIC (MISCELLANEOUS) ×3 IMPLANT
RING MALYGIN 7.0 (MISCELLANEOUS) IMPLANT
SUT ETHILON 10-0 CS-B-6CS-B-6 (SUTURE)
SUT VICRYL  9 0 (SUTURE)
SUT VICRYL 9 0 (SUTURE) IMPLANT
SUTURE EHLN 10-0 CS-B-6CS-B-6 (SUTURE) IMPLANT
SYR 3ML LL SCALE MARK (SYRINGE) ×3 IMPLANT
SYR 5ML LL (SYRINGE) ×3 IMPLANT
SYR TB 1ML LUER SLIP (SYRINGE) ×3 IMPLANT
WATER STERILE IRR 500ML POUR (IV SOLUTION) ×3 IMPLANT
WIPE NON LINTING 3.25X3.25 (MISCELLANEOUS) ×3 IMPLANT

## 2018-10-09 NOTE — Transfer of Care (Signed)
Immediate Anesthesia Transfer of Care Note  Patient: Jasmine Acosta  Procedure(s) Performed: CATARACT EXTRACTION PHACO AND INTRAOCULAR LENS PLACEMENT (IOC) RIGHT (Right Eye)  Patient Location: PACU  Anesthesia Type: MAC  Level of Consciousness: awake, alert  and patient cooperative  Airway and Oxygen Therapy: Patient Spontanous Breathing and Patient connected to supplemental oxygen  Post-op Assessment: Post-op Vital signs reviewed, Patient's Cardiovascular Status Stable, Respiratory Function Stable, Patent Airway and No signs of Nausea or vomiting  Post-op Vital Signs: Reviewed and stable  Complications: No apparent anesthesia complications

## 2018-10-09 NOTE — Op Note (Signed)
LOCATION:  [place]   PREOPERATIVE DIAGNOSIS:    Nuclear sclerotic cataract right eye. H25.11   POSTOPERATIVE DIAGNOSIS:  Nuclear sclerotic cataract right eye.     PROCEDURE:  Phacoemusification with posterior chamber intraocular lens placement of the right eye   LENS:   Implant Name Type Inv. Item Serial No. Manufacturer Lot No. LRB No. Used  [id number] 1 PIECE PRELOADED LENS Intraocular Lens  [id number] [employer]  Right 1     23.5 D   ULTRASOUND TIME: 15 % of 0 minutes, 54 seconds.  CDE 8.34   SURGEON:  Wyonia Hough, MD   ANESTHESIA:  Topical with tetracaine drops and 2% Xylocaine jelly, augmented with 1% preservative-free intracameral lidocaine.    COMPLICATIONS:  None.   DESCRIPTION OF PROCEDURE:  The patient was identified in the holding room and transported to the operating room and placed in the supine position under the operating microscope.  The right eye was identified as the operative eye and it was prepped and draped in the usual sterile ophthalmic fashion.   A 1 millimeter clear-corneal paracentesis was made at the 12:00 position.  0.5 ml of preservative-free 1% lidocaine was injected into the anterior chamber. The anterior chamber was filled with Viscoat viscoelastic.  A 2.4 millimeter keratome was used to make a near-clear corneal incision at the 9:00 position.  A curvilinear capsulorrhexis was made with a cystotome and capsulorrhexis forceps.  Balanced salt solution was used to hydrodissect and hydrodelineate the nucleus.   Phacoemulsification was then used in stop and chop fashion to remove the lens nucleus and epinucleus.  The remaining cortex was then removed using the irrigation and aspiration handpiece. Provisc was then placed into the capsular bag to distend it for lens placement.  A lens was then injected into the capsular bag.  The remaining viscoelastic was aspirated.   Wounds were hydrated with balanced salt solution.  The  anterior chamber was inflated to a physiologic pressure with balanced salt solution.  No wound leaks were noted. Cefuroxime 0.1 ml of a 10mg /ml solution was injected into the anterior chamber for a dose of 1 mg of intracameral antibiotic at the completion of the case.   Timolol and Brimonidine drops were applied to the eye.  The patient was taken to the recovery room in stable condition without complications of anesthesia or surgery.   Aarian Cleaver 10/09/2018, 11:25 AM

## 2018-10-09 NOTE — H&P (Signed)

## 2018-10-09 NOTE — Anesthesia Procedure Notes (Signed)
Procedure Name: MAC Date/Time: 10/09/2018 11:07 AM Performed by: Jeannene Patella, CRNA Pre-anesthesia Checklist: Emergency Drugs available, Suction available, Patient identified, Patient being monitored and Timeout performed Patient Re-evaluated:Patient Re-evaluated prior to induction Oxygen Delivery Method: Nasal cannula

## 2018-10-09 NOTE — Anesthesia Postprocedure Evaluation (Signed)
Anesthesia Post Note  Patient: Jasmine Acosta  Procedure(s) Performed: CATARACT EXTRACTION PHACO AND INTRAOCULAR LENS PLACEMENT (IOC) RIGHT (Right Eye)  Patient location during evaluation: PACU Anesthesia Type: MAC Level of consciousness: awake and alert, oriented and patient cooperative Pain management: pain level controlled Vital Signs Assessment: post-procedure vital signs reviewed and stable Respiratory status: spontaneous breathing, nonlabored ventilation and respiratory function stable Cardiovascular status: blood pressure returned to baseline and stable Postop Assessment: adequate PO intake Anesthetic complications: no    Darrin Nipper

## 2018-10-10 ENCOUNTER — Encounter: Payer: Self-pay | Admitting: Ophthalmology

## 2018-10-12 ENCOUNTER — Ambulatory Visit (INDEPENDENT_AMBULATORY_CARE_PROVIDER_SITE_OTHER): Payer: PPO | Admitting: Vascular Surgery

## 2018-10-12 ENCOUNTER — Ambulatory Visit (INDEPENDENT_AMBULATORY_CARE_PROVIDER_SITE_OTHER): Payer: PPO

## 2018-10-12 ENCOUNTER — Encounter (INDEPENDENT_AMBULATORY_CARE_PROVIDER_SITE_OTHER): Payer: Self-pay | Admitting: Vascular Surgery

## 2018-10-12 VITALS — BP 134/72 | HR 86 | Resp 17 | Ht 61.0 in | Wt 123.0 lb

## 2018-10-12 DIAGNOSIS — I70213 Atherosclerosis of native arteries of extremities with intermittent claudication, bilateral legs: Secondary | ICD-10-CM | POA: Diagnosis not present

## 2018-10-12 DIAGNOSIS — I10 Essential (primary) hypertension: Secondary | ICD-10-CM | POA: Diagnosis not present

## 2018-10-12 DIAGNOSIS — I6523 Occlusion and stenosis of bilateral carotid arteries: Secondary | ICD-10-CM | POA: Diagnosis not present

## 2018-10-12 DIAGNOSIS — E785 Hyperlipidemia, unspecified: Secondary | ICD-10-CM

## 2018-10-12 NOTE — Progress Notes (Signed)
MRN : [id number]  Jasmine Acosta is a 67 y.o. ([birth year]) female who presents with chief complaint of  Chief Complaint  Patient presents with  . Follow-up  .  History of Present Illness: Patient returns in follow-up of multiple vascular issues.  She is about 4 months status post right carotid endarterectomy for high-grade stenosis.  She has done well following surgery and does not currently have any focal neurologic issues.  Her incision is well-healed and she had no perioperative complications. Carotid duplex today reveals a widely patent right carotid endarterectomy with stable 1 to 39% left ICA stenosis. Her biggest complaint now is of claudication symptoms predominantly in the right leg.  She was complaining of leg pain prior to her endarterectomy, but we knew we needed to care for her carotid disease first.  No ulceration or infection.  She is now describing some symptoms worrisome for rest pain with the pain waking her up in the right foot and lower leg at night.  She underwent a CT angiogram ordered by her primary care physician I have independently reviewed the study.  This demonstrates diffuse disease worse on the right than the left with some degree of right iliac disease, bilateral common femoral disease, and what appears to be fairly high-grade femoral-popliteal disease on the right.  There is femoral-popliteal disease on the left as well that appears not quite as bad as the right.  Current Outpatient Medications  Medication Sig Dispense Refill  . amLODipine (NORVASC) 5 MG tablet   11  . clopidogrel (PLAVIX) 75 MG tablet Take 1 tablet (75 mg total) by mouth daily with breakfast. 30 tablet 5  . gabapentin (NEURONTIN) 300 MG capsule Take 300 mg by mouth 3 (three) times daily.     . meloxicam (MOBIC) 15 MG tablet Take 15 mg by mouth daily as needed for pain.    Vladimir Faster Glycol-Propyl Glycol (SYSTANE OP) Apply to eye 3 (three) times daily as needed.    . potassium chloride  (K-DUR,KLOR-CON) 10 MEQ tablet Take 10 mEq by mouth 2 (two) times daily.    . rosuvastatin (CRESTOR) 40 MG tablet Take by mouth.     No current facility-administered medications for this visit.     Past Medical History:  Diagnosis Date  . Carotid artery occlusion   . COPD (chronic obstructive pulmonary disease) ([place]) 07/06/2015  . Headache   . Hypertension   . Hyperthyroidism    ablated with iodine  . Peripheral vascular disease ([place])    Bilateral Carotid Artery Disease    Past Surgical History:  Procedure Laterality Date  . ABDOMINAL HYSTERECTOMY     heavy bleeding  . CATARACT EXTRACTION W/PHACO Right 10/09/2018   Procedure: CATARACT EXTRACTION PHACO AND INTRAOCULAR LENS PLACEMENT ([place]) RIGHT;  Surgeon: Leandrew Koyanagi, MD;  Location: [facility];  Service: Ophthalmology;  Laterality: Right;  . ENDARTERECTOMY Right 06/14/2018   Procedure: ENDARTERECTOMY CAROTID;  Surgeon: Algernon Huxley, MD;  Location: [facility];  Service: Vascular;  Laterality: Right;  . hallux vagus correction Right   . HALLUX VALGUS AUSTIN Left 07/01/2016   Procedure: HALLUX VALGUS AUSTIN left foot;  Surgeon: Sharlotte Alamo, DPM;  Location: [facility];  Service: Podiatry;  Laterality: Left;        Family History  Problem Relation Age of Onset  . Diabetes Mother   . Emphysema Father   . Heart disease Sister   . Heart disease Brother   . Breast cancer Neg Hx   . Ovarian  cancer Neg Hx   . Colon cancer Neg Hx      Social History Social History        Tobacco Use  . Smoking status: Current Every Day Smoker    Packs/day: 0.25    Types: Cigarettes  . Smokeless tobacco: Never Used  Substance Use Topics  . Alcohol use: No  . Drug use: No         Allergies  Allergen Reactions  . Codeine Nausea And Vomiting      REVIEW OF SYSTEMS(Negative unless checked)  Constitutional: [] ?Weight loss[] ?Fever[] ?Chills Cardiac:[] ?Chest pain[] ?Chest  pressure[] ?Palpitations [] ?Shortness of breath when laying flat [] ?Shortness of breath at rest [] ?Shortness of breath with exertion. Vascular: [x] ?Pain in legs with walking[x] ?Pain in legsat rest[] ?Pain in legs when laying flat [x] ?Claudication [] ?Pain in feet when walking [] ?Pain in feet at rest [] ?Pain in feet when laying flat [] ?History of DVT [] ?Phlebitis [] ?Swelling in legs [] ?Varicose veins [] ?Non-healing ulcers Pulmonary: [] ?Uses home oxygen [] ?Productive cough[] ?Hemoptysis [] ?Wheeze [x] ?COPD [] ?Asthma Neurologic: [] ?Dizziness [] ?Blackouts [] ?Seizures [] ?History of stroke [] ?History of TIA[] ?Aphasia [] ?Temporary blindness[] ?Dysphagia [] ?Weaknessor numbness in arms [] ?Weakness or numbnessin legs Musculoskeletal: [x] ?Arthritis [] ?Joint swelling [x] ?Joint pain [] ?Low back pain Hematologic:[] ?Easy bruising[] ?Easy bleeding [] ?Hypercoagulable state [] ?Anemic [] ?Hepatitis Gastrointestinal:[] ?Blood in stool[] ?Vomiting blood[x] ?Gastroesophageal reflux/heartburn[] ?Abdominal pain Genitourinary: [] ?Chronic kidney disease [] ?Difficulturination [] ?Frequenturination [] ?Burning with urination[] ?Hematuria Skin: [] ?Rashes [] ?Ulcers [] ?Wounds Psychological: [] ?History of anxiety[] ?History of major depression.     Physical Examination  Vitals:   10/12/18 1108  BP: 134/72  Pulse: 86  Resp: 17  Weight: 123 lb (55.8 kg)  Height: 5\' 1"  (1.549 m)   Body mass index is 23.24 kg/m. Gen:  WD/WN, NAD Head: [place]/AT, No temporalis wasting. Ear/Nose/Throat: Hearing grossly intact, nares w/o erythema or drainage, trachea midline Eyes: Conjunctiva clear. Sclera non-icteric Neck: Supple.  no bruit  Pulmonary:  Good air movement, equal and clear to auscultation bilaterally.  Cardiac: RRR, No JVD Vascular:  Vessel Right Left  Radial Palpable Palpable                  Femoral  1+ palpable  2+  palpable  Popliteal  not palpable  1+ palpable  PT  not palpable  1+ palpable  DP  trace palpable  not palpable   Gastrointestinal: soft, non-tender/non-distended.  Musculoskeletal: M/S 5/5 throughout.  No deformity or atrophy. No edema. Neurologic: CN 2-12 intact. Sensation grossly intact in extremities.  Symmetrical.  Speech is fluent. Motor exam as listed above. Psychiatric: Judgment intact, Mood & affect appropriate for pt's clinical situation. Dermatologic: No rashes or ulcers noted.  No cellulitis or open wounds. Lymph : No Cervical, Axillary, or Inguinal lymphadenopathy.     CBC Lab Results  Component Value Date   WBC 13.0 (H) 06/15/2018   HGB 10.0 (L) 06/15/2018   HCT 29.6 (L) 06/15/2018   MCV 96.1 06/15/2018   PLT 165 06/15/2018    BMET    Component Value Date/Time   NA 140 06/15/2018 0456   K 3.9 06/15/2018 0456   CL 110 06/15/2018 0456   CO2 24 06/15/2018 0456   GLUCOSE 108 (H) 06/15/2018 0456   BUN 12 06/15/2018 0456   CREATININE 0.80 08/30/2018 0907   CALCIUM 8.6 (L) 06/15/2018 0456   GFRNONAA >60 06/15/2018 0456   GFRAA >60 06/15/2018 0456   CrCl cannot be calculated (Patient's most recent lab result is older than the maximum 21 days allowed.).  COAG Lab Results  Component Value Date   INR 1.03 06/07/2018    Radiology No results found.    Assessment/Plan Essential  hypertension blood pressure control important in reducing the progression of atherosclerotic disease. On appropriate oral medications.   Hyperlipidemia lipid control important in reducing the progression of atherosclerotic disease. Continue statin therapy  Bilateral carotid artery disease (HCC) Carotid duplex today reveals a widely patent right carotid endarterectomy with stable 1 to 39% left ICA stenosis.  Doing well after surgery.  Continue current medical regimen.  Recheck in 1 year.  Atherosclerosis of native arteries of extremity with intermittent claudication [facility]) She  underwent a CT angiogram ordered by her primary care physician I have independently reviewed the study.  This demonstrates diffuse disease worse on the right than the left with some degree of right iliac disease, bilateral common femoral disease, and what appears to be fairly high-grade femoral-popliteal disease on the right.  There is femoral-popliteal disease on the left as well that appears not quite as bad as the right.  Recommend:  The patient has experienced increased symptoms and is now describing lifestyle limiting claudication and mild rest pain.   Given the severity of the patient's right lower extremity symptoms the patient should undergo angiography and intervention.  Risk and benefits were reviewed the patient.  Indications for the procedure were reviewed.  All questions were answered, the patient agrees to proceed.   The patient should continue walking and begin a more formal exercise program.  The patient should continue antiplatelet therapy and aggressive treatment of the lipid abnormalities  The patient will follow up with me after the angiogram.     Leotis Pain, MD  10/12/2018 12:33 PM    This note was created with Dragon medical transcription system.  Any errors from dictation are purely unintentional

## 2018-10-12 NOTE — Patient Instructions (Signed)
Endovascular Therapy for Peripheral Arterial Disease, Care After  This sheet gives you information about how to care for yourself after your procedure. Your health care provider may also give you more specific instructions. If you have problems or questions, contact your health care provider.  What can I expect after the procedure?  After the procedure, it is common to have:   Pain.   Soreness and bruising around your puncture or incision (access site).   Fatigue.  Follow these instructions at home:  Access site care   Follow instructions from your health care provider about how to take care of your access site. Make sure you:  ? Wash your hands with soap and water before you change your bandage (dressing). If soap and water are not available, use hand sanitizer.  ? Change your dressing as told by your health care provider.  ? Leave stitches (sutures), skin glue, or adhesive strips in place. If adhesive strip edges start to loosen and curl up, you may trim the loose edges. Do not remove adhesive strips or skin glue completely unless your health care provider tells you to do that.   Check your access site every day for signs of infection. Check for:  ? Redness, swelling, or pain.  ? A lump or bump.  ? Fluid or blood.  ? Warmth.  ? Pus or a bad smell.  Medicines   Take over-the-counter and prescription medicines only as told by your health care provider. You may need to take medicines to prevent blood clots and to lower your cholesterol.   If you were prescribed antibiotic medicine, take it as told by your health care provider. Do not stop taking the antibiotic even if you start to feel better.  Driving   Do not drive until your health care provider approves. You should:  ? Not drive for 24 hours if you were given a medicine to help you relax (sedative) during your procedure.  ? Not drive or use heavy machinery while taking prescription pain medicine.  Activity   Do not lift anything that is heavier than 10  lb (4.5 kg) until your health care provider says that it is safe. You may have this lifting limit for several days.   Return to your normal activities as told by your health care provider. Ask your health care provider what activities are safe for you.  ? Avoid activity that requires a lot of energy, such as exercise and sports, as told by your health care provider.  ? Avoid sexual activity until your health care provider says it is safe.   Follow your exercise plan as told by your health care provider.  Eating and drinking     Drink fluids as instructed to help wash (flush) dye used during the procedure out of your body.   Follow instructions from your health care provider about eating or drinking restrictions. You may need to eat a diet that is low in salt (sodium) and fat.   Avoid drinking alcohol.  General instructions   Do not take baths, swim, or use a hot tub until your health care provider approves. You may take showers.   Do not use any products that contain nicotine or tobacco, such as cigarettes and e-cigarettes. If you need help quitting, ask your health care provider.   Keep all follow-up visits as told by your health care provider. This is important.  Contact a health care provider if:   You have a fever.   You   have severe pain that does not get better with medicine.   You have redness, swelling, or pain around your access site.   You have a fever.   You have a lump or bump at your access site.  Get help right away if:     You have fluid or blood coming from your access site. If this happens, lie down on your back and apply pressure to the area.   You have chest pain.   You have problems breathing.   You have pain, numbness, or tingling in your legs.   You faint.   You have any symptoms of a stroke. "BE FAST" is an easy way to remember the main warning signs of a stroke:  ? B - Balance. Signs are dizziness, sudden trouble walking, or loss of balance.  ? E - Eyes. Signs are trouble  seeing or a sudden change in vision.  ? F - Face. Signs are sudden weakness or numbness of the face, or the face or eyelid drooping on one side.  ? A - Arms. Signs are weakness or numbness in an arm. This happens suddenly and usually on one side of the body.  ? S - Speech. Signs are sudden trouble speaking, slurred speech, or trouble understanding what people say.  ? T - Time. Time to call emergency services. Write down what time symptoms started.   You have other signs of a stroke, such as:  ? A sudden, severe headache with no known cause.  ? Nausea or vomiting.  ? Seizure.  These symptoms may represent a serious problem that is an emergency. Do not wait to see if the symptoms will go away. Get medical help right away. Call your local emergency services (911 in the [place] Do not drive yourself to the hospital.  Summary   After the procedure, it is common to have pain and soreness near your puncture or incision (access site).   Check your access site every day for signs of infection, such as redness, swelling, or pain.   You may need to take medicines to prevent blood clots and to lower your cholesterol.   If you have any signs of a stroke, get help right away.  This information is not intended to replace advice given to you by your health care provider. Make sure you discuss any questions you have with your health care provider.  Document Released: 02/01/2017 Document Revised: 02/01/2017 Document Reviewed: 02/01/2017  Elsevier Interactive Patient Education  2019 [employer]

## 2018-10-12 NOTE — Assessment & Plan Note (Signed)
She underwent a CT angiogram ordered by her primary care physician I have independently reviewed the study.  This demonstrates diffuse disease worse on the right than the left with some degree of right iliac disease, bilateral common femoral disease, and what appears to be fairly high-grade femoral-popliteal disease on the right.  There is femoral-popliteal disease on the left as well that appears not quite as bad as the right.  Recommend:  The patient has experienced increased symptoms and is now describing lifestyle limiting claudication and mild rest pain.   Given the severity of the patient's right lower extremity symptoms the patient should undergo angiography and intervention.  Risk and benefits were reviewed the patient.  Indications for the procedure were reviewed.  All questions were answered, the patient agrees to proceed.   The patient should continue walking and begin a more formal exercise program.  The patient should continue antiplatelet therapy and aggressive treatment of the lipid abnormalities  The patient will follow up with me after the angiogram.

## 2018-10-12 NOTE — Assessment & Plan Note (Signed)
Carotid duplex today reveals a widely patent right carotid endarterectomy with stable 1 to 39% left ICA stenosis.  Doing well after surgery.  Continue current medical regimen.  Recheck in 1 year.

## 2018-10-15 ENCOUNTER — Encounter (INDEPENDENT_AMBULATORY_CARE_PROVIDER_SITE_OTHER): Payer: Self-pay

## 2018-10-31 ENCOUNTER — Other Ambulatory Visit (INDEPENDENT_AMBULATORY_CARE_PROVIDER_SITE_OTHER): Payer: Self-pay | Admitting: Nurse Practitioner

## 2018-11-02 ENCOUNTER — Other Ambulatory Visit: Payer: Self-pay

## 2018-11-02 ENCOUNTER — Encounter
Admission: RE | Admit: 2018-11-02 | Discharge: 2018-11-02 | Disposition: A | Discharge status: Home / Self Care | Payer: PPO | Source: Ambulatory Visit | Attending: Vascular Surgery | Admitting: Vascular Surgery

## 2018-11-02 DIAGNOSIS — E785 Hyperlipidemia, unspecified: Secondary | ICD-10-CM | POA: Diagnosis not present

## 2018-11-02 DIAGNOSIS — Z01812 Encounter for preprocedural laboratory examination: Secondary | ICD-10-CM | POA: Insufficient documentation

## 2018-11-02 DIAGNOSIS — F17210 Nicotine dependence, cigarettes, uncomplicated: Secondary | ICD-10-CM | POA: Diagnosis not present

## 2018-11-02 DIAGNOSIS — I10 Essential (primary) hypertension: Secondary | ICD-10-CM | POA: Diagnosis not present

## 2018-11-02 DIAGNOSIS — I2510 Atherosclerotic heart disease of native coronary artery without angina pectoris: Secondary | ICD-10-CM | POA: Diagnosis not present

## 2018-11-02 DIAGNOSIS — J449 Chronic obstructive pulmonary disease, unspecified: Secondary | ICD-10-CM | POA: Diagnosis not present

## 2018-11-02 DIAGNOSIS — Z79899 Other long term (current) drug therapy: Secondary | ICD-10-CM | POA: Diagnosis not present

## 2018-11-02 DIAGNOSIS — I70213 Atherosclerosis of native arteries of extremities with intermittent claudication, bilateral legs: Secondary | ICD-10-CM | POA: Diagnosis not present

## 2018-11-02 LAB — BUN: BUN: 13 mg/dL (ref 8–23)

## 2018-11-02 LAB — CREATININE, SERUM
Creatinine, Ser: 0.76 mg/dL (ref 0.44–1.00)
GFR calc Af Amer: >60 mL/min (ref >60)
GFR calc non Af Amer: >60 mL/min (ref >60)

## 2018-11-02 MED ORDER — ACETAMINOPHEN 325 MG PO TABS
650.0000 mg | ORAL_TABLET | Freq: Once | ORAL | Status: DC | PRN
  Filled 2018-11-02: qty 2

## 2018-11-02 MED ORDER — ONDANSETRON HCL 4 MG/2ML IJ SOLN
4.0000 mg | Freq: Once | INTRAMUSCULAR | Status: DC | PRN
  Filled 2018-11-02: qty 2

## 2018-11-02 MED ORDER — ACETAMINOPHEN 160 MG/5ML PO SOLN
325.0000 mg | ORAL | Status: DC | PRN
  Filled 2018-11-02: qty 20.3

## 2018-11-02 NOTE — Patient Instructions (Signed)
FOLLOW INSTRUCTIONS GIVEN TO YOU BY [facility].  Your procedure is scheduled with Dr. Lucky Cowboy                            On:   Monday, November 05, 2018    Go to [facility]' on the first floor of the [facility].  Do not eat or drink 8 hours prior to your procedure.    Please call Dr Lucky Cowboy office with any questions or concerns: [phone].  You will need to have someone drive you home and stay with you the night of the procedure.

## 2018-11-04 MED ORDER — DEXTROSE 5 % IV SOLN
2.0000 g | Freq: Once | INTRAVENOUS | Status: AC
  Administered 2018-11-05: 2 g via INTRAVENOUS
  Filled 2018-11-04: qty 20

## 2018-11-05 ENCOUNTER — Other Ambulatory Visit: Payer: Self-pay

## 2018-11-05 ENCOUNTER — Encounter: Payer: Self-pay | Admitting: Emergency Medicine

## 2018-11-05 ENCOUNTER — Encounter
Admission: RE | Disposition: A | Discharge status: Home / Self Care | Payer: Self-pay | Source: Home / Self Care | Attending: Vascular Surgery

## 2018-11-05 ENCOUNTER — Ambulatory Visit
Admission: RE | Admit: 2018-11-05 | Discharge: 2018-11-05 | Disposition: A | Discharge status: Home / Self Care | Payer: PPO | Attending: Vascular Surgery | Admitting: Vascular Surgery

## 2018-11-05 DIAGNOSIS — I2510 Atherosclerotic heart disease of native coronary artery without angina pectoris: Secondary | ICD-10-CM | POA: Insufficient documentation

## 2018-11-05 DIAGNOSIS — I10 Essential (primary) hypertension: Secondary | ICD-10-CM | POA: Insufficient documentation

## 2018-11-05 DIAGNOSIS — E785 Hyperlipidemia, unspecified: Secondary | ICD-10-CM | POA: Insufficient documentation

## 2018-11-05 DIAGNOSIS — Z79899 Other long term (current) drug therapy: Secondary | ICD-10-CM | POA: Insufficient documentation

## 2018-11-05 DIAGNOSIS — J449 Chronic obstructive pulmonary disease, unspecified: Secondary | ICD-10-CM | POA: Insufficient documentation

## 2018-11-05 DIAGNOSIS — I70219 Atherosclerosis of native arteries of extremities with intermittent claudication, unspecified extremity: Secondary | ICD-10-CM

## 2018-11-05 DIAGNOSIS — I70213 Atherosclerosis of native arteries of extremities with intermittent claudication, bilateral legs: Secondary | ICD-10-CM | POA: Insufficient documentation

## 2018-11-05 DIAGNOSIS — F17210 Nicotine dependence, cigarettes, uncomplicated: Secondary | ICD-10-CM | POA: Insufficient documentation

## 2018-11-05 HISTORY — PX: LOWER EXTREMITY ANGIOGRAPHY: CATH118251

## 2018-11-05 SURGERY — LOWER EXTREMITY ANGIOGRAPHY
Anesthesia: Moderate Sedation | Laterality: Right

## 2018-11-05 MED ORDER — SODIUM CHLORIDE 0.9% FLUSH: 3.0000 mL | Freq: Two times a day (BID) | INTRAVENOUS | Status: DC

## 2018-11-05 MED ORDER — SODIUM CHLORIDE 0.9 % IV SOLN: 250.0000 mL | INTRAVENOUS | Status: DC | PRN

## 2018-11-05 MED ORDER — ONDANSETRON HCL 4 MG/2ML IJ SOLN
4.0000 mg | Freq: Four times a day (QID) | INTRAMUSCULAR | Status: DC | PRN
  Administered 2018-11-05: 4 mg via INTRAVENOUS

## 2018-11-05 MED ORDER — HYDRALAZINE HCL 20 MG/ML IJ SOLN: 5.0000 mg | INTRAMUSCULAR | Status: DC | PRN

## 2018-11-05 MED ORDER — LABETALOL HCL 5 MG/ML IV SOLN: 10.0000 mg | INTRAVENOUS | Status: DC | PRN

## 2018-11-05 MED ORDER — ONDANSETRON HCL 4 MG/2ML IJ SOLN
INTRAMUSCULAR | Status: AC
  Administered 2018-11-05: 4 mg via INTRAVENOUS
  Filled 2018-11-05: qty 2

## 2018-11-05 MED ORDER — HYDROMORPHONE HCL 1 MG/ML IJ SOLN: 1.0000 mg | Freq: Once | INTRAMUSCULAR | Status: DC | PRN

## 2018-11-05 MED ORDER — MIDAZOLAM HCL 5 MG/5ML IJ SOLN
INTRAMUSCULAR | Status: AC
  Filled 2018-11-05: qty 5

## 2018-11-05 MED ORDER — ONDANSETRON HCL 4 MG/2ML IJ SOLN: 4.0000 mg | Freq: Four times a day (QID) | INTRAMUSCULAR | Status: DC | PRN

## 2018-11-05 MED ORDER — SODIUM CHLORIDE 0.9% FLUSH: 3.0000 mL | INTRAVENOUS | Status: DC | PRN

## 2018-11-05 MED ORDER — ASPIRIN EC 81 MG PO TBEC: 81.0000 mg | DELAYED_RELEASE_TABLET | Freq: Every day | ORAL | 2 refills | Status: DC

## 2018-11-05 MED ORDER — ASPIRIN EC 81 MG PO TBEC
DELAYED_RELEASE_TABLET | ORAL | Status: AC
  Administered 2018-11-05: 81 mg via ORAL
  Filled 2018-11-05: qty 1

## 2018-11-05 MED ORDER — IOPAMIDOL (ISOVUE-300) INJECTION 61%
INTRAVENOUS | Status: DC | PRN
  Administered 2018-11-05: 70 mL via INTRA_ARTERIAL

## 2018-11-05 MED ORDER — FENTANYL CITRATE (PF) 100 MCG/2ML IJ SOLN
INTRAMUSCULAR | Status: AC
  Filled 2018-11-05: qty 2

## 2018-11-05 MED ORDER — ASPIRIN EC 81 MG PO TBEC
81.0000 mg | DELAYED_RELEASE_TABLET | Freq: Every day | ORAL | Status: DC
  Administered 2018-11-05: 81 mg via ORAL

## 2018-11-05 MED ORDER — HEPARIN SODIUM (PORCINE) 1000 UNIT/ML IJ SOLN
INTRAMUSCULAR | Status: DC | PRN
  Administered 2018-11-05: 4000 [IU] via INTRAVENOUS

## 2018-11-05 MED ORDER — FENTANYL CITRATE (PF) 100 MCG/2ML IJ SOLN
INTRAMUSCULAR | Status: DC | PRN
  Administered 2018-11-05 (×2): 12.5 ug via INTRAVENOUS
  Administered 2018-11-05: 25 ug via INTRAVENOUS
  Administered 2018-11-05: 12.5 ug via INTRAVENOUS
  Administered 2018-11-05: 50 ug via INTRAVENOUS
  Administered 2018-11-05: 25 ug via INTRAVENOUS

## 2018-11-05 MED ORDER — HEPARIN SODIUM (PORCINE) 1000 UNIT/ML IJ SOLN
INTRAMUSCULAR | Status: AC
  Filled 2018-11-05: qty 1

## 2018-11-05 MED ORDER — SODIUM CHLORIDE 0.9 % IV SOLN
INTRAVENOUS | Status: DC
  Administered 2018-11-05: 07:00:00 via INTRAVENOUS

## 2018-11-05 MED ORDER — ACETAMINOPHEN 325 MG PO TABS: 650.0000 mg | ORAL_TABLET | ORAL | Status: DC | PRN

## 2018-11-05 MED ORDER — MIDAZOLAM HCL 2 MG/2ML IJ SOLN
INTRAMUSCULAR | Status: DC | PRN
  Administered 2018-11-05: 1 mg via INTRAVENOUS
  Administered 2018-11-05 (×3): 0.5 mg via INTRAVENOUS
  Administered 2018-11-05: 1 mg via INTRAVENOUS
  Administered 2018-11-05: 2 mg via INTRAVENOUS

## 2018-11-05 MED ORDER — SODIUM CHLORIDE 0.9 % IV SOLN: INTRAVENOUS | Status: DC

## 2018-11-05 SURGICAL SUPPLY — 19 items
BALLN LUTONIX 5X150X130 (BALLOONS) ×3
BALLN LUTONIX AV 8X40X75 (BALLOONS) ×3
BALLN ULTRVRSE 8X40X75C (BALLOONS) ×3
BALLOON LUTONIX 5X150X130 (BALLOONS) ×1 IMPLANT
BALLOON LUTONIX AV 8X40X75 (BALLOONS) ×1 IMPLANT
BALLOON ULTRVRSE 8X40X75C (BALLOONS) ×1 IMPLANT
CATH PIG 70CM (CATHETERS) ×3 IMPLANT
DEVICE PRESTO INFLATION (MISCELLANEOUS) ×6 IMPLANT
DEVICE STARCLOSE SE CLOSURE (Vascular Products) ×6 IMPLANT
GLIDEWIRE ADV .035X180CM (WIRE) ×3 IMPLANT
GLIDEWIRE ADV .035X260CM (WIRE) ×3 IMPLANT
PACK ANGIOGRAPHY (CUSTOM PROCEDURE TRAY) ×3 IMPLANT
SHEATH ANL2 6FRX45 HC (SHEATH) ×3 IMPLANT
SHEATH BRITE TIP 5FRX11 (SHEATH) ×3 IMPLANT
SHEATH BRITE TIP 6FR X 23 (SHEATH) ×6 IMPLANT
STENT LIFESTREAM 7X26X80 (Permanent Stent) ×6 IMPLANT
SYR MEDRAD MARK V 150ML (SYRINGE) ×3 IMPLANT
TUBING CONTRAST HIGH PRESS 72 (TUBING) ×3 IMPLANT
WIRE J 3MM .035X145CM (WIRE) ×3 IMPLANT

## 2018-11-05 NOTE — Discharge Instructions (Signed)
Moderate Conscious Sedation, Adult, Care After °These instructions provide you with information about caring for yourself after your procedure. Your health care provider may also give you more specific instructions. Your treatment has been planned according to current medical practices, but problems sometimes occur. Call your health care provider if you have any problems or questions after your procedure. °What can I expect after the procedure? °After your procedure, it is common: °· To feel sleepy for several hours. °· To feel clumsy and have poor balance for several hours. °· To have poor judgment for several hours. °· To vomit if you eat too soon. °Follow these instructions at home: °For at least 24 hours after the procedure: ° °· Do not: °? Participate in activities where you could fall or become injured. °? Drive. °? Use heavy machinery. °? Drink alcohol. °? Take sleeping pills or medicines that cause drowsiness. °? Make important decisions or sign legal documents. °? Take care of children on your own. °· Rest. °Eating and drinking °· Follow the diet recommended by your health care provider. °· If you vomit: °? Drink water, juice, or soup when you can drink without vomiting. °? Make sure you have little or no nausea before eating solid foods. °General instructions °· Have a responsible adult stay with you until you are awake and alert. °· Take over-the-counter and prescription medicines only as told by your health care provider. °· If you smoke, do not smoke without supervision. °· Keep all follow-up visits as told by your health care provider. This is important. °Contact a health care provider if: °· You keep feeling nauseous or you keep vomiting. °· You feel light-headed. °· You develop a rash. °· You have a fever. °Get help right away if: °· You have trouble breathing. °This information is not intended to replace advice given to you by your health care provider. Make sure you discuss any questions you have  with your health care provider. °Document Released: 07/31/2013 Document Revised: 03/14/2016 Document Reviewed: 01/30/2016 °Elsevier Interactive Patient Education © 2019 [employer] Inc. °Femoral Site Care °This sheet gives you information about how to care for yourself after your procedure. Your health care provider may also give you more specific instructions. If you have problems or questions, contact your health care provider. °What can I expect after the procedure? °After the procedure, it is common to have: °· Bruising that usually fades within 1-2 weeks. °· Tenderness at the site. °Follow these instructions at home: °Wound care °· Follow instructions from your health care provider about how to take care of your insertion site. Make sure you: °? Wash your hands with soap and water before you change your bandage (dressing). If soap and water are not available, use hand sanitizer. °? Change your dressing as told by your health care provider. °? Leave stitches (sutures), skin glue, or adhesive strips in place. These skin closures may need to stay in place for 2 weeks or longer. If adhesive strip edges start to loosen and curl up, you may trim the loose edges. Do not remove adhesive strips completely unless your health care provider tells you to do that. °· Do not take baths, swim, or use a hot tub until your health care provider approves. °· You may shower 24-48 hours after the procedure or as told by your health care provider. °? Gently wash the site with plain soap and water. °? Pat the area dry with a clean towel. °? Do not rub the site. This may cause   bleeding. °· Do not apply powder or lotion to the site. Keep the site clean and dry. °· Check your femoral site every day for signs of infection. Check for: °? Redness, swelling, or pain. °? Fluid or blood. °? Warmth. °? Pus or a bad smell. °Activity °· For the first 2-3 days after your procedure, or as long as directed: °? Avoid climbing stairs as much as  possible. °? Do not squat. °· Do not lift anything that is heavier than 10 lb (4.5 kg), or the limit that you are told, until your health care provider says that it is safe. °· Rest as directed. °? Avoid sitting for a long time without moving. Get up to take short walks every 1-2 hours. °· Do not drive for 24 hours if you were given a medicine to help you relax (sedative). °General instructions °· Take over-the-counter and prescription medicines only as told by your health care provider. °· Keep all follow-up visits as told by your health care provider. This is important. °Contact a health care provider if you have: °· A fever or chills. °· You have redness, swelling, or pain around your insertion site. °Get help right away if: °· The catheter insertion area swells very fast. °· You pass out. °· You suddenly start to sweat or your skin gets clammy. °· The catheter insertion area is bleeding, and the bleeding does not stop when you hold steady pressure on the area. °· The area near or just beyond the catheter insertion site becomes pale, cool, tingly, or numb. °These symptoms may represent a serious problem that is an emergency. Do not wait to see if the symptoms will go away. Get medical help right away. Call your local emergency services (911 in the [place] Do not drive yourself to the hospital. °Summary °· After the procedure, it is common to have bruising that usually fades within 1-2 weeks. °· Check your femoral site every day for signs of infection. °· Do not lift anything that is heavier than 10 lb (4.5 kg), or the limit that you are told, until your health care provider says that it is safe. °This information is not intended to replace advice given to you by your health care provider. Make sure you discuss any questions you have with your health care provider. °Document Released: 06/13/2014 Document Revised: 10/23/2017 Document Reviewed: 10/23/2017 °Elsevier Interactive Patient Education © 2019 [employer]vier  Inc. ° °

## 2018-11-05 NOTE — H&P (Signed)
[facility] History & Physical Update  The patient was interviewed and re-examined.  The patient's previous History and Physical has been reviewed and is unchanged.  There is no change in the plan of care. We plan to proceed with the scheduled procedure.  Leotis Pain, MD  11/05/2018, 8:07 AM

## 2018-11-05 NOTE — Op Note (Signed)
[facility]  Percutaneous Study/Intervention Procedural Note   Date of Surgery: 11/05/2018  Surgeon(s):Alaiah Lundy    Assistants:none  Pre-operative Diagnosis: PAD with claudication bilateral lower extremities right greater than left  Post-operative diagnosis:  Same  Procedure(s) Performed:             1.  Ultrasound guidance for vascular access bilateral femoral arteries             2.  Catheter placement into right SFA from left femoral approach             3.  Aortogram and selective right lower extremity angiogram             4.  Percutaneous transluminal angioplasty of right SFA with 5 mm diameter by 15 cm length Lutonix drug-coated angioplasty balloon             5.   Kissing stent placements with lifestream stents to both common iliac arteries up to the terminal aorta with two 7 mm diameter by 26 mm length stents postdilated with 8 mm balloons  6.  StarClose closure device bilateral femoral arteries  EBL: 10 cc  Contrast: 70 cc  Fluoro Time: 5 minutes  Moderate Conscious Sedation Time: approximately 35 minutes using 5.5 mg of Versed and 137.5 mcg of Fentanyl              Indications:  Patient is a 68 y.o.female with disabling claudication symptoms. The patient has noninvasive study showing aortoiliac disease and femoral-popliteal disease worse on the right than the left on the CT scan. The patient is brought in for angiography for further evaluation and potential treatment. Risks and benefits are discussed and informed consent is obtained.   Procedure:  The patient was identified and appropriate procedural time out was performed.  The patient was then placed supine on the table and prepped and draped in the usual sterile fashion. Moderate conscious sedation was administered during a face to face encounter with the patient throughout the procedure with my supervision of the RN administering medicines and monitoring the patient's vital signs, pulse oximetry,  telemetry and mental status throughout from the start of the procedure until the patient was taken to the recovery room. Ultrasound was used to evaluate the left common femoral artery.  It was patent but reasonably heavily calcified and diseased.  A digital ultrasound image was acquired.  A Seldinger needle was used to access the left common femoral artery under direct ultrasound guidance and a permanent image was performed.  A 0.035 J wire was advanced without resistance and a 5Fr sheath was placed.  Pigtail catheter was placed into the aorta and an AP aortogram was performed.  I also pulled the pigtail catheter down to the distal aorta and performed oblique angiograms to help opacify the iliac vessels.  This demonstrated normal renal arteries.  The aorta was reasonably normal until its distal segment where there was significant atherosclerotic occlusive disease that spilled into both iliac arteries.  On the left, the disease was mild but on the right it was quite high-grade in the 80 to 90% range.  The distal common iliac arteries normalized in the external iliac arteries appear to be reasonably normal. I then crossed the aortic bifurcation and advanced to the right femoral head down into the proximal right superficial femoral artery. Selective right lower extremity angiogram was then performed. This demonstrated atherosclerotic disease of the femoral bifurcation with what appeared to be mild to moderate stenosis in  the common femoral artery.  The SFA was reasonably normal proximally, but in the mid to distal segment there was moderate disease in the 60% range with 3 areas of disease in short proximity in the moderate range.  The popliteal artery then normalized.  There was then two-vessel runoff distally. It was felt that it was in the patient's best interest to proceed with intervention after these images to avoid a second procedure and a larger amount of contrast and fluoroscopy based off of the findings from  the initial angiogram. The patient was systemically heparinized and a 6 [place] Ansell sheath was then placed over the [employer] wire. I then used a Kumpe catheter and the advantage wire to navigate through the SFA stenoses and parking the wire in the distal popliteal artery.  Treatment of the right SFA was then performed with a 5 mm diameter by 15 cm length Lutonix drug-coated angioplasty balloon inflated to 8 atm for 1 minute.  Completion imaging showed only about a 15 to 20% residual stenosis in the right SFA.  I then exchanged for a 21 cm sheath in the left femoral and gained access to the right femoral artery.  Ultrasound was used to visualize the right femoral artery.  It was calcified and somewhat diseased but patent.  It was accessed under direct ultrasound guidance without difficulty with a Seldinger needle and a permanent image was recorded.  A 21 cm 6 French sheath was placed in the right femoral artery as well.  Both of these sheaths were taken into the aorta.  I then selected 7 mm diameter by 26 mm length lifestream stents and deployed these in a kissing balloon fashion from the terminal aorta down to the common iliac artery bilaterally encompassing the lesion.  These were then postdilated with an 8 mm balloon with excellent angiographic completion result and less than 10% residual stenosis. I elected to terminate the procedure. The sheath was removed and StarClose closure device was deployed in the left femoral artery with excellent hemostatic result.  Of note, there was at least moderate stenosis in the left common femoral artery in the left femoral bifurcation from atherosclerotic occlusive disease just below the access site.  Similarly, a Star close closure device was deployed in the right femoral artery with an excellent hemostatic result after removal of the sheath on that side as well.  The patient was taken to the recovery room in stable condition having tolerated the procedure  well.  Findings:               Aortogram:  Normal renal arteries.  The aorta was reasonably normal until its distal segment where there was significant atherosclerotic occlusive disease that spilled into both iliac arteries.  On the left, the disease was mild but on the right it was quite high-grade in the 80 to 90% range.  The distal common iliac arteries normalized in the external iliac arteries appear to be reasonably normal.             Right lower Extremity:  This demonstrated atherosclerotic disease of the femoral bifurcation with what appeared to be mild to moderate stenosis in the common femoral artery.  The SFA was reasonably normal proximally, but in the mid to distal segment there was moderate disease in the 60% range with 3 areas of disease in short proximity in the moderate range.  The popliteal artery then normalized.  There was then two-vessel runoff distally   Disposition: Patient was taken to the  recovery room in stable condition having tolerated the procedure well.  Complications: None  Jasmine Acosta 11/05/2018 9:23 AM   This note was created with Dragon Medical transcription system. Any errors in dictation are purely unintentional.

## 2018-12-04 ENCOUNTER — Ambulatory Visit (INDEPENDENT_AMBULATORY_CARE_PROVIDER_SITE_OTHER): Payer: PPO

## 2018-12-04 ENCOUNTER — Ambulatory Visit (INDEPENDENT_AMBULATORY_CARE_PROVIDER_SITE_OTHER): Payer: PPO | Admitting: Vascular Surgery

## 2018-12-04 ENCOUNTER — Encounter (INDEPENDENT_AMBULATORY_CARE_PROVIDER_SITE_OTHER): Payer: Self-pay | Admitting: Vascular Surgery

## 2018-12-04 VITALS — BP 126/70 | HR 86 | Resp 12 | Ht 61.0 in | Wt 127.2 lb

## 2018-12-04 DIAGNOSIS — I739 Peripheral vascular disease, unspecified: Secondary | ICD-10-CM | POA: Diagnosis not present

## 2018-12-04 DIAGNOSIS — I70213 Atherosclerosis of native arteries of extremities with intermittent claudication, bilateral legs: Secondary | ICD-10-CM | POA: Diagnosis not present

## 2018-12-04 DIAGNOSIS — E785 Hyperlipidemia, unspecified: Secondary | ICD-10-CM

## 2018-12-04 DIAGNOSIS — I10 Essential (primary) hypertension: Secondary | ICD-10-CM | POA: Diagnosis not present

## 2018-12-04 DIAGNOSIS — I6523 Occlusion and stenosis of bilateral carotid arteries: Secondary | ICD-10-CM

## 2018-12-04 DIAGNOSIS — F17210 Nicotine dependence, cigarettes, uncomplicated: Secondary | ICD-10-CM | POA: Diagnosis not present

## 2018-12-04 NOTE — Progress Notes (Signed)
MRN : [id number]  Jasmine Acosta is a 69 y.o. (1951/03/10) female who presents with chief complaint of  Chief Complaint  Patient presents with  . Follow-up  .  History of Present Illness: Patient returns today in follow up of multiple vascular issues.  She is about 6 months status post right carotid endarterectomy and is doing well from that.  That is scheduled to be rechecked later this year.  No new focal neurologic symptoms.  Last month, she underwent lower extremity revascularization for claudication symptoms.  Her noninvasive studies show improvement with normal ABIs bilaterally of 1.02 on the right and 0.92 on the left with triphasic waveforms.  Her right leg feels a little better but she still has a fair bit of pain radiating down to her foot and lower leg.  She does have a history of back issues and this may be neuropathic in nature at this point.  Current Outpatient Medications  Medication Sig Dispense Refill  . amLODipine (NORVASC) 5 MG tablet Take 5 mg by mouth daily.   11  . aspirin EC 81 MG tablet Take 1 tablet (81 mg total) by mouth daily. 150 tablet 2  . clopidogrel (PLAVIX) 75 MG tablet Take 1 tablet (75 mg total) by mouth daily with breakfast. 30 tablet 5  . meloxicam (MOBIC) 15 MG tablet Take 15 mg by mouth daily as needed for pain.    Vladimir Faster Glycol-Propyl Glycol (SYSTANE OP) Apply to eye 3 (three) times daily as needed.    . potassium chloride (K-DUR,KLOR-CON) 10 MEQ tablet Take 10 mEq by mouth 2 (two) times daily.    . rosuvastatin (CRESTOR) 40 MG tablet Take 40 mg by mouth daily.     Marland Kitchen gabapentin (NEURONTIN) 300 MG capsule Take 300 mg by mouth 3 (three) times daily.      No current facility-administered medications for this visit.     Past Medical History:  Diagnosis Date  . Carotid artery occlusion   . COPD (chronic obstructive pulmonary disease) ([place]) 07/06/2015  . Headache   . Hypertension   . Hyperthyroidism    ablated with iodine  . Peripheral vascular  disease ([place])    Bilateral Carotid Artery Disease    Past Surgical History:  Procedure Laterality Date  . ABDOMINAL HYSTERECTOMY     heavy bleeding  . CATARACT EXTRACTION W/PHACO Right 10/09/2018   Procedure: CATARACT EXTRACTION PHACO AND INTRAOCULAR LENS PLACEMENT ([place]) RIGHT;  Surgeon: Leandrew Koyanagi, MD;  Location: [facility];  Service: Ophthalmology;  Laterality: Right;  . ENDARTERECTOMY Right 06/14/2018   Procedure: ENDARTERECTOMY CAROTID;  Surgeon: Algernon Huxley, MD;  Location: [facility];  Service: Vascular;  Laterality: Right;  . hallux vagus correction Right   . HALLUX VALGUS AUSTIN Left 07/01/2016   Procedure: HALLUX VALGUS AUSTIN left foot;  Surgeon: Sharlotte Alamo, DPM;  Location: [facility];  Service: Podiatry;  Laterality: Left;  . LOWER EXTREMITY ANGIOGRAPHY Right 11/05/2018   Procedure: LOWER EXTREMITY ANGIOGRAPHY;  Surgeon: Algernon Huxley, MD;  Location: [facility];  Service: Cardiovascular;  Laterality: Right;    Family History  Problem Relation Age of Onset  . Diabetes Mother   . Emphysema Father   . Heart disease Sister   . Heart disease Brother   . Breast cancer Neg Hx   . Ovarian cancer Neg Hx   . Colon cancer Neg Hx      Social History Social History        Tobacco Use  .  Smoking status: Current Every Day Smoker    Packs/day: 0.25    Types: Cigarettes  . Smokeless tobacco: Never Used  Substance Use Topics  . Alcohol use: No  . Drug use: No         Allergies  Allergen Reactions  . Codeine Nausea And Vomiting      REVIEW OF SYSTEMS(Negative unless checked)  Constitutional: [] ??Weight loss[] ??Fever[] ??Chills Cardiac:[] ??Chest pain[] ??Chest pressure[] ??Palpitations [] ??Shortness of breath when laying flat [] ??Shortness of breath at rest [] ??Shortness of breath with exertion. Vascular: [x] ??Pain in legs with walking[x] ??Pain in legsat rest[] ??Pain in legs when laying flat  [x] ??Claudication [] ??Pain in feet when walking [] ??Pain in feet at rest [] ??Pain in feet when laying flat [] ??History of DVT [] ??Phlebitis [] ??Swelling in legs [] ??Varicose veins [] ??Non-healing ulcers Pulmonary: [] ??Uses home oxygen [] ??Productive cough[] ??Hemoptysis [] ??Wheeze [x] ??COPD [] ??Asthma Neurologic: [] ??Dizziness [] ??Blackouts [] ??Seizures [] ??History of stroke [] ??History of TIA[] ??Aphasia [] ??Temporary blindness[] ??Dysphagia [] ??Weaknessor numbness in arms [] ??Weakness or numbnessin legs Musculoskeletal: [x] ??Arthritis [] ??Joint swelling [x] ??Joint pain [] ??Low back pain Hematologic:[] ??Easy bruising[] ??Easy bleeding [] ??Hypercoagulable state [] ??Anemic [] ??Hepatitis Gastrointestinal:[] ??Blood in stool[] ??Vomiting blood[x] ??Gastroesophageal reflux/heartburn[] ??Abdominal pain Genitourinary: [] ??Chronic kidney disease [] ??Difficulturination [] ??Frequenturination [] ??Burning with urination[] ??Hematuria Skin: [] ??Rashes [] ??Ulcers [] ??Wounds Psychological: [] ??History of anxiety[] ??History of major depression.     Physical Examination  BP 126/70 (BP Location: Right Arm, Patient Position: Sitting)   Pulse 86   Resp 12   Ht 5\' 1"  (1.549 m)   Wt 127 lb 3.2 oz (57.7 kg)   BMI 24.03 kg/m  Gen:  WD/WN, NAD Head: [place]/AT, No temporalis wasting. Ear/Nose/Throat: Hearing grossly intact, nares w/o erythema or drainage Eyes: Conjunctiva clear. Sclera non-icteric Neck: Supple.  Trachea midline Pulmonary:  Good air movement, no use of accessory muscles.  Cardiac: RRR, no JVD Vascular:  Vessel Right Left  Radial Palpable Palpable                          PT Palpable Palpable  DP Palpable  1+ palpable   Gastrointestinal: soft, non-tender/non-distended. No guarding/reflex.  Musculoskeletal: M/S 5/5 throughout.  No deformity or atrophy. No edema. Neurologic: Sensation grossly intact in  extremities.  Symmetrical.  Speech is fluent.  Psychiatric: Judgment intact, Mood & affect appropriate for pt's clinical situation. Dermatologic: No rashes or ulcers noted.  No cellulitis or open wounds.       Labs Recent Results (from the past 2160 hour(s))  BUN     Status: None   Collection Time: 11/02/18 11:42 AM  Result Value Ref Range   BUN 13 8 - 23 mg/dL    Comment: Performed at [facility], [address] [place], [place] [address]  Creatinine, serum     Status: None   Collection Time: 11/02/18 11:42 AM  Result Value Ref Range   Creatinine, Ser 0.76 0.44 - 1.00 mg/dL   GFR calc non Af Amer >60 >60 mL/min   GFR calc Af Amer >60 >60 mL/min    Comment: Performed at [facility], [address], [place], [place] [address]    Radiology No results found.  Assessment/Plan Essential hypertension blood pressure control important in reducing the progression of atherosclerotic disease. On appropriate oral medications.   Hyperlipidemia lipid control important in reducing the progression of atherosclerotic disease. Continue statin therapy  Bilateral carotid artery disease ([place]) Doing well after endarterectomy.  To be checked again later this year.  Atherosclerosis of native arteries of extremity with intermittent claudication ([facility])  Her noninvasive studies show improvement with normal ABIs bilaterally of 1.02 on the right and 0.92  on the left with triphasic waveforms.  Symptoms are only mildly improved and there is likely some neuropathic component to her symptoms at this point.  Recheck in about 6 months or sooner if problems develop in the interim.    Leotis Pain, MD  12/04/2018 10:26 AM    This note was created with Dragon medical transcription system.  Any errors from dictation are purely unintentional

## 2018-12-04 NOTE — Patient Instructions (Signed)
Peripheral Vascular Disease  Peripheral vascular disease (PVD) is a disease of the blood vessels that are not part of your heart and brain. A simple term for PVD is poor circulation. In most cases, PVD narrows the blood vessels that carry blood from your heart to the rest of your body. This can reduce the supply of blood to your arms, legs, and internal organs, like your stomach or kidneys. However, PVD most often affects a person's lower legs and feet. Without treatment, PVD tends to get worse. PVD can also lead to acute ischemic limb. This is when an arm or leg suddenly cannot get enough blood. This is a medical emergency. Follow these instructions at home: Lifestyle  Do not use any products that contain nicotine or tobacco, such as cigarettes and e-cigarettes. If you need help quitting, ask your doctor.  Lose weight if you are overweight. Or, stay at a healthy weight as told by your doctor.  Eat a diet that is low in fat and cholesterol. If you need help, ask your doctor.  Exercise regularly. Ask your doctor for activities that are right for you. General instructions  Take over-the-counter and prescription medicines only as told by your doctor.  Take good care of your feet: ? Wear comfortable shoes that fit well. ? Check your feet often for any cuts or sores.  Keep all follow-up visits as told by your doctor This is important. Contact a doctor if:  You have cramps in your legs when you walk.  You have leg pain when you are at rest.  You have coldness in a leg or foot.  Your skin changes.  You are unable to get or have an erection (erectile dysfunction).  You have cuts or sores on your feet that do not heal. Get help right away if:  Your arm or leg turns cold, numb, and blue.  Your arms or legs become red, warm, swollen, painful, or numb.  You have chest pain.  You have trouble breathing.  You suddenly have weakness in your face, arm, or leg.  You become very  confused or you cannot speak.  You suddenly have a very bad headache.  You suddenly cannot see. Summary  Peripheral vascular disease (PVD) is a disease of the blood vessels.  A simple term for PVD is poor circulation. Without treatment, PVD tends to get worse.  Treatment may include exercise, low fat and low cholesterol diet, and quitting smoking. This information is not intended to replace advice given to you by your health care provider. Make sure you discuss any questions you have with your health care provider. Document Released: 01/04/2010 Document Revised: 11/17/2016 Document Reviewed: 11/17/2016 Elsevier Interactive Patient Education  2019 Elsevier Inc.  

## 2018-12-04 NOTE — Assessment & Plan Note (Signed)
Doing well after endarterectomy.  To be checked again later this year.

## 2018-12-04 NOTE — Assessment & Plan Note (Signed)
Her noninvasive studies show improvement with normal ABIs bilaterally of 1.02 on the right and 0.92 on the left with triphasic waveforms.  Symptoms are only mildly improved and there is likely some neuropathic component to her symptoms at this point.  Recheck in about 6 months or sooner if problems develop in the interim.

## 2018-12-05 DIAGNOSIS — H2512 Age-related nuclear cataract, left eye: Secondary | ICD-10-CM | POA: Diagnosis not present

## 2018-12-07 ENCOUNTER — Other Ambulatory Visit (INDEPENDENT_AMBULATORY_CARE_PROVIDER_SITE_OTHER): Payer: Self-pay | Admitting: Nurse Practitioner

## 2018-12-07 DIAGNOSIS — I70213 Atherosclerosis of native arteries of extremities with intermittent claudication, bilateral legs: Secondary | ICD-10-CM

## 2018-12-07 MED ORDER — CLOPIDOGREL BISULFATE 75 MG PO TABS: 75.0000 mg | ORAL_TABLET | Freq: Every day | ORAL | 11 refills | Status: AC

## 2018-12-17 DIAGNOSIS — I10 Essential (primary) hypertension: Secondary | ICD-10-CM | POA: Diagnosis not present

## 2018-12-17 DIAGNOSIS — I739 Peripheral vascular disease, unspecified: Secondary | ICD-10-CM | POA: Diagnosis not present

## 2018-12-24 ENCOUNTER — Other Ambulatory Visit: Payer: Self-pay

## 2018-12-24 ENCOUNTER — Encounter: Payer: Self-pay | Admitting: *Deleted

## 2018-12-24 DIAGNOSIS — I10 Essential (primary) hypertension: Secondary | ICD-10-CM | POA: Diagnosis not present

## 2018-12-24 DIAGNOSIS — I739 Peripheral vascular disease, unspecified: Secondary | ICD-10-CM | POA: Diagnosis not present

## 2018-12-24 DIAGNOSIS — J449 Chronic obstructive pulmonary disease, unspecified: Secondary | ICD-10-CM | POA: Diagnosis not present

## 2018-12-27 NOTE — Discharge Instructions (Signed)

## 2019-01-01 ENCOUNTER — Ambulatory Visit: Payer: PPO | Admitting: Anesthesiology

## 2019-01-01 ENCOUNTER — Ambulatory Visit
Admission: RE | Admit: 2019-01-01 | Discharge: 2019-01-01 | Disposition: A | Discharge status: Home / Self Care | Payer: PPO | Attending: Ophthalmology | Admitting: Ophthalmology

## 2019-01-01 ENCOUNTER — Encounter
Admission: RE | Disposition: A | Discharge status: Home / Self Care | Payer: Self-pay | Source: Home / Self Care | Attending: Ophthalmology

## 2019-01-01 DIAGNOSIS — I739 Peripheral vascular disease, unspecified: Secondary | ICD-10-CM | POA: Diagnosis not present

## 2019-01-01 DIAGNOSIS — H2512 Age-related nuclear cataract, left eye: Secondary | ICD-10-CM | POA: Insufficient documentation

## 2019-01-01 DIAGNOSIS — Z87891 Personal history of nicotine dependence: Secondary | ICD-10-CM | POA: Insufficient documentation

## 2019-01-01 DIAGNOSIS — Z79899 Other long term (current) drug therapy: Secondary | ICD-10-CM | POA: Diagnosis not present

## 2019-01-01 DIAGNOSIS — Z791 Long term (current) use of non-steroidal anti-inflammatories (NSAID): Secondary | ICD-10-CM | POA: Insufficient documentation

## 2019-01-01 DIAGNOSIS — J449 Chronic obstructive pulmonary disease, unspecified: Secondary | ICD-10-CM | POA: Insufficient documentation

## 2019-01-01 DIAGNOSIS — H25812 Combined forms of age-related cataract, left eye: Secondary | ICD-10-CM | POA: Diagnosis not present

## 2019-01-01 DIAGNOSIS — I10 Essential (primary) hypertension: Secondary | ICD-10-CM | POA: Diagnosis not present

## 2019-01-01 HISTORY — PX: CATARACT EXTRACTION W/PHACO: SHX586

## 2019-01-01 SURGERY — PHACOEMULSIFICATION, CATARACT, WITH IOL INSERTION
Anesthesia: Monitor Anesthesia Care | Site: Eye | Laterality: Left

## 2019-01-01 MED ORDER — BRIMONIDINE TARTRATE-TIMOLOL 0.2-0.5 % OP SOLN
OPHTHALMIC | Status: DC | PRN
  Administered 2019-01-01: 1 [drp] via OPHTHALMIC

## 2019-01-01 MED ORDER — ARMC OPHTHALMIC DILATING DROPS
1.0000 "application " | OPHTHALMIC | Status: DC | PRN
  Administered 2019-01-01 (×3): 1 via OPHTHALMIC

## 2019-01-01 MED ORDER — MOXIFLOXACIN HCL 0.5 % OP SOLN
1.0000 [drp] | OPHTHALMIC | Status: DC | PRN
  Administered 2019-01-01 (×3): 1 [drp] via OPHTHALMIC

## 2019-01-01 MED ORDER — LIDOCAINE HCL (PF) 2 % IJ SOLN
INTRAOCULAR | Status: DC | PRN
  Administered 2019-01-01: 2 mL

## 2019-01-01 MED ORDER — NA HYALUR & NA CHOND-NA HYALUR 0.4-0.35 ML IO KIT
PACK | INTRAOCULAR | Status: DC | PRN
  Administered 2019-01-01: 1 mL via INTRAOCULAR

## 2019-01-01 MED ORDER — EPINEPHRINE PF 1 MG/ML IJ SOLN
INTRAOCULAR | Status: DC | PRN
  Administered 2019-01-01: 63 mL via OPHTHALMIC

## 2019-01-01 MED ORDER — LACTATED RINGERS IV SOLN: INTRAVENOUS | Status: DC

## 2019-01-01 MED ORDER — ACETAMINOPHEN 325 MG PO TABS: 325.0000 mg | ORAL_TABLET | ORAL | Status: DC | PRN

## 2019-01-01 MED ORDER — TETRACAINE HCL 0.5 % OP SOLN
1.0000 [drp] | OPHTHALMIC | Status: DC | PRN
  Administered 2019-01-01 (×3): 1 [drp] via OPHTHALMIC

## 2019-01-01 MED ORDER — MIDAZOLAM HCL 2 MG/2ML IJ SOLN
INTRAMUSCULAR | Status: DC | PRN
  Administered 2019-01-01 (×2): 1 mg via INTRAVENOUS

## 2019-01-01 MED ORDER — ACETAMINOPHEN 160 MG/5ML PO SOLN: 325.0000 mg | ORAL | Status: DC | PRN

## 2019-01-01 MED ORDER — CEFUROXIME OPHTHALMIC INJECTION 1 MG/0.1 ML
INJECTION | OPHTHALMIC | Status: DC | PRN
  Administered 2019-01-01: 0.1 mL via INTRACAMERAL

## 2019-01-01 SURGICAL SUPPLY — 20 items
CANNULA ANT/CHMB 27G (MISCELLANEOUS) ×1 IMPLANT
CANNULA ANT/CHMB 27GA (MISCELLANEOUS) ×3 IMPLANT
GLOVE SURG LX 7.5 STRW (GLOVE) ×2
GLOVE SURG LX STRL 7.5 STRW (GLOVE) ×1 IMPLANT
GLOVE SURG TRIUMPH 8.0 PF LTX (GLOVE) ×3 IMPLANT
GOWN STRL REUS W/ TWL LRG LVL3 (GOWN DISPOSABLE) ×2 IMPLANT
GOWN STRL REUS W/TWL LRG LVL3 (GOWN DISPOSABLE) ×4
LENS IOL TECNIS ITEC 23.0 (Intraocular Lens) ×2 IMPLANT
MARKER SKIN DUAL TIP RULER LAB (MISCELLANEOUS) ×3 IMPLANT
NDL FILTER BLUNT 18X1 1/2 (NEEDLE) ×1 IMPLANT
NEEDLE FILTER BLUNT 18X 1/2SAF (NEEDLE) ×2
NEEDLE FILTER BLUNT 18X1 1/2 (NEEDLE) ×1 IMPLANT
PACK CATARACT BRASINGTON (MISCELLANEOUS) ×3 IMPLANT
PACK EYE AFTER SURG (MISCELLANEOUS) ×3 IMPLANT
PACK OPTHALMIC (MISCELLANEOUS) ×3 IMPLANT
SYR 3ML LL SCALE MARK (SYRINGE) ×3 IMPLANT
SYR 5ML LL (SYRINGE) ×3 IMPLANT
SYR TB 1ML LUER SLIP (SYRINGE) ×3 IMPLANT
WATER STERILE IRR 500ML POUR (IV SOLUTION) ×3 IMPLANT
WIPE NON LINTING 3.25X3.25 (MISCELLANEOUS) ×3 IMPLANT

## 2019-01-01 NOTE — Anesthesia Postprocedure Evaluation (Signed)
Anesthesia Post Note  Patient: Jasmine Acosta  Procedure(s) Performed: CATARACT EXTRACTION PHACO AND INTRAOCULAR LENS PLACEMENT ([facility])  LEFT (Left Eye)  Patient location during evaluation: PACU Anesthesia Type: MAC Level of consciousness: awake and alert Pain management: pain level controlled Vital Signs Assessment: post-procedure vital signs reviewed and stable Respiratory status: spontaneous breathing, nonlabored ventilation, respiratory function stable and patient connected to nasal cannula oxygen Cardiovascular status: stable and blood pressure returned to baseline Postop Assessment: no apparent nausea or vomiting Anesthetic complications: no    Alisa Graff

## 2019-01-01 NOTE — H&P (Signed)

## 2019-01-01 NOTE — Op Note (Signed)
OPERATIVE NOTE  Jasmine Acosta [id number] 01/01/2019   PREOPERATIVE DIAGNOSIS:  Nuclear sclerotic cataract left eye. H25.12   POSTOPERATIVE DIAGNOSIS:    Nuclear sclerotic cataract left eye.     PROCEDURE:  Phacoemusification with posterior chamber intraocular lens placement of the left eye   LENS:   Implant Name Type Inv. Item Serial No. Manufacturer Lot No. LRB No. Used  LENS IOL DIOP 23.0 - [id number] Intraocular Lens LENS IOL DIOP 23.0 [id number] AMO  Left 1        ULTRASOUND TIME: 9  % of 0 minutes 49 seconds, CDE 4.7  SURGEON:  Wyonia Hough, MD   ANESTHESIA:  Topical with tetracaine drops and 2% Xylocaine jelly, augmented with 1% preservative-free intracameral lidocaine.    COMPLICATIONS:  None.   DESCRIPTION OF PROCEDURE:  The patient was identified in the holding room and transported to the operating room and placed in the supine position under the operating microscope.  The left eye was identified as the operative eye and it was prepped and draped in the usual sterile ophthalmic fashion.   A 1 millimeter clear-corneal paracentesis was made at the 1:30 position.  0.5 ml of preservative-free 1% lidocaine was injected into the anterior chamber.  The anterior chamber was filled with Viscoat viscoelastic.  A 2.4 millimeter keratome was used to make a near-clear corneal incision at the 10:30 position.  .  A curvilinear capsulorrhexis was made with a cystotome and capsulorrhexis forceps.  Balanced salt solution was used to hydrodissect and hydrodelineate the nucleus.   Phacoemulsification was then used in stop and chop fashion to remove the lens nucleus and epinucleus.  The remaining cortex was then removed using the irrigation and aspiration handpiece. Provisc was then placed into the capsular bag to distend it for lens placement.  A lens was then injected into the capsular bag.  The remaining viscoelastic was aspirated.   Wounds were hydrated with balanced salt solution.   The anterior chamber was inflated to a physiologic pressure with balanced salt solution.  No wound leaks were noted. Cefuroxime 0.1 ml of a 10mg /ml solution was injected into the anterior chamber for a dose of 1 mg of intracameral antibiotic at the completion of the case.   Timolol and Brimonidine drops were applied to the eye.  The patient was taken to the recovery room in stable condition without complications of anesthesia or surgery.  Jasmine Acosta 01/01/2019, 12:15 PM

## 2019-01-01 NOTE — Transfer of Care (Signed)
Immediate Anesthesia Transfer of Care Note  Patient: Jasmine Acosta  Procedure(s) Performed: CATARACT EXTRACTION PHACO AND INTRAOCULAR LENS PLACEMENT ([facility])  LEFT (Left Eye)  Patient Location: PACU  Anesthesia Type: MAC  Level of Consciousness: awake, alert  and patient cooperative  Airway and Oxygen Therapy: Patient Spontanous Breathing and Patient connected to supplemental oxygen  Post-op Assessment: Post-op Vital signs reviewed, Patient's Cardiovascular Status Stable, Respiratory Function Stable, Patent Airway and No signs of Nausea or vomiting  Post-op Vital Signs: Reviewed and stable  Complications: No apparent anesthesia complications

## 2019-01-01 NOTE — Anesthesia Preprocedure Evaluation (Signed)
Anesthesia Evaluation  Patient identified by MRN, date of birth, ID band Patient awake    Reviewed: Allergy & Precautions, H&P , NPO status , Patient's Chart, lab work & pertinent test results, reviewed documented beta blocker date and time   Airway Mallampati: II  TM Distance: >3 FB Neck ROM: full    Dental no notable dental hx.    Pulmonary COPD, former smoker,    Pulmonary exam normal breath sounds clear to auscultation       Cardiovascular Exercise Tolerance: Good hypertension, + Peripheral Vascular Disease (bilateral carotid dz)  negative cardio ROS   Rhythm:regular Rate:Normal     Neuro/Psych  Headaches, negative psych ROS   GI/Hepatic negative GI ROS, Neg liver ROS,   Endo/Other  Hyperthyroidism (s/p ablation)   Renal/GU negative Renal ROS  negative genitourinary   Musculoskeletal   Abdominal   Peds  Hematology negative hematology ROS (+)   Anesthesia Other Findings   Reproductive/Obstetrics negative OB ROS                             Anesthesia Physical Anesthesia Plan  ASA: III  Anesthesia Plan: MAC   Post-op Pain Management:    Induction:   PONV Risk Score and Plan:   Airway Management Planned:   Additional Equipment:   Intra-op Plan:   Post-operative Plan:   Informed Consent: I have reviewed the patients History and Physical, chart, labs and discussed the procedure including the risks, benefits and alternatives for the proposed anesthesia with the patient or authorized representative who has indicated his/her understanding and acceptance.     Dental Advisory Given  Plan Discussed with: CRNA  Anesthesia Plan Comments:         Anesthesia Quick Evaluation

## 2019-01-02 ENCOUNTER — Encounter: Payer: Self-pay | Admitting: Ophthalmology

## 2019-01-28 ENCOUNTER — Other Ambulatory Visit: Payer: Self-pay | Admitting: Internal Medicine

## 2019-01-28 DIAGNOSIS — Z1231 Encounter for screening mammogram for malignant neoplasm of breast: Secondary | ICD-10-CM

## 2019-04-11 ENCOUNTER — Other Ambulatory Visit: Payer: Self-pay

## 2019-04-11 ENCOUNTER — Ambulatory Visit
Admission: RE | Admit: 2019-04-11 | Discharge: 2019-04-11 | Disposition: A | Discharge status: Home / Self Care | Payer: PPO | Source: Ambulatory Visit | Attending: Internal Medicine | Admitting: Internal Medicine

## 2019-04-11 DIAGNOSIS — Z1231 Encounter for screening mammogram for malignant neoplasm of breast: Secondary | ICD-10-CM | POA: Insufficient documentation

## 2019-05-17 ENCOUNTER — Encounter (INDEPENDENT_AMBULATORY_CARE_PROVIDER_SITE_OTHER): Payer: PPO

## 2019-05-17 ENCOUNTER — Ambulatory Visit (INDEPENDENT_AMBULATORY_CARE_PROVIDER_SITE_OTHER): Payer: PPO | Admitting: Vascular Surgery

## 2019-05-22 DIAGNOSIS — M5412 Radiculopathy, cervical region: Secondary | ICD-10-CM | POA: Diagnosis not present

## 2019-05-22 DIAGNOSIS — M7541 Impingement syndrome of right shoulder: Secondary | ICD-10-CM | POA: Diagnosis not present

## 2019-05-31 DIAGNOSIS — M5412 Radiculopathy, cervical region: Secondary | ICD-10-CM | POA: Diagnosis not present

## 2019-05-31 DIAGNOSIS — M7541 Impingement syndrome of right shoulder: Secondary | ICD-10-CM | POA: Diagnosis not present

## 2019-06-06 ENCOUNTER — Other Ambulatory Visit (INDEPENDENT_AMBULATORY_CARE_PROVIDER_SITE_OTHER): Payer: Self-pay | Admitting: Vascular Surgery

## 2019-06-06 DIAGNOSIS — I6523 Occlusion and stenosis of bilateral carotid arteries: Secondary | ICD-10-CM

## 2019-06-07 ENCOUNTER — Other Ambulatory Visit: Payer: Self-pay

## 2019-06-07 ENCOUNTER — Ambulatory Visit (INDEPENDENT_AMBULATORY_CARE_PROVIDER_SITE_OTHER): Payer: PPO

## 2019-06-07 ENCOUNTER — Ambulatory Visit (INDEPENDENT_AMBULATORY_CARE_PROVIDER_SITE_OTHER): Payer: PPO | Admitting: Vascular Surgery

## 2019-06-07 ENCOUNTER — Encounter (INDEPENDENT_AMBULATORY_CARE_PROVIDER_SITE_OTHER): Payer: Self-pay | Admitting: Vascular Surgery

## 2019-06-07 VITALS — BP 162/71 | HR 93 | Resp 16 | Ht 61.0 in | Wt 136.0 lb

## 2019-06-07 DIAGNOSIS — I10 Essential (primary) hypertension: Secondary | ICD-10-CM | POA: Diagnosis not present

## 2019-06-07 DIAGNOSIS — I70213 Atherosclerosis of native arteries of extremities with intermittent claudication, bilateral legs: Secondary | ICD-10-CM

## 2019-06-07 DIAGNOSIS — E785 Hyperlipidemia, unspecified: Secondary | ICD-10-CM | POA: Diagnosis not present

## 2019-06-07 DIAGNOSIS — I6523 Occlusion and stenosis of bilateral carotid arteries: Secondary | ICD-10-CM | POA: Diagnosis not present

## 2019-06-07 NOTE — Assessment & Plan Note (Signed)
Right carotid endarterectomy is widely patent.  Left carotid has 1 to 39% ICA stenosis.  Check this annually.  No change to medical regimen.

## 2019-06-07 NOTE — Assessment & Plan Note (Signed)
Her noninvasive studies today demonstrate normal triphasic waveforms and normal ABIs of 1.0 bilaterally with normal digital pressures bilaterally consistent with no current arterial insufficiency.  Had bilateral iliac and right SFA intervention earlier this year.  Continue current medical regimen.  Recheck in 1 year.

## 2019-06-07 NOTE — Progress Notes (Signed)
MRN : [id number]  Jasmine Acosta is a 68 y.o. (1951/10/22) female who presents with chief complaint of  Chief Complaint  Patient presents with  . Follow-up    ultrasound follow up  .  History of Present Illness: Patient returns in follow-up of multiple vascular issues.  She is about a year status post right carotid endarterectomy.  She is doing well and has no focal neurologic symptoms.  Duplex is done today for evaluation of this. Right carotid endarterectomy is widely patent.  Left carotid has 1 to 39% ICA stenosis. She also had significant PAD that was treated with intervention earlier this year.  She still has claudication symptoms of the lower extremities that may be neuropathic in nature.  Her noninvasive studies today demonstrate normal triphasic waveforms and normal ABIs of 1.0 bilaterally with normal digital pressures bilaterally consistent with no current arterial insufficiency.  Current Outpatient Medications  Medication Sig Dispense Refill  . amLODipine (NORVASC) 5 MG tablet Take 5 mg by mouth daily.   11  . aspirin EC 81 MG tablet Take 1 tablet (81 mg total) by mouth daily. 150 tablet 2  . clopidogrel (PLAVIX) 75 MG tablet Take 1 tablet (75 mg total) by mouth daily with breakfast. 30 tablet 11  . gabapentin (NEURONTIN) 300 MG capsule Take 300 mg by mouth 3 (three) times daily.     . meloxicam (MOBIC) 15 MG tablet Take 15 mg by mouth daily as needed for pain.    . potassium chloride (K-DUR,KLOR-CON) 10 MEQ tablet Take 10 mEq by mouth 2 (two) times daily.    . rosuvastatin (CRESTOR) 40 MG tablet Take 40 mg by mouth daily.     Vladimir Faster Glycol-Propyl Glycol (SYSTANE OP) Apply to eye 3 (three) times daily as needed.     No current facility-administered medications for this visit.     Past Medical History:  Diagnosis Date  . Carotid artery occlusion   . COPD (chronic obstructive pulmonary disease) ([place]) 07/06/2015  . Headache   . Hypertension   . Hyperthyroidism    ablated  with iodine  . Peripheral vascular disease ([place])    Bilateral Carotid Artery Disease    Past Surgical History:  Procedure Laterality Date  . ABDOMINAL HYSTERECTOMY     heavy bleeding  . CATARACT EXTRACTION W/PHACO Right 10/09/2018   Procedure: CATARACT EXTRACTION PHACO AND INTRAOCULAR LENS PLACEMENT ([place]) RIGHT;  Surgeon: Leandrew Koyanagi, MD;  Location: [facility];  Service: Ophthalmology;  Laterality: Right;  . CATARACT EXTRACTION W/PHACO Left 01/01/2019   Procedure: CATARACT EXTRACTION PHACO AND INTRAOCULAR LENS PLACEMENT ([place])  LEFT;  Surgeon: Leandrew Koyanagi, MD;  Location: [facility];  Service: Ophthalmology;  Laterality: Left;  . ENDARTERECTOMY Right 06/14/2018   Procedure: ENDARTERECTOMY CAROTID;  Surgeon: Algernon Huxley, MD;  Location: [facility];  Service: Vascular;  Laterality: Right;  . hallux vagus correction Right   . HALLUX VALGUS AUSTIN Left 07/01/2016   Procedure: HALLUX VALGUS AUSTIN left foot;  Surgeon: Sharlotte Alamo, DPM;  Location: [facility];  Service: Podiatry;  Laterality: Left;  . LOWER EXTREMITY ANGIOGRAPHY Right 11/05/2018   Procedure: LOWER EXTREMITY ANGIOGRAPHY;  Surgeon: Algernon Huxley, MD;  Location: [facility];  Service: Cardiovascular;  Laterality: Right;    Social History Social History   Tobacco Use  . Smoking status: Former Smoker    Packs/day: 0.25    Years: 48.00    Pack years: 12.00    Types: Cigarettes    Quit  date: 06/14/2018    Years since quitting: 0.9  . Smokeless tobacco: Never Used  . Tobacco comment: since age 89  Substance Use Topics  . Alcohol use: No  . Drug use: No    Family History Family History  Problem Relation Age of Onset  . Diabetes Mother   . Emphysema Father   . Heart disease Sister   . Heart disease Brother   . Breast cancer Neg Hx   . Ovarian cancer Neg Hx   . Colon cancer Neg Hx     Allergies  Allergen Reactions  . Lisinopril Other (See Comments)    Other reaction(s): Angioedema  Of tongue only  . Codeine Nausea And Vomiting   REVIEW OF SYSTEMS(Negative unless checked)  Constitutional: [] ???Weight loss[] ???Fever[] ???Chills Cardiac:[] ???Chest pain[] ???Chest pressure[] ???Palpitations [] ???Shortness of breath when laying flat [] ???Shortness of breath at rest [] ???Shortness of breath with exertion. Vascular: [x] ???Pain in legs with walking[x] ???Pain in legsat rest[] ???Pain in legs when laying flat [x] ???Claudication [] ???Pain in feet when walking [] ???Pain in feet at rest [] ???Pain in feet when laying flat [] ???History of DVT [] ???Phlebitis [] ???Swelling in legs [] ???Varicose veins [] ???Non-healing ulcers Pulmonary: [] ???Uses home oxygen [] ???Productive cough[] ???Hemoptysis [] ???Wheeze [x] ???COPD [] ???Asthma Neurologic: [] ???Dizziness [] ???Blackouts [] ???Seizures [] ???History of stroke [] ???History of TIA[] ???Aphasia [] ???Temporary blindness[] ???Dysphagia [] ???Weaknessor numbness in arms [] ???Weakness or numbnessin legs Musculoskeletal: [x] ???Arthritis [] ???Joint swelling [x] ???Joint pain [] ???Low back pain Hematologic:[] ???Easy bruising[] ???Easy bleeding [] ???Hypercoagulable state [] ???Anemic [] ???Hepatitis Gastrointestinal:[] ???Blood in stool[] ???Vomiting blood[x] ???Gastroesophageal reflux/heartburn[] ???Abdominal pain Genitourinary: [] ???Chronic kidney disease [] ???Difficulturination [] ???Frequenturination [] ???Burning with urination[] ???Hematuria Skin: [] ???Rashes [] ???Ulcers [] ???Wounds Psychological: [] ???History of anxiety[] ???History of major depression.    Physical Examination  Vitals:   06/07/19 1054  BP: (!) 162/71  Pulse: 93  Resp: 16  Weight: 136 lb (61.7 kg)  Height: 5\' 1"  (1.549 m)   Body mass index is 25.7 kg/m. Gen:  WD/WN, NAD Head: [place]/AT, No temporalis wasting. Ear/Nose/Throat: Hearing grossly intact, nares w/o erythema or  drainage, trachea midline Eyes: Conjunctiva clear. Sclera non-icteric Neck: Supple.  No bruit  Pulmonary:  Good air movement, equal and clear to auscultation bilaterally.  Cardiac: RRR, No JVD Vascular:  Vessel Right Left  Radial Palpable Palpable                          PT Palpable Palpable  DP Palpable Palpable    Musculoskeletal: M/S 5/5 throughout.  No deformity or atrophy.  Trace lower extremity edema. Neurologic: CN 2-12 intact. Sensation grossly intact in extremities.  Symmetrical.  Speech is fluent. Motor exam as listed above. Psychiatric: Judgment intact, Mood & affect appropriate for pt's clinical situation. Dermatologic: No rashes or ulcers noted.  No cellulitis or open wounds.      CBC Lab Results  Component Value Date   WBC 13.0 (H) 06/15/2018   HGB 10.0 (L) 06/15/2018   HCT 29.6 (L) 06/15/2018   MCV 96.1 06/15/2018   PLT 165 06/15/2018    BMET    Component Value Date/Time   NA 140 06/15/2018 0456   K 3.9 06/15/2018 0456   CL 110 06/15/2018 0456   CO2 24 06/15/2018 0456   GLUCOSE 108 (H) 06/15/2018 0456   BUN 13 11/02/2018 1142   CREATININE 0.76 11/02/2018 1142   CALCIUM 8.6 (L) 06/15/2018 0456   GFRNONAA >60 11/02/2018 1142   GFRAA >60 11/02/2018 1142   CrCl cannot be calculated (Patient's most recent lab result is older than the maximum 21 days allowed.).  COAG Lab Results  Component Value Date   INR 1.03 06/07/2018  Radiology No results found.   Assessment/Plan Essential hypertension blood pressure control important in reducing the progression of atherosclerotic disease. On appropriate oral medications.   Hyperlipidemia lipid control important in reducing the progression of atherosclerotic disease. Continue statin therapy  Bilateral carotid artery disease (HCC) Right carotid endarterectomy is widely patent.  Left carotid has 1 to 39% ICA stenosis.  Check this annually.  No change to medical regimen.  Atherosclerosis of  native arteries of extremity with intermittent claudication (HCC) Her noninvasive studies today demonstrate normal triphasic waveforms and normal ABIs of 1.0 bilaterally with normal digital pressures bilaterally consistent with no current arterial insufficiency.  Had bilateral iliac and right SFA intervention earlier this year.  Continue current medical regimen.  Recheck in 1 year.    Leotis Pain, MD  06/07/2019 12:02 PM    This note was created with Dragon medical transcription system.  Any errors from dictation are purely unintentional

## 2019-06-11 ENCOUNTER — Emergency Department: Payer: PPO

## 2019-06-11 ENCOUNTER — Emergency Department
Admission: EM | Admit: 2019-06-11 | Discharge: 2019-06-11 | Disposition: A | Discharge status: Home / Self Care | Payer: PPO | Attending: Student in an Organized Health Care Education/Training Program | Admitting: Student in an Organized Health Care Education/Training Program

## 2019-06-11 ENCOUNTER — Other Ambulatory Visit: Payer: Self-pay

## 2019-06-11 DIAGNOSIS — R079 Chest pain, unspecified: Secondary | ICD-10-CM

## 2019-06-11 DIAGNOSIS — Z7902 Long term (current) use of antithrombotics/antiplatelets: Secondary | ICD-10-CM | POA: Diagnosis not present

## 2019-06-11 DIAGNOSIS — R072 Precordial pain: Secondary | ICD-10-CM | POA: Diagnosis not present

## 2019-06-11 DIAGNOSIS — I700 Atherosclerosis of aorta: Secondary | ICD-10-CM | POA: Diagnosis not present

## 2019-06-11 DIAGNOSIS — Z20828 Contact with and (suspected) exposure to other viral communicable diseases: Secondary | ICD-10-CM | POA: Diagnosis not present

## 2019-06-11 DIAGNOSIS — R0789 Other chest pain: Secondary | ICD-10-CM | POA: Insufficient documentation

## 2019-06-11 DIAGNOSIS — I10 Essential (primary) hypertension: Secondary | ICD-10-CM | POA: Diagnosis not present

## 2019-06-11 DIAGNOSIS — R0602 Shortness of breath: Secondary | ICD-10-CM | POA: Insufficient documentation

## 2019-06-11 DIAGNOSIS — Z7982 Long term (current) use of aspirin: Secondary | ICD-10-CM | POA: Diagnosis not present

## 2019-06-11 DIAGNOSIS — Z87891 Personal history of nicotine dependence: Secondary | ICD-10-CM | POA: Insufficient documentation

## 2019-06-11 DIAGNOSIS — Z79899 Other long term (current) drug therapy: Secondary | ICD-10-CM | POA: Diagnosis not present

## 2019-06-11 DIAGNOSIS — R000 Tachycardia, unspecified: Secondary | ICD-10-CM | POA: Diagnosis not present

## 2019-06-11 DIAGNOSIS — I774 Celiac artery compression syndrome: Secondary | ICD-10-CM | POA: Diagnosis not present

## 2019-06-11 DIAGNOSIS — J449 Chronic obstructive pulmonary disease, unspecified: Secondary | ICD-10-CM | POA: Diagnosis not present

## 2019-06-11 LAB — HEPATIC FUNCTION PANEL
ALT: 17 U/L (ref 0–44)
AST: 18 U/L (ref 15–41)
Albumin: 4.2 g/dL (ref 3.5–5.0)
Alkaline Phosphatase: 67 U/L (ref 38–126)
Bilirubin, Direct: <0.1 mg/dL (ref 0.0–0.2)
Total Bilirubin: 0.6 mg/dL (ref 0.3–1.2)
Total Protein: 7.7 g/dL (ref 6.5–8.1)

## 2019-06-11 LAB — BASIC METABOLIC PANEL
Anion gap: 9 (ref 5–15)
BUN: 9 mg/dL (ref 8–23)
CO2: 23 mmol/L (ref 22–32)
Calcium: 9.2 mg/dL (ref 8.9–10.3)
Chloride: 105 mmol/L (ref 98–111)
Creatinine, Ser: 0.65 mg/dL (ref 0.44–1.00)
GFR calc Af Amer: >60 mL/min (ref >60)
GFR calc non Af Amer: >60 mL/min (ref >60)
Glucose, Bld: 110 mg/dL — ABNORMAL HIGH (ref 70–99)
Potassium: 3.2 mmol/L — ABNORMAL LOW (ref 3.5–5.1)
Sodium: 137 mmol/L (ref 135–145)

## 2019-06-11 LAB — CBC
HCT: 32.9 % — ABNORMAL LOW (ref 36.0–46.0)
Hemoglobin: 9.3 g/dL — ABNORMAL LOW (ref 12.0–15.0)
MCH: 21.4 pg — ABNORMAL LOW (ref 26.0–34.0)
MCHC: 28.3 g/dL — ABNORMAL LOW (ref 30.0–36.0)
MCV: 75.8 fL — ABNORMAL LOW (ref 80.0–100.0)
Platelets: 200 10*3/uL (ref 150–400)
RBC: 4.34 MIL/uL (ref 3.87–5.11)
RDW: 18.3 % — ABNORMAL HIGH (ref 11.5–15.5)
WBC: 8.3 10*3/uL (ref 4.0–10.5)
nRBC: 0 % (ref 0.0–0.2)

## 2019-06-11 LAB — LIPASE, BLOOD: Lipase: 29 U/L (ref 11–51)

## 2019-06-11 LAB — TROPONIN I (HIGH SENSITIVITY)
Troponin I (High Sensitivity): 7 ng/L (ref <18)
Troponin I (High Sensitivity): 8 ng/L (ref <18)

## 2019-06-11 LAB — SARS CORONAVIRUS 2 BY RT PCR (HOSPITAL ORDER, PERFORMED IN ~~LOC~~ HOSPITAL LAB): SARS Coronavirus 2: NEGATIVE

## 2019-06-11 MED ORDER — AMLODIPINE BESYLATE 5 MG PO TABS
5.0000 mg | ORAL_TABLET | Freq: Once | ORAL | Status: AC
  Administered 2019-06-11: 5 mg via ORAL
  Filled 2019-06-11: qty 1

## 2019-06-11 MED ORDER — ALBUTEROL SULFATE HFA 108 (90 BASE) MCG/ACT IN AERS: 2.0000 | INHALATION_SPRAY | Freq: Four times a day (QID) | RESPIRATORY_TRACT | 0 refills | Status: DC | PRN

## 2019-06-11 MED ORDER — LABETALOL HCL 5 MG/ML IV SOLN
5.0000 mg | Freq: Once | INTRAVENOUS | Status: AC
  Administered 2019-06-11: 5 mg via INTRAVENOUS
  Filled 2019-06-11 (×2): qty 4

## 2019-06-11 MED ORDER — SODIUM CHLORIDE 0.9% FLUSH
3.0000 mL | Freq: Once | INTRAVENOUS | Status: AC
  Administered 2019-06-11: 3 mL via INTRAVENOUS

## 2019-06-11 MED ORDER — IOHEXOL 350 MG/ML SOLN
100.0000 mL | Freq: Once | INTRAVENOUS | Status: AC | PRN
  Administered 2019-06-11: 100 mL via INTRAVENOUS
  Filled 2019-06-11: qty 100

## 2019-06-11 MED ORDER — IPRATROPIUM-ALBUTEROL 0.5-2.5 (3) MG/3ML IN SOLN
3.0000 mL | Freq: Once | RESPIRATORY_TRACT | Status: AC
  Administered 2019-06-11: 3 mL via RESPIRATORY_TRACT
  Filled 2019-06-11: qty 3

## 2019-06-11 NOTE — ED Triage Notes (Signed)
Pt comes into the ED via EMS from home with c/o having substernal chest pressure for the past 2 days, EMS reports giving the 324mg  ASA in route and the pt now denies having any pain.

## 2019-06-11 NOTE — ED Notes (Signed)
ED Provider at bedside. 

## 2019-06-11 NOTE — ED Notes (Signed)
Lab called to inquire about result of COVID swab, still running.

## 2019-06-11 NOTE — ED Notes (Signed)
Pt alert and oriented X 4, stable for discharge. RR even and unlabored, color WNL. Discussed discharge instructions and follow up when appropriate. Instructed to follow up with ER for any life threatening symptoms or concerns that patient or family of patient may have  

## 2019-06-11 NOTE — ED Provider Notes (Signed)
[facility] Emergency Department Provider Note    First MD Initiated Contact with Patient 06/11/19 1258     (approximate)  I have reviewed the triage vital signs and the nursing notes.   HISTORY  Chief Complaint Chest Pain    HPI Jasmine Acosta is a 68 y.o. female close past medical history is status post stent and PCI for PAD and carotid stenosis presents the ER with 2 days of right-sided chest pain radiating through to her back.  Was not diaphoretic with this but states that the pain kept her awake all night 2 nights ago.  Did have a cough 2 nights ago.  States the pain does radiate through to her back.  Denies any numbness or tingling in her legs or arms.  Was evaluate by EMS and was given aspirin as well as nitro.  She is already on aspirin and Plavix for stents.  Does have a remote history of smoking.  Eyes any nausea or vomiting.  No epigastric pain.  No history of reflux.    Past Medical History:  Diagnosis Date  . Carotid artery occlusion   . COPD (chronic obstructive pulmonary disease) ([place]) 07/06/2015  . Headache   . Hypertension   . Hyperthyroidism    ablated with iodine  . Peripheral vascular disease (HCC)    Bilateral Carotid Artery Disease   Family History  Problem Relation Age of Onset  . Diabetes Mother   . Emphysema Father   . Heart disease Sister   . Heart disease Brother   . Breast cancer Neg Hx   . Ovarian cancer Neg Hx   . Colon cancer Neg Hx    Past Surgical History:  Procedure Laterality Date  . ABDOMINAL HYSTERECTOMY     heavy bleeding  . CATARACT EXTRACTION W/PHACO Right 10/09/2018   Procedure: CATARACT EXTRACTION PHACO AND INTRAOCULAR LENS PLACEMENT ([place]) RIGHT;  Surgeon: Leandrew Koyanagi, MD;  Location: [place];  Service: Ophthalmology;  Laterality: Right;  . CATARACT EXTRACTION W/PHACO Left 01/01/2019   Procedure: CATARACT EXTRACTION PHACO AND INTRAOCULAR LENS PLACEMENT ([place])  LEFT;  Surgeon:  Leandrew Koyanagi, MD;  Location: [place];  Service: Ophthalmology;  Laterality: Left;  . ENDARTERECTOMY Right 06/14/2018   Procedure: ENDARTERECTOMY CAROTID;  Surgeon: Algernon Huxley, MD;  Location: [facility];  Service: Vascular;  Laterality: Right;  . hallux vagus correction Right   . HALLUX VALGUS AUSTIN Left 07/01/2016   Procedure: HALLUX VALGUS AUSTIN left foot;  Surgeon: Sharlotte Alamo, DPM;  Location: [facility];  Service: Podiatry;  Laterality: Left;  . LOWER EXTREMITY ANGIOGRAPHY Right 11/05/2018   Procedure: LOWER EXTREMITY ANGIOGRAPHY;  Surgeon: Algernon Huxley, MD;  Location: [place];  Service: Cardiovascular;  Laterality: Right;   Patient Active Problem List   Diagnosis Date Noted  . Carotid stenosis, asymptomatic, right 06/14/2018  . Hyperlipidemia 04/06/2018  . Atherosclerosis of native arteries of extremity with intermittent claudication ([place]) 04/06/2018  . Pseudoclaudication 09/05/2017  . Menopause 06/28/2017  . Status post hysterectomy 06/28/2017  . Tobacco user 06/28/2017  . Hip pain 03/04/2016  . Impingement syndrome of shoulder region 08/13/2015  . COPD (chronic obstructive pulmonary disease) ([place]) 07/06/2015  . Health care maintenance 07/06/2015  . Bilateral carotid artery disease ([place]) 11/24/2014  . Chronic migraine without aura without status migrainosus, not intractable 11/24/2014  . Essential hypertension 11/24/2014      Prior to Admission medications   Medication Sig Start Date End Date Taking? Authorizing Provider  albuterol (VENTOLIN HFA) 108 (90 Base) MCG/ACT inhaler Inhale 2 puffs into the lungs every 6 (six) hours as needed for wheezing or shortness of breath. 06/11/19   Merlyn Lot, MD  amLODipine (NORVASC) 5 MG tablet Take 5 mg by mouth daily.  01/24/18   [provider]  aspirin EC 81 MG tablet Take 1 tablet (81 mg total) by mouth daily. 11/05/18   Algernon Huxley, MD  clopidogrel (PLAVIX) 75 MG tablet Take 1 tablet (75 mg  total) by mouth daily with breakfast. 12/07/18   Kris Hartmann, NP  gabapentin (NEURONTIN) 300 MG capsule Take 300 mg by mouth 3 (three) times daily.  11/07/17 06/07/19  [provider]  meloxicam (MOBIC) 15 MG tablet Take 15 mg by mouth daily as needed for pain.    [provider]  Polyethyl Glycol-Propyl Glycol (SYSTANE OP) Apply to eye 3 (three) times daily as needed.    [provider]  potassium chloride (K-DUR,KLOR-CON) 10 MEQ tablet Take 10 mEq by mouth 2 (two) times daily.    [provider]  rosuvastatin (CRESTOR) 40 MG tablet Take 40 mg by mouth daily.  03/08/18 06/07/19  [provider]    Allergies Lisinopril and Codeine    Social History Social History   Tobacco Use  . Smoking status: Former Smoker    Packs/day: 0.25    Years: 48.00    Pack years: 12.00    Types: Cigarettes    Quit date: 06/14/2018    Years since quitting: 0.9  . Smokeless tobacco: Never Used  . Tobacco comment: since age 35  Substance Use Topics  . Alcohol use: No  . Drug use: No    Review of Systems Patient denies headaches, rhinorrhea, blurry vision, numbness, shortness of breath, chest pain, edema, cough, abdominal pain, nausea, vomiting, diarrhea, dysuria, fevers, rashes or hallucinations unless otherwise stated above in HPI. ____________________________________________   PHYSICAL EXAM:  VITAL SIGNS: Vitals:   06/11/19 1259 06/11/19 1500  BP: (!) 144/76 134/71  Pulse:  72  Resp:  16  Temp:    SpO2:  98%    Constitutional: Alert and oriented.  Eyes: Conjunctivae are normal.  Head: Atraumatic. Nose: No congestion/rhinnorhea. Mouth/Throat: Mucous membranes are moist.   Neck: No stridor. Painless ROM.  Cardiovascular: Normal rate, regular rhythm. Grossly normal heart sounds.  Good peripheral circulation. Respiratory: Normal respiratory effort.  No retractions. Lungs CTAB. Gastrointestinal: Soft and nontender. No distention. No abdominal  bruits. No CVA tenderness. Genitourinary:  Musculoskeletal: No lower extremity tenderness nor edema.  No joint effusions. Neurologic:  Normal speech and language. No gross focal neurologic deficits are appreciated. No facial droop Skin:  Skin is warm, dry and intact. No rash noted. Psychiatric: Mood and affect are normal. Speech and behavior are normal.  ____________________________________________   LABS (all labs ordered are listed, but only abnormal results are displayed)  Results for orders placed or performed during the hospital encounter of 06/11/19 (from the past 24 hour(s))  Basic metabolic panel     Status: Abnormal   Collection Time: 06/11/19 12:15 PM  Result Value Ref Range   Sodium 137 135 - 145 mmol/L   Potassium 3.2 (L) 3.5 - 5.1 mmol/L   Chloride 105 98 - 111 mmol/L   CO2 23 22 - 32 mmol/L   Glucose, Bld 110 (H) 70 - 99 mg/dL   BUN 9 8 - 23 mg/dL   Creatinine, Ser 0.65 0.44 - 1.00 mg/dL   Calcium 9.2 8.9 -  10.3 mg/dL   GFR calc non Af Amer >60 >60 mL/min   GFR calc Af Amer >60 >60 mL/min   Anion gap 9 5 - 15  CBC     Status: Abnormal   Collection Time: 06/11/19 12:15 PM  Result Value Ref Range   WBC 8.3 4.0 - 10.5 K/uL   RBC 4.34 3.87 - 5.11 MIL/uL   Hemoglobin 9.3 (L) 12.0 - 15.0 g/dL   HCT 32.9 (L) 36.0 - 46.0 %   MCV 75.8 (L) 80.0 - 100.0 fL   MCH 21.4 (L) 26.0 - 34.0 pg   MCHC 28.3 (L) 30.0 - 36.0 g/dL   RDW 18.3 (H) 11.5 - 15.5 %   Platelets 200 150 - 400 K/uL   nRBC 0.0 0.0 - 0.2 %  Troponin I (High Sensitivity)     Status: None   Collection Time: 06/11/19 12:15 PM  Result Value Ref Range   Troponin I (High Sensitivity) 7 <18 ng/L  Hepatic function panel     Status: None   Collection Time: 06/11/19 12:15 PM  Result Value Ref Range   Total Protein 7.7 6.5 - 8.1 g/dL   Albumin 4.2 3.5 - 5.0 g/dL   AST 18 15 - 41 U/L   ALT 17 0 - 44 U/L   Alkaline Phosphatase 67 38 - 126 U/L   Total Bilirubin 0.6 0.3 - 1.2 mg/dL   Bilirubin, Direct <0.1 0.0 - 0.2  mg/dL   Indirect Bilirubin NOT CALCULATED 0.3 - 0.9 mg/dL  Lipase, blood     Status: None   Collection Time: 06/11/19 12:15 PM  Result Value Ref Range   Lipase 29 11 - 51 U/L  Troponin I (High Sensitivity)     Status: None   Collection Time: 06/11/19  2:47 PM  Result Value Ref Range   Troponin I (High Sensitivity) 8 <18 ng/L   ____________________________________________  EKG My review and personal interpretation at Time: 12:10   Indication: chest pain  Rate: 100  Rhythm: sinus Axis: normal Other: normal intervals, no stemi ____________________________________________  RADIOLOGY  I personally reviewed all radiographic images ordered to evaluate for the above acute complaints and reviewed radiology reports and findings.  These findings were personally discussed with the patient.  Please see medical record for radiology report.  ____________________________________________   PROCEDURES  Procedure(s) performed:  Procedures    Critical Care performed: no ____________________________________________   INITIAL IMPRESSION / ASSESSMENT AND PLAN / ED COURSE  Pertinent labs & imaging results that were available during my care of the patient were reviewed by me and considered in my medical decision making (see chart for details).   DDX: ACS, pericarditis, esophagitis, boerhaaves, pe, dissection, pna, bronchitis, costochondritis    Gracelin Dabney is a 68 y.o. who presents to the ED with chest pain as described above.  Patient does have risk factors for ACS with initial heart score of 6 but the symptoms have been ongoing for over 2 days with initial troponin being negative.  Will order repeat enzyme.  Based on her PAD with pain shooting through to her back will order CTA to exclude dissection.  Have a lower suspicion for PE clinically given lack of lower extremity swelling at her symptoms being more pain related with no shortness of breath or pleuritic discomfort.  The patient will be  placed on continuous pulse oximetry and telemetry for monitoring.  Laboratory evaluation will be sent to evaluate for the above complaints.     Clinical Course as  of Jun 10 1624  Tue Jun 11, 2019  1538 Patient remains pain-free.  States that she did not take her Norvasc today which would explain her hypertension.  Her enzymes are negative.   [PR]  1614 Discussed with patient's cardiologist Dr. Clayborn Bigness who kindly agrees to see patient and close outpatient follow-up.  She remains pain-free ambulating to the bathroom in no acute distress.  At this point believe she stable and appropriate for outpatient follow-up.   [PR]    Clinical Course User Index [PR] Merlyn Lot, MD    The patient was evaluated in Emergency Department today for the symptoms described in the history of present illness. He/she was evaluated in the context of the global COVID-19 pandemic, which necessitated consideration that the patient might be at risk for infection with the SARS-CoV-2 virus that causes COVID-19. Institutional protocols and algorithms that pertain to the evaluation of patients at risk for COVID-19 are in a state of rapid change based on information released by regulatory bodies including the CDC and federal and state organizations. These policies and algorithms were followed during the patient's care in the ED.  As part of my medical decision making, I reviewed the following data within the [facility] notes reviewed and incorporated, Labs reviewed, notes from prior ED visits and [place] Controlled Substance Database   ____________________________________________   FINAL CLINICAL IMPRESSION(S) / ED DIAGNOSES  Final diagnoses:  Chest pain, unspecified type      NEW MEDICATIONS STARTED DURING THIS VISIT:  New Prescriptions   ALBUTEROL (VENTOLIN HFA) 108 (90 BASE) MCG/ACT INHALER    Inhale 2 puffs into the lungs every 6 (six) hours as needed for wheezing or shortness of breath.      Note:  This document was prepared using Dragon voice recognition software and may include unintentional dictation errors.    Merlyn Lot, MD 06/11/19 [phone]

## 2019-06-13 DIAGNOSIS — I779 Disorder of arteries and arterioles, unspecified: Secondary | ICD-10-CM | POA: Diagnosis not present

## 2019-06-13 DIAGNOSIS — I10 Essential (primary) hypertension: Secondary | ICD-10-CM | POA: Diagnosis not present

## 2019-06-13 DIAGNOSIS — Z87891 Personal history of nicotine dependence: Secondary | ICD-10-CM | POA: Diagnosis not present

## 2019-06-13 DIAGNOSIS — R0602 Shortness of breath: Secondary | ICD-10-CM | POA: Diagnosis not present

## 2019-06-13 DIAGNOSIS — I739 Peripheral vascular disease, unspecified: Secondary | ICD-10-CM | POA: Diagnosis not present

## 2019-06-13 DIAGNOSIS — I208 Other forms of angina pectoris: Secondary | ICD-10-CM | POA: Diagnosis not present

## 2019-06-13 DIAGNOSIS — I70219 Atherosclerosis of native arteries of extremities with intermittent claudication, unspecified extremity: Secondary | ICD-10-CM | POA: Diagnosis not present

## 2019-06-13 DIAGNOSIS — J449 Chronic obstructive pulmonary disease, unspecified: Secondary | ICD-10-CM | POA: Diagnosis not present

## 2019-06-13 DIAGNOSIS — R011 Cardiac murmur, unspecified: Secondary | ICD-10-CM | POA: Diagnosis not present

## 2019-06-13 DIAGNOSIS — G43709 Chronic migraine without aura, not intractable, without status migrainosus: Secondary | ICD-10-CM | POA: Diagnosis not present

## 2019-06-25 DIAGNOSIS — I208 Other forms of angina pectoris: Secondary | ICD-10-CM | POA: Diagnosis not present

## 2019-06-25 DIAGNOSIS — R0602 Shortness of breath: Secondary | ICD-10-CM | POA: Diagnosis not present

## 2019-06-26 DIAGNOSIS — I739 Peripheral vascular disease, unspecified: Secondary | ICD-10-CM | POA: Diagnosis not present

## 2019-06-26 DIAGNOSIS — I10 Essential (primary) hypertension: Secondary | ICD-10-CM | POA: Diagnosis not present

## 2019-06-26 DIAGNOSIS — Z0000 Encounter for general adult medical examination without abnormal findings: Secondary | ICD-10-CM | POA: Diagnosis not present

## 2019-06-26 DIAGNOSIS — I779 Disorder of arteries and arterioles, unspecified: Secondary | ICD-10-CM | POA: Diagnosis not present

## 2019-06-26 DIAGNOSIS — I70219 Atherosclerosis of native arteries of extremities with intermittent claudication, unspecified extremity: Secondary | ICD-10-CM | POA: Diagnosis not present

## 2019-06-26 DIAGNOSIS — J449 Chronic obstructive pulmonary disease, unspecified: Secondary | ICD-10-CM | POA: Diagnosis not present

## 2019-06-27 DIAGNOSIS — D509 Iron deficiency anemia, unspecified: Secondary | ICD-10-CM | POA: Diagnosis not present

## 2019-06-27 DIAGNOSIS — Z86010 Personal history of colonic polyps: Secondary | ICD-10-CM | POA: Insufficient documentation

## 2019-06-27 DIAGNOSIS — R0789 Other chest pain: Secondary | ICD-10-CM | POA: Diagnosis not present

## 2019-06-27 DIAGNOSIS — R710 Precipitous drop in hematocrit: Secondary | ICD-10-CM | POA: Diagnosis not present

## 2019-06-27 DIAGNOSIS — Z8774 Personal history of (corrected) congenital malformations of heart and circulatory system: Secondary | ICD-10-CM | POA: Insufficient documentation

## 2019-06-27 DIAGNOSIS — R142 Eructation: Secondary | ICD-10-CM | POA: Diagnosis not present

## 2019-06-27 DIAGNOSIS — R195 Other fecal abnormalities: Secondary | ICD-10-CM | POA: Diagnosis not present

## 2019-06-28 ENCOUNTER — Inpatient Hospital Stay: Payer: PPO

## 2019-06-28 ENCOUNTER — Encounter: Payer: Self-pay | Admitting: Oncology

## 2019-06-28 ENCOUNTER — Inpatient Hospital Stay: Payer: PPO | Attending: Oncology | Admitting: Oncology

## 2019-06-28 ENCOUNTER — Other Ambulatory Visit: Payer: Self-pay

## 2019-06-28 VITALS — BP 136/66 | HR 83 | Resp 18

## 2019-06-28 DIAGNOSIS — D509 Iron deficiency anemia, unspecified: Secondary | ICD-10-CM | POA: Diagnosis not present

## 2019-06-28 DIAGNOSIS — I10 Essential (primary) hypertension: Secondary | ICD-10-CM | POA: Insufficient documentation

## 2019-06-28 DIAGNOSIS — I739 Peripheral vascular disease, unspecified: Secondary | ICD-10-CM | POA: Diagnosis not present

## 2019-06-28 DIAGNOSIS — R531 Weakness: Secondary | ICD-10-CM | POA: Insufficient documentation

## 2019-06-28 DIAGNOSIS — E0590 Thyrotoxicosis, unspecified without thyrotoxic crisis or storm: Secondary | ICD-10-CM | POA: Insufficient documentation

## 2019-06-28 DIAGNOSIS — R5383 Other fatigue: Secondary | ICD-10-CM | POA: Diagnosis not present

## 2019-06-28 DIAGNOSIS — J449 Chronic obstructive pulmonary disease, unspecified: Secondary | ICD-10-CM | POA: Diagnosis not present

## 2019-06-28 MED ORDER — SODIUM CHLORIDE 0.9 % IV SOLN
510.0000 mg | Freq: Once | INTRAVENOUS | Status: AC
  Administered 2019-06-28: 510 mg via INTRAVENOUS
  Filled 2019-06-28: qty 17

## 2019-06-28 MED ORDER — SODIUM CHLORIDE 0.9 % IV SOLN
Freq: Once | INTRAVENOUS | Status: AC
  Administered 2019-06-28: 15:00:00 via INTRAVENOUS
  Filled 2019-06-28: qty 250

## 2019-06-28 NOTE — Progress Notes (Signed)
Patient is here today to establish care for anemia. Patient states that she feels tired and fatigued.

## 2019-06-28 NOTE — Progress Notes (Signed)
[place]  [phone]) [phone] [phone]) [phone]  ID: Jasmine Acosta OB: 12/18/1950  MR#: [id number]  [id number]  Patient Care Team: Kirk Ruths, MD as PCP - General (Internal Medicine)  CHIEF COMPLAINT: Iron deficiency anemia.  INTERVAL HISTORY: Patient is a 69 year old female who was noted to have a declining hemoglobin and iron stores.  She has increased weakness and fatigue, but otherwise feels well.  She has no neurologic complaints.  She denies any recent fevers or illnesses.  She has a good appetite and denies weight loss.  She has no chest pain, shortness of breath, cough, or hemoptysis.  She denies any nausea, vomiting, constipation, or diarrhea.  She has no melena or hematochezia.  She has no urinary complaints.  Patient otherwise feels well and offers no further specific complaints today.  REVIEW OF SYSTEMS:   Review of Systems  Constitutional: Positive for malaise/fatigue. Negative for fever and weight loss.  Respiratory: Negative.  Negative for cough, hemoptysis and shortness of breath.   Cardiovascular: Negative.  Negative for chest pain and leg swelling.  Gastrointestinal: Negative.  Negative for abdominal pain, blood in stool and melena.  Genitourinary: Negative.  Negative for hematuria.  Musculoskeletal: Negative.  Negative for back pain.  Skin: Negative.  Negative for rash.  Neurological: Positive for weakness. Negative for dizziness, focal weakness and headaches.  Psychiatric/Behavioral: Negative.  The patient is not nervous/anxious.     As per HPI. Otherwise, a complete review of systems is negative.  PAST MEDICAL HISTORY: Past Medical History:  Diagnosis Date   Carotid artery occlusion    COPD (chronic obstructive pulmonary disease) ([place]) 07/06/2015   Headache    Hypertension    Hyperthyroidism    ablated with iodine   Peripheral vascular disease ([place])    Bilateral Carotid Artery Disease    PAST SURGICAL  HISTORY: Past Surgical History:  Procedure Laterality Date   ABDOMINAL HYSTERECTOMY     heavy bleeding   CATARACT EXTRACTION W/PHACO Right 10/09/2018   Procedure: CATARACT EXTRACTION PHACO AND INTRAOCULAR LENS PLACEMENT ([place]) RIGHT;  Surgeon: Leandrew Koyanagi, MD;  Location: [place];  Service: Ophthalmology;  Laterality: Right;   CATARACT EXTRACTION W/PHACO Left 01/01/2019   Procedure: CATARACT EXTRACTION PHACO AND INTRAOCULAR LENS PLACEMENT ([place])  LEFT;  Surgeon: Leandrew Koyanagi, MD;  Location: [place];  Service: Ophthalmology;  Laterality: Left;   ENDARTERECTOMY Right 06/14/2018   Procedure: ENDARTERECTOMY CAROTID;  Surgeon: Algernon Huxley, MD;  Location: [facility];  Service: Vascular;  Laterality: Right;   hallux vagus correction Right    HALLUX VALGUS AUSTIN Left 07/01/2016   Procedure: HALLUX VALGUS AUSTIN left foot;  Surgeon: Sharlotte Alamo, DPM;  Location: [facility];  Service: Podiatry;  Laterality: Left;   LOWER EXTREMITY ANGIOGRAPHY Right 11/05/2018   Procedure: LOWER EXTREMITY ANGIOGRAPHY;  Surgeon: Algernon Huxley, MD;  Location: [place] [facility];  Service: Cardiovascular;  Laterality: Right;    FAMILY HISTORY: Family History  Problem Relation Age of Onset   Diabetes Mother    Emphysema Father    Heart disease Sister    Heart disease Brother    Breast cancer Neg Hx    Ovarian cancer Neg Hx    Colon cancer Neg Hx     ADVANCED DIRECTIVES (Y/N):  N  HEALTH MAINTENANCE: Social History   Tobacco Use   Smoking status: Former Smoker    Packs/day: 0.25    Years: 48.00    Pack years: 12.00    Types: Cigarettes  Quit date: 06/14/2018    Years since quitting: 1.0   Smokeless tobacco: Never Used   Tobacco comment: since age 36  Substance Use Topics   Alcohol use: No   Drug use: No     Colonoscopy:  PAP:  Bone density:  Lipid panel:  Allergies  Allergen Reactions   Lisinopril Other (See Comments)    Other reaction(s):  Angioedema Of tongue only   Codeine Nausea And Vomiting    Current Outpatient Medications  Medication Sig Dispense Refill   albuterol (VENTOLIN HFA) 108 (90 Base) MCG/ACT inhaler Inhale 2 puffs into the lungs every 6 (six) hours as needed for wheezing or shortness of breath. 8 g 0   amLODipine (NORVASC) 5 MG tablet Take 5 mg by mouth daily.   11   aspirin EC 81 MG tablet Take 1 tablet (81 mg total) by mouth daily. 150 tablet 2   clopidogrel (PLAVIX) 75 MG tablet Take 1 tablet (75 mg total) by mouth daily with breakfast. 30 tablet 11   iron polysaccharides (NU-IRON) 150 MG capsule Take 1 capsule by mouth 1 day or 1 dose.     isosorbide mononitrate (IMDUR) 30 MG 24 hr tablet Take 1 tablet by mouth 1 day or 1 dose.     meloxicam (MOBIC) 15 MG tablet Take 15 mg by mouth daily as needed for pain.     metoprolol succinate (TOPROL-XL) 25 MG 24 hr tablet Take 1 tablet by mouth 1 day or 1 dose.     Omeprazole 20 MG TBEC Take 1 tablet by mouth 1 day or 1 dose.     Polyethyl Glycol-Propyl Glycol (SYSTANE OP) Apply to eye 3 (three) times daily as needed.     potassium chloride (K-DUR,KLOR-CON) 10 MEQ tablet Take 10 mEq by mouth 2 (two) times daily.     rosuvastatin (CRESTOR) 40 MG tablet Take 40 mg by mouth daily.      gabapentin (NEURONTIN) 300 MG capsule Take 300 mg by mouth 3 (three) times daily.      No current facility-administered medications for this visit.    Facility-Administered Medications Ordered in Other Visits  Medication Dose Route Frequency Provider Last Rate Last Dose   ferumoxytol (FERAHEME) 510 mg in sodium chloride 0.9 % 100 mL IVPB  510 mg Intravenous Once Lloyd Huger, MD 468 mL/hr at 06/28/19 1450 510 mg at 06/28/19 1450    OBJECTIVE: Vitals:   06/28/19 1324  BP: 126/71  Pulse: 81  Temp: (!) 97.5 F (36.4 C)     Body mass index is 24 kg/m.    ECOG FS:1 - Symptomatic but completely ambulatory  General: Well-developed, well-nourished, no acute  distress. Eyes: Pink conjunctiva, anicteric sclera. HEENT: Normocephalic, moist mucous membranes, clear oropharnyx. Lungs: Clear to auscultation bilaterally. Heart: Regular rate and rhythm. No rubs, murmurs, or gallops. Abdomen: Soft, nontender, nondistended. No organomegaly noted, normoactive bowel sounds. Musculoskeletal: No edema, cyanosis, or clubbing. Neuro: Alert, answering all questions appropriately. Cranial nerves grossly intact. Skin: No rashes or petechiae noted. Psych: Normal affect. Lymphatics: No cervical, calvicular, axillary or inguinal LAD.   LAB RESULTS:  Lab Results  Component Value Date   NA 137 06/11/2019   K 3.2 (L) 06/11/2019   CL 105 06/11/2019   CO2 23 06/11/2019   GLUCOSE 110 (H) 06/11/2019   BUN 9 06/11/2019   CREATININE 0.65 06/11/2019   CALCIUM 9.2 06/11/2019   PROT 7.7 06/11/2019   ALBUMIN 4.2 06/11/2019   AST 18 06/11/2019   ALT  17 06/11/2019   ALKPHOS 67 06/11/2019   BILITOT 0.6 06/11/2019   GFRNONAA >60 06/11/2019   GFRAA >60 06/11/2019    Lab Results  Component Value Date   WBC 8.3 06/11/2019   NEUTROABS 2.8 06/30/2016   HGB 9.3 (L) 06/11/2019   HCT 32.9 (L) 06/11/2019   MCV 75.8 (L) 06/11/2019   PLT 200 06/11/2019     STUDIES: Dg Chest 2 View  Result Date: 06/11/2019 CLINICAL DATA:  Right chest pressure, shortness of breath, chest pain EXAM: CHEST - 2 VIEW COMPARISON:  None FINDINGS: Mild hyperinflation of the lungs. Biapical scarring. Heart and mediastinal contours are within normal limits. No focal opacities or effusions. No acute bony abnormality. IMPRESSION: Hyperinflation/COPD.  Chronic changes.  No active disease. Electronically Signed   By: Rolm Baptise M.D.   On: 06/11/2019 12:41   Vas [place] Burnard Bunting With/wo Tbi  Result Date: 06/07/2019 LOWER EXTREMITY DOPPLER STUDY Indications: Peripheral artery disease. High Risk Factors: Hypertension.  Vascular Interventions: 11/05/18: Bilateral CIA stents;. Performing Technologist: Blondell Reveal RT, RDMS, RVT  Examination Guidelines: A complete evaluation includes at minimum, Doppler waveform signals and systolic blood pressure reading at the level of bilateral brachial, anterior tibial, and posterior tibial arteries, when vessel segments are accessible. Bilateral testing is considered an integral part of a complete examination. Photoelectric Plethysmograph (PPG) waveforms and toe systolic pressure readings are included as required and additional duplex testing as needed. Limited examinations for reoccurring indications may be performed as noted.  ABI Findings: +---------+------------------+-----+---------+---------+  Right     Rt Pressure (mmHg) Index Waveform  Comment    +---------+------------------+-----+---------+---------+  Brachial  148                                           +---------+------------------+-----+---------+---------+  CFA                                triphasic by duplex  +---------+------------------+-----+---------+---------+  ATA       150                1.01  triphasic            +---------+------------------+-----+---------+---------+  PTA       146                0.99  triphasic            +---------+------------------+-----+---------+---------+  Great Toe 119                0.80  Normal               +---------+------------------+-----+---------+---------+ +---------+------------------+-----+---------+---------+  Left      Lt Pressure (mmHg) Index Waveform  Comment    +---------+------------------+-----+---------+---------+  Brachial  148                                           +---------+------------------+-----+---------+---------+  CFA                                triphasic by duplex  +---------+------------------+-----+---------+---------+  ATA       141  0.95  triphasic            +---------+------------------+-----+---------+---------+  PTA       156                1.05  triphasic             +---------+------------------+-----+---------+---------+  Great Toe 115                0.78  Normal               +---------+------------------+-----+---------+---------+ +-------+-----------+-----------+------------+------------+  ABI/TBI Today's ABI Today's TBI Previous ABI Previous TBI  +-------+-----------+-----------+------------+------------+  Right   1.01        0.80        1.02         not done      +-------+-----------+-----------+------------+------------+  Left    1.05        0.78        0.92         not done      +-------+-----------+-----------+------------+------------+ Limited duplex of bilateral groin areas demonstrated no extravascular structures.  Bilateral ABIs appear essentially unchanged compared to prior study on 12/04/18.  Summary: Bilateral: Bilateral ankle-brachial indexes are within normal range. No evidence of significant lower extremity arterial disease. Bilateral toe-brachial indexes are within normal range.  *See table(s) above for measurements and observations.  Electronically signed by Leotis Pain MD on 06/07/2019 at 12:04:08 PM.   Final    Ct Angio Chest/abd/pel For Dissection W And/or Wo Contrast  Result Date: 06/11/2019 CLINICAL DATA:  Chest pain, back pain EXAM: CT ANGIOGRAPHY CHEST, ABDOMEN AND PELVIS TECHNIQUE: Multidetector CT imaging through the chest, abdomen and pelvis was performed using the standard protocol during bolus administration of intravenous contrast. Multiplanar reconstructed images and MIPs were obtained and reviewed to evaluate the vascular anatomy. CONTRAST:  143mL OMNIPAQUE IOHEXOL 350 MG/ML SOLN COMPARISON:  08/30/2018 FINDINGS: Technical note: Examination is slightly limited as scan occurred in the late arterial phase. CTA CHEST FINDINGS Cardiovascular: Normal heart size. No pericardial effusion. Thoracic aorta is normal in course and caliber without aneurysm or dissection. Scattered atherosclerotic calcifications of the aorta and coronary arteries. No  filling defect within the central pulmonary vasculature. Mediastinum/Nodes: Multinodular thyroid. Trachea and esophagus are within normal limits. No axillary, mediastinal, or hilar lymphadenopathy. Lungs/Pleura: Lungs are clear without focal consolidation, pleural effusion, or pneumothorax. Mild biapical pleuroparenchymal scarring. Musculoskeletal: No chest wall abnormality. No acute or significant osseous findings. Review of the MIP images confirms the above findings. CTA ABDOMEN AND PELVIS FINDINGS VASCULAR Aorta: Moderate atherosclerotic calcifications of the abdominal aorta. No aneurysm. No dissection. Celiac: Moderate atherosclerotic changes at the origin of the celiac artery within approximately 50% stenosis, which is similar to the previous study. Branches appear patent SMA: Mild atherosclerotic calcifications of the origin without evidence of significant stenosis. Renals: Mild atherosclerotic calcifications of the origins the bilateral renal arteries. Single renal arteries bilaterally. No high-grade stenosis. IMA: Patent. Inflow: Patent. Veins: No obvious venous abnormality within the limitations of this arterial phase study. Review of the MIP images confirms the above findings. NON-VASCULAR Hepatobiliary: No focal liver abnormality is seen. No gallstones, gallbladder wall thickening, or biliary dilatation. Pancreas: Unremarkable. No pancreatic ductal dilatation or surrounding inflammatory changes. Spleen: Normal in size without focal abnormality. Adrenals/Urinary Tract: Unremarkable adrenal glands. 1.5 cm superior pole right renal cyst. Kidneys enhance symmetrically. Urinary bladder unremarkable. Ureters nondilated. Stomach/Bowel: Stomach is within normal limits. Appendix not clearly visualized. No evidence of bowel wall  thickening, distention, or inflammatory changes. Lymphatic: No abdominopelvic lymphadenopathy. Reproductive: Status post hysterectomy. No adnexal masses. Other: Fat containing umbilical  hernia.  No ascites. Musculoskeletal: Degenerative endplate changes at [id number] similar to prior. No acute osseous findings. Review of the MIP images confirms the above findings. IMPRESSION: 1. Slightly limited exam secondary to phase of contrast. Within these limitations there is no acute vascular abnormality. No aortic dissection or aneurysm. 2. No acute findings within the chest, abdomen, or pelvis. 3. Moderate atherosclerosis of the thoracic aorta and coronary arteries within the chest. Moderate aortoiliac atherosclerotic calcifications, similar in appearance to prior study when accounting for slight differences in technique. Electronically Signed   By: Davina Poke M.D.   On: 06/11/2019 14:10   Vas US Carotid  Result Date: 06/07/2019 Carotid Arterial Duplex Study Indications:   Carotid artery disease. Other Factors: 06/14/2018 Right endarterectomy. Performing Technologist: Blondell Reveal RT, RDMS, RVT  Examination Guidelines: A complete evaluation includes B-mode imaging, spectral Doppler, color Doppler, and power Doppler as needed of all accessible portions of each vessel. Bilateral testing is considered an integral part of a complete examination. Limited examinations for reoccurring indications may be performed as noted.  Right Carotid Findings: +----------+--------+--------+--------+------------+------------------+             PSV cm/s EDV cm/s Stenosis Describe     Comments            +----------+--------+--------+--------+------------+------------------+  CCA Prox   70       14                                                 +----------+--------+--------+--------+------------+------------------+  CCA Mid    103      25       <50%     heterogenous                     +----------+--------+--------+--------+------------+------------------+  CCA Distal 86       15                                                 +----------+--------+--------+--------+------------+------------------+  ICA Prox   37       10                              intimal thickening  +----------+--------+--------+--------+------------+------------------+  ICA Mid    88       30                                                 +----------+--------+--------+--------+------------+------------------+  ICA Distal 78       22                                                 +----------+--------+--------+--------+------------+------------------+  ECA        68       12  heterogenous                     +----------+--------+--------+--------+------------+------------------+  Left Carotid Findings: +----------+--------+--------+--------+------------+-----------------+             PSV cm/s EDV cm/s Stenosis Describe     Comments           +----------+--------+--------+--------+------------+-----------------+  CCA Prox   92       20                                                +----------+--------+--------+--------+------------+-----------------+  CCA Mid    68       18                heterogenous                    +----------+--------+--------+--------+------------+-----------------+  CCA Distal 70       19                heterogenous                    +----------+--------+--------+--------+------------+-----------------+  ICA Prox   116      28       1-39%    heterogenous ICA/CCA ratio=1.6  +----------+--------+--------+--------+------------+-----------------+  ICA Mid    94       25                                                +----------+--------+--------+--------+------------+-----------------+  ICA Distal 79       20                                                +----------+--------+--------+--------+------------+-----------------+  ECA        157      19       >50%     heterogenous                    +----------+--------+--------+--------+------------+-----------------+  Summary: Right Carotid: There is no evidence of stenosis in the right ICA.                Non-hemodynamically significant plaque <50% noted in the CCA. Left Carotid:  Velocities in the left ICA are consistent with a 1-39% stenosis.               The ECA appears >50% stenosed. Vertebrals:  Bilateral vertebral arteries demonstrate antegrade flow. Subclavians: Normal flow hemodynamics were seen in bilateral subclavian              arteries. No significant change noted when compared to the previous exam on 10/12/18. *See table(s) above for measurements and observations.   Electronically signed by Leotis Pain MD on 06/07/2019 at 12:04:06 PM.    Final     ASSESSMENT: Iron deficiency anemia.  PLAN:    1.  Iron deficiency anemia: Patient's most recent hemoglobin was 7.6 with a ferritin of 5 and a total iron of 13.  Iron saturation is 3%.  She is also mildly symptomatic.  She  has been evaluated by GI with plans to do an endoscopy on July 05, 2019.  Patient also may require repeat colonoscopy in the near future. Proceed with 510 mg IV Feraheme today and then return to clinic on July 08, 2019 to receive a second infusion.  She will then return to clinic in 2 months for repeat laboratory work, further evaluation, and consideration of additional iron.    I spent a total of 45 minutes face-to-face with the patient of which greater than 50% of the visit was spent in counseling and coordination of care as detailed above.  Patient expressed understanding and was in agreement with this plan. She also understands that She can call clinic at any time with any questions, concerns, or complaints.    Lloyd Huger, MD   06/28/2019 2:54 PM

## 2019-07-02 ENCOUNTER — Other Ambulatory Visit: Payer: Self-pay

## 2019-07-02 ENCOUNTER — Other Ambulatory Visit
Admission: RE | Admit: 2019-07-02 | Discharge: 2019-07-02 | Disposition: A | Discharge status: Home / Self Care | Payer: PPO | Source: Ambulatory Visit | Attending: Gastroenterology | Admitting: Gastroenterology

## 2019-07-02 DIAGNOSIS — Z20828 Contact with and (suspected) exposure to other viral communicable diseases: Secondary | ICD-10-CM | POA: Insufficient documentation

## 2019-07-02 DIAGNOSIS — Z01812 Encounter for preprocedural laboratory examination: Secondary | ICD-10-CM | POA: Insufficient documentation

## 2019-07-02 LAB — SARS CORONAVIRUS 2 (TAT 6-24 HRS): SARS Coronavirus 2: NEGATIVE

## 2019-07-04 ENCOUNTER — Encounter: Payer: Self-pay | Admitting: *Deleted

## 2019-07-04 DIAGNOSIS — D509 Iron deficiency anemia, unspecified: Secondary | ICD-10-CM | POA: Diagnosis not present

## 2019-07-05 ENCOUNTER — Encounter
Admission: RE | Disposition: A | Discharge status: Home / Self Care | Payer: Self-pay | Source: Home / Self Care | Attending: Gastroenterology

## 2019-07-05 ENCOUNTER — Ambulatory Visit
Admission: RE | Admit: 2019-07-05 | Discharge: 2019-07-05 | Disposition: A | Discharge status: Home / Self Care | Payer: PPO | Attending: Gastroenterology | Admitting: Gastroenterology

## 2019-07-05 ENCOUNTER — Ambulatory Visit: Payer: PPO | Admitting: Anesthesiology

## 2019-07-05 ENCOUNTER — Ambulatory Visit: Admission: RE | Admit: 2019-07-05 | Payer: PPO | Source: Home / Self Care | Admitting: Gastroenterology

## 2019-07-05 ENCOUNTER — Encounter: Admission: RE | Payer: Self-pay | Source: Home / Self Care

## 2019-07-05 ENCOUNTER — Other Ambulatory Visit: Payer: Self-pay

## 2019-07-05 DIAGNOSIS — Z7982 Long term (current) use of aspirin: Secondary | ICD-10-CM | POA: Insufficient documentation

## 2019-07-05 DIAGNOSIS — J449 Chronic obstructive pulmonary disease, unspecified: Secondary | ICD-10-CM | POA: Diagnosis not present

## 2019-07-05 DIAGNOSIS — Z87891 Personal history of nicotine dependence: Secondary | ICD-10-CM | POA: Diagnosis not present

## 2019-07-05 DIAGNOSIS — Z7902 Long term (current) use of antithrombotics/antiplatelets: Secondary | ICD-10-CM | POA: Insufficient documentation

## 2019-07-05 DIAGNOSIS — I739 Peripheral vascular disease, unspecified: Secondary | ICD-10-CM | POA: Diagnosis not present

## 2019-07-05 DIAGNOSIS — K921 Melena: Secondary | ICD-10-CM | POA: Insufficient documentation

## 2019-07-05 DIAGNOSIS — E785 Hyperlipidemia, unspecified: Secondary | ICD-10-CM | POA: Diagnosis not present

## 2019-07-05 DIAGNOSIS — Z885 Allergy status to narcotic agent status: Secondary | ICD-10-CM | POA: Insufficient documentation

## 2019-07-05 DIAGNOSIS — Z5309 Procedure and treatment not carried out because of other contraindication: Secondary | ICD-10-CM | POA: Insufficient documentation

## 2019-07-05 DIAGNOSIS — Z888 Allergy status to other drugs, medicaments and biological substances status: Secondary | ICD-10-CM | POA: Insufficient documentation

## 2019-07-05 DIAGNOSIS — I10 Essential (primary) hypertension: Secondary | ICD-10-CM | POA: Insufficient documentation

## 2019-07-05 DIAGNOSIS — I2510 Atherosclerotic heart disease of native coronary artery without angina pectoris: Secondary | ICD-10-CM | POA: Diagnosis not present

## 2019-07-05 DIAGNOSIS — R1310 Dysphagia, unspecified: Secondary | ICD-10-CM | POA: Diagnosis not present

## 2019-07-05 DIAGNOSIS — Z79899 Other long term (current) drug therapy: Secondary | ICD-10-CM | POA: Diagnosis not present

## 2019-07-05 HISTORY — DX: Atherosclerotic heart disease of native coronary artery without angina pectoris: I25.10

## 2019-07-05 HISTORY — PX: ESOPHAGOGASTRODUODENOSCOPY: SHX5428

## 2019-07-05 HISTORY — DX: Spinal stenosis, lumbar region with neurogenic claudication: M48.062

## 2019-07-05 HISTORY — DX: Personal history of (corrected) congenital malformations of heart and circulatory system: Z87.74

## 2019-07-05 SURGERY — EGD (ESOPHAGOGASTRODUODENOSCOPY)
Anesthesia: General

## 2019-07-05 SURGERY — ESOPHAGOGASTRODUODENOSCOPY (EGD) WITH PROPOFOL
Anesthesia: General

## 2019-07-05 MED ORDER — PROPOFOL 10 MG/ML IV BOLUS
INTRAVENOUS | Status: DC | PRN
  Administered 2019-07-05: 40 mg via INTRAVENOUS
  Administered 2019-07-05: 60 mg via INTRAVENOUS

## 2019-07-05 MED ORDER — PROPOFOL 500 MG/50ML IV EMUL
INTRAVENOUS | Status: DC | PRN
  Administered 2019-07-05: 200 ug/kg/min via INTRAVENOUS

## 2019-07-05 MED ORDER — LIDOCAINE 2% (20 MG/ML) 5 ML SYRINGE
INTRAMUSCULAR | Status: DC | PRN
  Administered 2019-07-05: 100 mg via INTRAVENOUS

## 2019-07-05 MED ORDER — PHENYLEPHRINE HCL (PRESSORS) 10 MG/ML IV SOLN
INTRAVENOUS | Status: DC | PRN
  Administered 2019-07-05: 100 ug via INTRAVENOUS

## 2019-07-05 MED ORDER — SODIUM CHLORIDE 0.9 % IV SOLN
INTRAVENOUS | Status: DC
  Administered 2019-07-05: 09:00:00 via INTRAVENOUS

## 2019-07-05 MED ORDER — GLYCOPYRROLATE 0.2 MG/ML IJ SOLN
INTRAMUSCULAR | Status: DC | PRN
  Administered 2019-07-05: 0.2 mg via INTRAVENOUS

## 2019-07-05 NOTE — H&P (Signed)
Outpatient short stay form Pre-procedure 07/05/2019 8:30 AM Lollie Sails MD  Primary Physician: Dr. Frazier Richards  Reason for visit: EGD  History of present illness: Patient is a 68 year old female presenting today for an EGD in regards to her recent history of melena.  Several weeks ago she had a bout of several days of black stools.  She has had intermittent black stools since then.  She has been on Plavix as well as aspirin in the past.  She states she has not been on the Plavix for a week.  He also stopped her aspirin several days ago.  SHe did undergo blood transfusion last week.  Most recent hemoglobin was 8.4.  Lately count 334    Current Facility-Administered Medications:  .  0.9 %  sodium chloride infusion, , Intravenous, Continuous, Lollie Sails, MD  Medications Prior to Admission  Medication Sig Dispense Refill Last Dose  . amLODipine (NORVASC) 5 MG tablet Take 5 mg by mouth daily.   11 07/05/2019 at 0600  . aspirin EC 81 MG tablet Take 1 tablet (81 mg total) by mouth daily. 150 tablet 2 Past Week at Unknown time  . iron polysaccharides (NU-IRON) 150 MG capsule Take 1 capsule by mouth 1 day or 1 dose.   07/05/2019 at 0600  . isosorbide mononitrate (IMDUR) 30 MG 24 hr tablet Take 1 tablet by mouth 1 day or 1 dose.   07/05/2019 at 0600  . meloxicam (MOBIC) 15 MG tablet Take 15 mg by mouth daily as needed for pain.   07/04/2019 at Unknown time  . metoprolol succinate (TOPROL-XL) 25 MG 24 hr tablet Take 1 tablet by mouth 1 day or 1 dose.   07/05/2019 at 0600  . Omeprazole 20 MG TBEC Take 1 tablet by mouth 1 day or 1 dose.   07/05/2019 at 0600  . potassium chloride (K-DUR,KLOR-CON) 10 MEQ tablet Take 10 mEq by mouth 2 (two) times daily.   07/04/2019 at Unknown time  . albuterol (VENTOLIN HFA) 108 (90 Base) MCG/ACT inhaler Inhale 2 puffs into the lungs every 6 (six) hours as needed for wheezing or shortness of breath. (Patient not taking: Reported on 07/05/2019) 8 g 0 Not Taking  at Unknown time  . clopidogrel (PLAVIX) 75 MG tablet Take 1 tablet (75 mg total) by mouth daily with breakfast. 30 tablet 11 07/01/2019  . gabapentin (NEURONTIN) 300 MG capsule Take 300 mg by mouth 3 (three) times daily.      Vladimir Faster Glycol-Propyl Glycol (SYSTANE OP) Apply to eye 3 (three) times daily as needed.     . rosuvastatin (CRESTOR) 40 MG tablet Take 40 mg by mouth daily.         Allergies  Allergen Reactions  . Lisinopril Other (See Comments)    Other reaction(s): Angioedema Of tongue only  . Codeine Nausea And Vomiting     Past Medical History:  Diagnosis Date  . Carotid artery occlusion   . COPD (chronic obstructive pulmonary disease) ([place]) 07/06/2015  . Coronary artery disease   . Headache   . Hypertension   . Hyperthyroidism    ablated with iodine  . Peripheral vascular disease ([place])    Bilateral Carotid Artery Disease  . Personal history of arterial venous malformation ([facility])   . Pseudoclaudication     Review of systems:      Physical Exam    Heart and lungs: Regular rate and rhythm without rub or gallop lungs are bilaterally clear    HEENT: Normocephalic  atraumatic eyes are anicteric    Other:    Pertinant exam for procedure: Soft nontender nondistended bowel sounds positive normoactive    Planned proceedures: EGD and indicated procedures. I have discussed the risks benefits and complications of procedures to include not limited to bleeding, infection, perforation and the risk of sedation and the patient wishes to proceed.    Lollie Sails, MD Gastroenterology 07/05/2019  8:30 AM

## 2019-07-05 NOTE — Anesthesia Postprocedure Evaluation (Signed)
Anesthesia Post Note  Patient: Jasmine Acosta  Procedure(s) Performed: ESOPHAGOGASTRODUODENOSCOPY (EGD) (N/A )  Patient location during evaluation: PACU Anesthesia Type: General Level of consciousness: awake and alert Pain management: pain level controlled Vital Signs Assessment: post-procedure vital signs reviewed and stable Respiratory status: spontaneous breathing, nonlabored ventilation, respiratory function stable and patient connected to nasal cannula oxygen Cardiovascular status: blood pressure returned to baseline and stable Postop Assessment: no apparent nausea or vomiting Anesthetic complications: no     Last Vitals:  Vitals:   07/05/19 0920 07/05/19 0930  BP: 117/76 124/75  Pulse: 83 82  Resp: 17 12  Temp:    SpO2: 100% 100%    Last Pain:  Vitals:   07/05/19 0754  PainSc: 0-No pain                 Molli Barrows

## 2019-07-05 NOTE — Transfer of Care (Signed)
Immediate Anesthesia Transfer of Care Note  Patient: Jasmine Acosta  Procedure(s) Performed: ESOPHAGOGASTRODUODENOSCOPY (EGD) (N/A )  Patient Location: Endoscopy Unit  Anesthesia Type:General  Level of Consciousness: sedated  Airway & Oxygen Therapy: Patient connected to nasal cannula oxygen  Post-op Assessment: Post -op Vital signs reviewed and stable  Post vital signs: stable  Last Vitals:  Vitals Value Taken Time  BP 100/65 07/05/19 0900  Temp    Pulse 75 07/05/19 0901  Resp 19 07/05/19 0901  SpO2 100 % 07/05/19 0901  Vitals shown include unvalidated device data.  Last Pain:  Vitals:   07/05/19 0754  PainSc: 0-No pain         Complications: No apparent anesthesia complications

## 2019-07-05 NOTE — Brief Op Note (Signed)
Procedure aborted after sedation due to pt took Plavix 4 days ago, per MD.

## 2019-07-05 NOTE — Anesthesia Post-op Follow-up Note (Signed)
Anesthesia QCDR form completed.        

## 2019-07-05 NOTE — Op Note (Signed)
[facility] Gastroenterology Patient Name: Jasmine Acosta Procedure Date: 07/05/2019 8:23 AM MRN: [id number] Account #: [id number] Date of Birth: 04/25/1951 Admit Type: Outpatient Age: 68 Room: [facility] ENDO ROOM 3 Gender: Female Note Status: Finalized Procedure:            Upper GI endoscopy Providers:            Lollie Sails, MD Complications:        No immediate complications. Procedure:            Pre-Anesthesia Assessment:                       - ASA Grade Assessment: III - A patient with severe                        systemic disease.                       After obtaining informed consent, the endoscope was                        passed under direct vision. Throughout the procedure,                        the patient's blood pressure, pulse, and oxygen                        saturations were monitored continuously. The procedure                        was aborted. The scope was not inserted. Medications                        were given. Findings:      scope was advanced into the posterior pharynx. I noted the mucosa to be       very friable. On questioning nursing, it was related that she had not       stopped plavix as instructed, had told nursing a different timing than       related to me. As such, I feel procedure would be higher risk for       bleeding complication if continued.      aborted procedure Impression:           - No specimens collected.                       - The procedure was aborted. Recommendation:       - Hold plavix for appropriate time, reschedule                        procedure.                       - Discharge patient to home.                       - reschedule procedure, off all anticoagulation as                        instructed. Lollie Sails, MD 07/05/2019 9:25:40 AM This report has been signed electronically. Number of Addenda: 0 Note Initiated On: 07/05/2019 8:23 AM  [facility]

## 2019-07-05 NOTE — Anesthesia Preprocedure Evaluation (Signed)
Anesthesia Evaluation  Patient identified by MRN, date of birth, ID band Patient awake    Reviewed: Allergy & Precautions, H&P , NPO status , Patient's Chart, lab work & pertinent test results, reviewed documented beta blocker date and time   Airway Mallampati: II   Neck ROM: full    Dental  (+) Poor Dentition   Pulmonary neg pulmonary ROS, COPD, former smoker,    Pulmonary exam normal        Cardiovascular Exercise Tolerance: Poor hypertension, On Medications + CAD and + Peripheral Vascular Disease  negative cardio ROS Normal cardiovascular exam Rhythm:regular Rate:Normal     Neuro/Psych  Headaches, negative neurological ROS  negative psych ROS   GI/Hepatic negative GI ROS, Neg liver ROS,   Endo/Other  negative endocrine ROSHyperthyroidism   Renal/GU negative Renal ROS  negative genitourinary   Musculoskeletal   Abdominal   Peds  Hematology negative hematology ROS (+) Blood dyscrasia, anemia ,   Anesthesia Other Findings Past Medical History: No date: Carotid artery occlusion 07/06/2015: COPD (chronic obstructive pulmonary disease) (HCC) No date: Coronary artery disease No date: Headache No date: Hypertension No date: Hyperthyroidism     Comment:  ablated with iodine No date: Peripheral vascular disease (HCC)     Comment:  Bilateral Carotid Artery Disease No date: Personal history of arterial venous malformation (AVM) No date: Pseudoclaudication Past Surgical History: No date: ABDOMINAL HYSTERECTOMY     Comment:  heavy bleeding 10/09/2018: CATARACT EXTRACTION W/PHACO; Right     Comment:  Procedure: CATARACT EXTRACTION PHACO AND INTRAOCULAR               LENS PLACEMENT ([place]) RIGHT;  Surgeon: Leandrew Koyanagi, MD;  Location: [place];  Service:               Ophthalmology;  Laterality: Right; 01/01/2019: CATARACT EXTRACTION W/PHACO; Left     Comment:  Procedure: CATARACT  EXTRACTION PHACO AND INTRAOCULAR               LENS PLACEMENT ([place])  LEFT;  Surgeon: Leandrew Koyanagi, MD;  Location: [place];  Service:               Ophthalmology;  Laterality: Left; No date: CORONARY ANGIOPLASTY 06/14/2018: ENDARTERECTOMY; Right     Comment:  Procedure: ENDARTERECTOMY CAROTID;  Surgeon: Algernon Huxley, MD;  Location: [facility];  Service: Vascular;                Laterality: Right; No date: EYE SURGERY No date: hallux vagus correction; Right 07/01/2016: [place]; Left     Comment:  Procedure: [place] left foot;  Surgeon:               Sharlotte Alamo, DPM;  Location: [facility];  Service: Podiatry;              Laterality: Left; 11/05/2018: LOWER EXTREMITY ANGIOGRAPHY; Right     Comment:  Procedure: LOWER EXTREMITY ANGIOGRAPHY;  Surgeon: Algernon Huxley, MD;  Location: [place] CV LAB;  Service:               Cardiovascular;  Laterality: Right;   Reproductive/Obstetrics  negative OB ROS                             Anesthesia Physical Anesthesia Plan  ASA: III  Anesthesia Plan: General   Post-op Pain Management:    Induction:   PONV Risk Score and Plan:   Airway Management Planned:   Additional Equipment:   Intra-op Plan:   Post-operative Plan:   Informed Consent: I have reviewed the patients History and Physical, chart, labs and discussed the procedure including the risks, benefits and alternatives for the proposed anesthesia with the patient or authorized representative who has indicated his/her understanding and acceptance.     Dental Advisory Given  Plan Discussed with: CRNA  Anesthesia Plan Comments:         Anesthesia Quick Evaluation

## 2019-07-08 ENCOUNTER — Ambulatory Visit
Admission: RE | Admit: 2019-07-08 | Discharge: 2019-07-08 | Disposition: A | Discharge status: Home / Self Care | Payer: PPO | Attending: Gastroenterology | Admitting: Gastroenterology

## 2019-07-08 ENCOUNTER — Ambulatory Visit: Payer: PPO | Admitting: Anesthesiology

## 2019-07-08 ENCOUNTER — Inpatient Hospital Stay: Payer: PPO

## 2019-07-08 ENCOUNTER — Encounter: Payer: Self-pay | Admitting: *Deleted

## 2019-07-08 ENCOUNTER — Other Ambulatory Visit: Payer: Self-pay

## 2019-07-08 ENCOUNTER — Encounter
Admission: RE | Disposition: A | Discharge status: Home / Self Care | Payer: Self-pay | Source: Home / Self Care | Attending: Gastroenterology

## 2019-07-08 DIAGNOSIS — I739 Peripheral vascular disease, unspecified: Secondary | ICD-10-CM | POA: Insufficient documentation

## 2019-07-08 DIAGNOSIS — D509 Iron deficiency anemia, unspecified: Secondary | ICD-10-CM | POA: Insufficient documentation

## 2019-07-08 DIAGNOSIS — I2510 Atherosclerotic heart disease of native coronary artery without angina pectoris: Secondary | ICD-10-CM | POA: Insufficient documentation

## 2019-07-08 DIAGNOSIS — Z7982 Long term (current) use of aspirin: Secondary | ICD-10-CM | POA: Diagnosis not present

## 2019-07-08 DIAGNOSIS — I10 Essential (primary) hypertension: Secondary | ICD-10-CM | POA: Diagnosis not present

## 2019-07-08 DIAGNOSIS — Z885 Allergy status to narcotic agent status: Secondary | ICD-10-CM | POA: Diagnosis not present

## 2019-07-08 DIAGNOSIS — Z888 Allergy status to other drugs, medicaments and biological substances status: Secondary | ICD-10-CM | POA: Insufficient documentation

## 2019-07-08 DIAGNOSIS — K31819 Angiodysplasia of stomach and duodenum without bleeding: Secondary | ICD-10-CM | POA: Diagnosis not present

## 2019-07-08 DIAGNOSIS — I6523 Occlusion and stenosis of bilateral carotid arteries: Secondary | ICD-10-CM | POA: Diagnosis not present

## 2019-07-08 DIAGNOSIS — J449 Chronic obstructive pulmonary disease, unspecified: Secondary | ICD-10-CM | POA: Insufficient documentation

## 2019-07-08 DIAGNOSIS — E785 Hyperlipidemia, unspecified: Secondary | ICD-10-CM | POA: Diagnosis not present

## 2019-07-08 DIAGNOSIS — Z7902 Long term (current) use of antithrombotics/antiplatelets: Secondary | ICD-10-CM | POA: Diagnosis not present

## 2019-07-08 DIAGNOSIS — K921 Melena: Secondary | ICD-10-CM | POA: Insufficient documentation

## 2019-07-08 DIAGNOSIS — Z79899 Other long term (current) drug therapy: Secondary | ICD-10-CM | POA: Diagnosis not present

## 2019-07-08 DIAGNOSIS — R1310 Dysphagia, unspecified: Secondary | ICD-10-CM | POA: Diagnosis not present

## 2019-07-08 HISTORY — PX: ESOPHAGOGASTRODUODENOSCOPY (EGD) WITH PROPOFOL: SHX5813

## 2019-07-08 SURGERY — ESOPHAGOGASTRODUODENOSCOPY (EGD) WITH PROPOFOL
Anesthesia: General

## 2019-07-08 MED ORDER — SODIUM CHLORIDE 0.9 % IV SOLN
INTRAVENOUS | Status: DC
  Administered 2019-07-08 (×2): via INTRAVENOUS

## 2019-07-08 MED ORDER — GLYCOPYRROLATE 0.2 MG/ML IJ SOLN
INTRAMUSCULAR | Status: DC | PRN
  Administered 2019-07-08: 0.2 mg via INTRAVENOUS

## 2019-07-08 MED ORDER — LIDOCAINE HCL (PF) 2 % IJ SOLN
INTRAMUSCULAR | Status: AC
  Filled 2019-07-08: qty 10

## 2019-07-08 MED ORDER — FENTANYL CITRATE (PF) 100 MCG/2ML IJ SOLN
INTRAMUSCULAR | Status: DC | PRN
  Administered 2019-07-08: 50 ug via INTRAVENOUS

## 2019-07-08 MED ORDER — PROPOFOL 500 MG/50ML IV EMUL
INTRAVENOUS | Status: DC | PRN
  Administered 2019-07-08: 180 ug/kg/min via INTRAVENOUS

## 2019-07-08 MED ORDER — PROPOFOL 10 MG/ML IV BOLUS
INTRAVENOUS | Status: DC | PRN
  Administered 2019-07-08 (×2): 40 mg via INTRAVENOUS
  Administered 2019-07-08: 20 mg via INTRAVENOUS

## 2019-07-08 MED ORDER — EPHEDRINE SULFATE 50 MG/ML IJ SOLN
INTRAMUSCULAR | Status: DC | PRN
  Administered 2019-07-08: 10 mg via INTRAVENOUS

## 2019-07-08 MED ORDER — PHENYLEPHRINE HCL (PRESSORS) 10 MG/ML IV SOLN
INTRAVENOUS | Status: DC | PRN
  Administered 2019-07-08: 100 ug via INTRAVENOUS

## 2019-07-08 MED ORDER — PROPOFOL 500 MG/50ML IV EMUL
INTRAVENOUS | Status: AC
  Filled 2019-07-08: qty 50

## 2019-07-08 MED ORDER — FENTANYL CITRATE (PF) 100 MCG/2ML IJ SOLN
INTRAMUSCULAR | Status: AC
  Filled 2019-07-08: qty 2

## 2019-07-08 NOTE — Anesthesia Preprocedure Evaluation (Signed)
Anesthesia Evaluation  Patient identified by MRN, date of birth, ID band Patient awake    Reviewed: Allergy & Precautions, NPO status , Patient's Chart, lab work & pertinent test results  History of Anesthesia Complications Negative for: history of anesthetic complications  Airway Mallampati: II       Dental   Pulmonary neg sleep apnea, COPD,  COPD inhaler, former smoker,           Cardiovascular hypertension, Pt. on medications (-) Past MI and (-) CHF (-) dysrhythmias (-) Valvular Problems/Murmurs     Neuro/Psych neg Seizures    GI/Hepatic Neg liver ROS, neg GERD  ,  Endo/Other  neg diabetesHyperthyroidism   Renal/GU negative Renal ROS     Musculoskeletal   Abdominal   Peds  Hematology  (+) anemia ,   Anesthesia Other Findings   Reproductive/Obstetrics                            Anesthesia Physical Anesthesia Plan  ASA: III  Anesthesia Plan: General   Post-op Pain Management:    Induction: Intravenous  PONV Risk Score and Plan: 3 and Ondansetron, Propofol infusion and TIVA  Airway Management Planned: Nasal Cannula  Additional Equipment:   Intra-op Plan:   Post-operative Plan:   Informed Consent: I have reviewed the patients History and Physical, chart, labs and discussed the procedure including the risks, benefits and alternatives for the proposed anesthesia with the patient or authorized representative who has indicated his/her understanding and acceptance.       Plan Discussed with:   Anesthesia Plan Comments:         Anesthesia Quick Evaluation

## 2019-07-08 NOTE — Transfer of Care (Signed)
Immediate Anesthesia Transfer of Care Note  Patient: Jasmine Acosta  Procedure(s) Performed: ESOPHAGOGASTRODUODENOSCOPY (EGD) WITH PROPOFOL (N/A )  Patient Location: PACU  Anesthesia Type:General  Level of Consciousness: sedated  Airway & Oxygen Therapy: Patient Spontanous Breathing and Patient connected to nasal cannula oxygen  Post-op Assessment: Report given to RN and Post -op Vital signs reviewed and stable  Post vital signs: Reviewed and stable  Last Vitals:  Vitals Value Taken Time  BP 103/71 07/08/19 1516  Temp 36.3 C 07/08/19 1513  Pulse 90 07/08/19 1519  Resp 14 07/08/19 1519  SpO2 94 % 07/08/19 1519  Vitals shown include unvalidated device data.  Last Pain:  Vitals:   07/08/19 1513  TempSrc: Tympanic  PainSc: Asleep         Complications: No apparent anesthesia complications

## 2019-07-08 NOTE — H&P (Signed)
Outpatient short stay form Pre-procedure 07/08/2019 2:33 PM Jasmine Sails MD  Primary Physician: Dr. Frazier Richards  Reason for visit: EGD  History of present illness: Patient is a 68 year old female presenting today for an EGD in regards to a history of melena.  I saw her last week on Friday for her procedure however had not stopped her Plavix for a long enough period of time.  Subsequently she is now been off her Plavix for about 6 days.  She is also been off a dose of aspirin daily.  States that since I saw her on Friday she has not had a bowel movement but there again she has had no black stools no abdominal pain nausea or vomiting.  I  also changed her omeprazole to 40 mg twice daily.    Current Facility-Administered Medications:  .  0.9 %  sodium chloride infusion, , Intravenous, Continuous, Jasmine Sails, MD, Last Rate: 20 mL/hr at 07/08/19 1319  Medications Prior to Admission  Medication Sig Dispense Refill Last Dose  . amLODipine (NORVASC) 5 MG tablet Take 5 mg by mouth daily.   11 07/08/2019 at 0700  . aspirin EC 81 MG tablet Take 1 tablet (81 mg total) by mouth daily. 150 tablet 2 Past Week at Unknown time  . clopidogrel (PLAVIX) 75 MG tablet Take 1 tablet (75 mg total) by mouth daily with breakfast. 30 tablet 11 Past Week at Unknown time  . iron polysaccharides (NU-IRON) 150 MG capsule Take 1 capsule by mouth 1 day or 1 dose.   Past Week at Unknown time  . isosorbide mononitrate (IMDUR) 30 MG 24 hr tablet Take 1 tablet by mouth 1 day or 1 dose.   Past Week at Unknown time  . meloxicam (MOBIC) 15 MG tablet Take 15 mg by mouth daily as needed for pain.   Past Week at Unknown time  . metoprolol succinate (TOPROL-XL) 25 MG 24 hr tablet Take 1 tablet by mouth 1 day or 1 dose.   07/08/2019 at 0700  . potassium chloride (K-DUR,KLOR-CON) 10 MEQ tablet Take 10 mEq by mouth 2 (two) times daily.   Past Week at Unknown time  . albuterol (VENTOLIN HFA) 108 (90 Base) MCG/ACT inhaler  Inhale 2 puffs into the lungs every 6 (six) hours as needed for wheezing or shortness of breath. (Patient not taking: Reported on 07/05/2019) 8 g 0 Not Taking at Unknown time  . gabapentin (NEURONTIN) 300 MG capsule Take 300 mg by mouth 3 (three) times daily.      . Omeprazole 20 MG TBEC Take 1 tablet by mouth 1 day or 1 dose.   Not Taking at Unknown time  . Polyethyl Glycol-Propyl Glycol (SYSTANE OP) Apply to eye 3 (three) times daily as needed.   Not Taking at Unknown time  . rosuvastatin (CRESTOR) 40 MG tablet Take 40 mg by mouth daily.         Allergies  Allergen Reactions  . Lisinopril Other (See Comments)    Other reaction(s): Angioedema Of tongue only  . Codeine Nausea And Vomiting     Past Medical History:  Diagnosis Date  . Carotid artery occlusion   . COPD (chronic obstructive pulmonary disease) ([place]) 07/06/2015  . Coronary artery disease   . Headache   . Hypertension   . Hyperthyroidism    ablated with iodine  . Peripheral vascular disease ([place])    Bilateral Carotid Artery Disease  . Personal history of arterial venous malformation (AVM)   . Pseudoclaudication  Review of systems:      Physical Exam    Heart and lungs: Regular rate and rhythm without rub or gallop lungs are bilaterally clear    HEENT: Normocephalic atraumatic eyes are anicteric    Other:    Pertinant exam for procedure: Soft nontender nondistended bowel sounds positive normoactive    Planned proceedures: EGD and indicated procedures. I have discussed the risks benefits and complications of procedures to include not limited to bleeding, infection, perforation and the risk of sedation and the patient wishes to proceed.    Jasmine Sails, MD Gastroenterology 07/08/2019  2:33 PM

## 2019-07-08 NOTE — Anesthesia Post-op Follow-up Note (Signed)
Anesthesia QCDR form completed.        

## 2019-07-08 NOTE — Op Note (Signed)
[facility] Gastroenterology Patient Name: Jasmine Acosta Procedure Date: 07/08/2019 2:36 PM MRN: [id number] Account #: [id number] Date of Birth: 25-May-1951 Admit Type: Outpatient Age: 68 Room: [facility] ENDO ROOM 1 Gender: Female Note Status: Finalized Procedure:            Upper GI endoscopy Indications:          Iron deficiency anemia due to suspected upper                        gastrointestinal bleeding, Melena Providers:            Lollie Sails, MD Referring MD:         Ocie Cornfield. Ouida Sills MD, MD (Referring MD) Medicines:            Monitored Anesthesia Care Complications:        No immediate complications. Procedure:            Pre-Anesthesia Assessment:                       - ASA Grade Assessment: III - A patient with severe                        systemic disease.                       After obtaining informed consent, the endoscope was                        passed under direct vision. Throughout the procedure,                        the patient's blood pressure, pulse, and oxygen                        saturations were monitored continuously. The Endoscope                        was introduced through the mouth, and advanced to the                        third part of duodenum. The upper GI endoscopy was                        accomplished without difficulty. The patient tolerated                        the procedure well. Findings:      The examined esophagus was normal.      The entire examined stomach was normal.      The cardia and gastric fundus were normal on retroflexion.      A single 1 mm angioectasia with typical arborization was found in the       duodenal bulb, this on the surface. Fulguration to ablate the lesion to       prevent bleeding by argon plasma at 0.5 liters/minute and 20 watts was       successful.      The exam of the duodenum was otherwise normal. Impression:           - Normal esophagus.                       -  Normal  stomach.                       - A single angioectasia in the duodenum. Treated with                        argon plasma coagulation (APC).                       - No specimens collected. Recommendation:       - Clear liquid diet for 1 day.                       - Full liquid diet for 2 days, then advance as                        tolerated to low residue diet for 3 days.                       - Use Prilosec (omeprazole) 40 mg PO BID for 1 month.                       - Use Prilosec (omeprazole) 40 mg PO daily.                       - Use sucralfate tablets 1 gram PO QID for 2 weeks.                       - Use sucralfate tablets 1 gram PO BID for 2 weeks. Procedure Code(s):    --- Professional ---                       [phone], Esophagogastroduodenoscopy, flexible, transoral;                        with control of bleeding, any method Diagnosis Code(s):    --- Professional ---                       [id number], Angiodysplasia of stomach and duodenum without                        bleeding                       D50.9, Iron deficiency anemia, unspecified                       K92.1, Melena (includes Hematochezia) CPT copyright 2019 [employer]. All rights reserved. The codes documented in this report are preliminary and upon coder review may  be revised to meet current compliance requirements. Lollie Sails, MD 07/08/2019 3:13:42 PM This report has been signed electronically. Number of Addenda: 0 Note Initiated On: 07/08/2019 2:36 PM      [facility]

## 2019-07-09 ENCOUNTER — Encounter: Payer: Self-pay | Admitting: Gastroenterology

## 2019-07-10 ENCOUNTER — Other Ambulatory Visit: Payer: Self-pay

## 2019-07-11 ENCOUNTER — Inpatient Hospital Stay: Payer: PPO

## 2019-07-11 ENCOUNTER — Other Ambulatory Visit: Payer: Self-pay

## 2019-07-11 VITALS — BP 120/74 | HR 74 | Temp 97.6°F | Resp 18

## 2019-07-11 DIAGNOSIS — D509 Iron deficiency anemia, unspecified: Secondary | ICD-10-CM

## 2019-07-11 MED ORDER — SODIUM CHLORIDE 0.9 % IV SOLN
510.0000 mg | Freq: Once | INTRAVENOUS | Status: AC
  Administered 2019-07-11: 14:00:00 510 mg via INTRAVENOUS
  Filled 2019-07-11: qty 17

## 2019-07-11 MED ORDER — SODIUM CHLORIDE 0.9 % IV SOLN
Freq: Once | INTRAVENOUS | Status: AC
  Administered 2019-07-11: 14:00:00 via INTRAVENOUS
  Filled 2019-07-11: qty 250

## 2019-07-11 NOTE — Anesthesia Postprocedure Evaluation (Signed)
Anesthesia Post Note  Patient: Jasmine Acosta  Procedure(s) Performed: ESOPHAGOGASTRODUODENOSCOPY (EGD) WITH PROPOFOL (N/A )  Patient location during evaluation: Endoscopy Anesthesia Type: General Level of consciousness: awake and alert Pain management: pain level controlled Vital Signs Assessment: post-procedure vital signs reviewed and stable Respiratory status: spontaneous breathing, nonlabored ventilation and respiratory function stable Cardiovascular status: blood pressure returned to baseline and stable Postop Assessment: no apparent nausea or vomiting Anesthetic complications: no     Last Vitals:  Vitals:   07/08/19 1523 07/08/19 1533  BP: 111/72   Pulse: 90   Resp: 14   Temp:    SpO2: 94% 98%    Last Pain:  Vitals:   07/09/19 0759  TempSrc:   PainSc: 0-No pain                 Alphonsus Sias

## 2019-07-16 DIAGNOSIS — Z01818 Encounter for other preprocedural examination: Secondary | ICD-10-CM | POA: Diagnosis not present

## 2019-07-16 DIAGNOSIS — I208 Other forms of angina pectoris: Secondary | ICD-10-CM | POA: Diagnosis not present

## 2019-07-16 DIAGNOSIS — I10 Essential (primary) hypertension: Secondary | ICD-10-CM | POA: Diagnosis not present

## 2019-07-16 DIAGNOSIS — R0602 Shortness of breath: Secondary | ICD-10-CM | POA: Diagnosis not present

## 2019-07-16 DIAGNOSIS — R011 Cardiac murmur, unspecified: Secondary | ICD-10-CM | POA: Diagnosis not present

## 2019-07-16 DIAGNOSIS — I779 Disorder of arteries and arterioles, unspecified: Secondary | ICD-10-CM | POA: Diagnosis not present

## 2019-07-16 DIAGNOSIS — I70219 Atherosclerosis of native arteries of extremities with intermittent claudication, unspecified extremity: Secondary | ICD-10-CM | POA: Diagnosis not present

## 2019-07-16 DIAGNOSIS — G43709 Chronic migraine without aura, not intractable, without status migrainosus: Secondary | ICD-10-CM | POA: Diagnosis not present

## 2019-07-16 DIAGNOSIS — J449 Chronic obstructive pulmonary disease, unspecified: Secondary | ICD-10-CM | POA: Diagnosis not present

## 2019-07-16 DIAGNOSIS — Z87891 Personal history of nicotine dependence: Secondary | ICD-10-CM | POA: Diagnosis not present

## 2019-07-16 DIAGNOSIS — I739 Peripheral vascular disease, unspecified: Secondary | ICD-10-CM | POA: Diagnosis not present

## 2019-07-30 DIAGNOSIS — D509 Iron deficiency anemia, unspecified: Secondary | ICD-10-CM | POA: Diagnosis not present

## 2019-07-30 DIAGNOSIS — K5900 Constipation, unspecified: Secondary | ICD-10-CM | POA: Diagnosis not present

## 2019-07-30 DIAGNOSIS — Z8774 Personal history of (corrected) congenital malformations of heart and circulatory system: Secondary | ICD-10-CM | POA: Diagnosis not present

## 2019-07-30 DIAGNOSIS — Z800 Family history of malignant neoplasm of digestive organs: Secondary | ICD-10-CM | POA: Diagnosis not present

## 2019-07-30 DIAGNOSIS — Z86010 Personal history of colonic polyps: Secondary | ICD-10-CM | POA: Diagnosis not present

## 2019-08-30 ENCOUNTER — Encounter: Payer: Self-pay | Admitting: Oncology

## 2019-08-30 ENCOUNTER — Other Ambulatory Visit: Payer: Self-pay

## 2019-08-30 NOTE — Progress Notes (Signed)
Patient pre screened for office appointment, no questions or concerns today. 

## 2019-08-30 NOTE — Progress Notes (Signed)
[place]  [phone] [phone]) [phone]  ID: Jasmine Acosta OB: 1951/03/07  MR#: [id number]  [id number]  Patient Care Team: Kirk Ruths, MD as PCP - General (Internal Medicine)  CHIEF COMPLAINT: Iron deficiency anemia.  INTERVAL HISTORY: Patient returns to clinic today for repeat laboratory work, further evaluation, and consideration of additional Feraheme.  She feels significantly improved after receiving treatment several months ago.  She does not complain of weakness or fatigue today. She has no neurologic complaints.  She denies any recent fevers or illnesses.  She has a good appetite and denies weight loss.  She has no chest pain, shortness of breath, cough, or hemoptysis.  She denies any nausea, vomiting, constipation, or diarrhea.  She has no melena or hematochezia.  She has no urinary complaints.  Patient offers no specific complaints today.    REVIEW OF SYSTEMS:   Review of Systems  Constitutional: Negative.  Negative for fever, malaise/fatigue and weight loss.  Respiratory: Negative.  Negative for cough, hemoptysis and shortness of breath.   Cardiovascular: Negative.  Negative for chest pain and leg swelling.  Gastrointestinal: Negative.  Negative for abdominal pain, blood in stool and melena.  Genitourinary: Negative.  Negative for hematuria.  Musculoskeletal: Negative.  Negative for back pain.  Skin: Negative.  Negative for rash.  Neurological: Negative.  Negative for dizziness, focal weakness, weakness and headaches.  Psychiatric/Behavioral: Negative.  The patient is not nervous/anxious.     As per HPI. Otherwise, a complete review of systems is negative.  PAST MEDICAL HISTORY: Past Medical History:  Diagnosis Date  . Carotid artery occlusion   . COPD (chronic obstructive pulmonary disease) ([place]) 07/06/2015  . Coronary artery disease   . Headache   . Hypertension   . Hyperthyroidism    ablated with iodine  . Peripheral  vascular disease ([place])    Bilateral Carotid Artery Disease  . Personal history of arterial venous malformation (AVM)   . Pseudoclaudication     PAST SURGICAL HISTORY: Past Surgical History:  Procedure Laterality Date  . ABDOMINAL HYSTERECTOMY     heavy bleeding  . CATARACT EXTRACTION W/PHACO Right 10/09/2018   Procedure: CATARACT EXTRACTION PHACO AND INTRAOCULAR LENS PLACEMENT ([place]) RIGHT;  Surgeon: Leandrew Koyanagi, MD;  Location: [place];  Service: Ophthalmology;  Laterality: Right;  . CATARACT EXTRACTION W/PHACO Left 01/01/2019   Procedure: CATARACT EXTRACTION PHACO AND INTRAOCULAR LENS PLACEMENT ([place])  LEFT;  Surgeon: Leandrew Koyanagi, MD;  Location: [place];  Service: Ophthalmology;  Laterality: Left;  . CORONARY ANGIOPLASTY    . ENDARTERECTOMY Right 06/14/2018   Procedure: ENDARTERECTOMY CAROTID;  Surgeon: Algernon Huxley, MD;  Location: [facility];  Service: Vascular;  Laterality: Right;  . ESOPHAGOGASTRODUODENOSCOPY N/A 07/05/2019   Procedure: ESOPHAGOGASTRODUODENOSCOPY (EGD);  Surgeon: Lollie Sails, MD;  Location[facility] END[facility]COPY;  Service: Endoscopy;  Laterality: N/A;  . ESOPHAGOGASTRODUODENOSCOPY (EGD) WITH PROPOFOL N/A 07/08/2019   Procedure: ESOPHAGOGASTRODUODENOSCOPY (EGD) WITH PROPOFOL;  Surgeon: Lollie Sails, MD;  Location: [facility] ENDOSCOPY;  Service: Endoscopy;  Laterality: N/A;  . EYE SURGERY    . hallux vagus correction Right   . HALLUX VALGUS AUSTIN Left 07/01/2016   Procedure: HALLUX VALGUS AUSTIN left foot;  Surgeon: Sharlotte Alamo, DPM;  Location: [facility];  Service: Podiatry;  Laterality: Left;  . LOWER EXTREMITY ANGIOGRAPHY Right 11/05/2018   Procedure: LOWER EXTREMITY ANGIOGRAPHY;  Surgeon: Algernon Huxley, MD;  Location: [place] [facility] LAB;  Service: Cardiovascular;  Laterality: Right;    FAMILY HISTORY: Family  History  Problem Relation Age of Onset  . Diabetes Mother   . Emphysema Father   . Heart disease Sister   . Heart disease  Brother   . Breast cancer Neg Hx   . Ovarian cancer Neg Hx   . Colon cancer Neg Hx     ADVANCED DIRECTIVES (Y/N):  N  HEALTH MAINTENANCE: Social History   Tobacco Use  . Smoking status: Former Smoker    Packs/day: 0.25    Years: 48.00    Pack years: 12.00    Types: Cigarettes    Quit date: 06/14/2018    Years since quitting: 1.2  . Smokeless tobacco: Never Used  . Tobacco comment: since age 1  Substance Use Topics  . Alcohol use: No  . Drug use: No     Colonoscopy:  PAP:  Bone density:  Lipid panel:  Allergies  Allergen Reactions  . Lisinopril Other (See Comments)    Other reaction(s): Angioedema Of tongue only  . Codeine Nausea And Vomiting    Current Outpatient Medications  Medication Sig Dispense Refill  . albuterol (VENTOLIN HFA) 108 (90 Base) MCG/ACT inhaler Inhale 2 puffs into the lungs every 6 (six) hours as needed for wheezing or shortness of breath. 8 g 0  . amLODipine (NORVASC) 5 MG tablet Take 5 mg by mouth daily.   11  . aspirin EC 81 MG tablet Take 1 tablet (81 mg total) by mouth daily. 150 tablet 2  . clopidogrel (PLAVIX) 75 MG tablet Take 1 tablet (75 mg total) by mouth daily with breakfast. 30 tablet 11  . iron polysaccharides (NU-IRON) 150 MG capsule Take 1 capsule by mouth 1 day or 1 dose.    . isosorbide mononitrate (IMDUR) 30 MG 24 hr tablet Take 1 tablet by mouth 1 day or 1 dose.    . meloxicam (MOBIC) 15 MG tablet Take 15 mg by mouth daily as needed for pain.    . metoprolol succinate (TOPROL-XL) 25 MG 24 hr tablet Take 1 tablet by mouth 1 day or 1 dose.    Marland Kitchen Omeprazole 20 MG TBEC Take 1 tablet by mouth 1 day or 1 dose.    Vladimir Faster Glycol-Propyl Glycol (SYSTANE OP) Apply to eye 3 (three) times daily as needed.    . potassium chloride (K-DUR,KLOR-CON) 10 MEQ tablet Take 10 mEq by mouth 2 (two) times daily.    . rosuvastatin (CRESTOR) 40 MG tablet Take 40 mg by mouth daily.      No current facility-administered medications for this visit.      OBJECTIVE: Vitals:   09/02/19 1323  BP: (!) 144/75  Pulse: 85  Temp: (!) 97 F (36.1 C)  SpO2: 100%     Body mass index is 24.83 kg/m.    ECOG FS:1 - Symptomatic but completely ambulatory  General: Well-developed, well-nourished, no acute distress. Eyes: Pink conjunctiva, anicteric sclera. HEENT: Normocephalic, moist mucous membranes. Lungs: Clear to auscultation bilaterally. Heart: Regular rate and rhythm. No rubs, murmurs, or gallops. Abdomen: Soft, nontender, nondistended. No organomegaly noted, normoactive bowel sounds. Musculoskeletal: No edema, cyanosis, or clubbing. Neuro: Alert, answering all questions appropriately. Cranial nerves grossly intact. Skin: No rashes or petechiae noted. Psych: Normal affect.  LAB RESULTS:  Lab Results  Component Value Date   NA 137 06/11/2019   K 3.2 (L) 06/11/2019   CL 105 06/11/2019   CO2 23 06/11/2019   GLUCOSE 110 (H) 06/11/2019   BUN 9 06/11/2019   CREATININE 0.65 06/11/2019   CALCIUM  9.2 06/11/2019   PROT 7.7 06/11/2019   ALBUMIN 4.2 06/11/2019   AST 18 06/11/2019   ALT 17 06/11/2019   ALKPHOS 67 06/11/2019   BILITOT 0.6 06/11/2019   GFRNONAA >60 06/11/2019   GFRAA >60 06/11/2019    Lab Results  Component Value Date   WBC 6.4 09/02/2019   NEUTROABS 3.9 09/02/2019   HGB 10.5 (L) 09/02/2019   HCT 34.1 (L) 09/02/2019   MCV 90.9 09/02/2019   PLT 199 09/02/2019     STUDIES: No results found.  ASSESSMENT: Iron deficiency anemia.  PLAN:    1.  Iron deficiency anemia: Patient's hemoglobin has significantly improved to 10.5.  Iron stores are pending at time of dictation.  Patient had upper endoscopy on July 08, 2019 that revealed 1 angiectasia in the duodenum that was treated with argon plasma coagulation.  She reports she has a colonoscopy scheduled for October 28, 2019.  No intervention is needed at this time.  Return to clinic in 3 months with repeat laboratory work, further evaluation, and consideration of  additional treatment.    I spent a total of 15 minutes face-to-face with the patient of which greater than 50% of the visit was spent in counseling and coordination of care as detailed above.  Patient expressed understanding and was in agreement with this plan. She also understands that She can call clinic at any time with any questions, concerns, or complaints.    Lloyd Huger, MD   09/02/2019 1:50 PM

## 2019-09-02 ENCOUNTER — Inpatient Hospital Stay: Payer: PPO

## 2019-09-02 ENCOUNTER — Inpatient Hospital Stay: Payer: PPO | Attending: Oncology

## 2019-09-02 ENCOUNTER — Other Ambulatory Visit: Payer: Self-pay

## 2019-09-02 ENCOUNTER — Inpatient Hospital Stay (HOSPITAL_BASED_OUTPATIENT_CLINIC_OR_DEPARTMENT_OTHER): Payer: PPO | Admitting: Oncology

## 2019-09-02 VITALS — BP 144/75 | HR 85 | Temp 97.0°F | Wt 131.4 lb

## 2019-09-02 DIAGNOSIS — D509 Iron deficiency anemia, unspecified: Secondary | ICD-10-CM | POA: Diagnosis not present

## 2019-09-02 LAB — IRON AND TIBC
Iron: 42 ug/dL (ref 28–170)
Saturation Ratios: 12 % (ref 10.4–31.8)
TIBC: 348 ug/dL (ref 250–450)
UIBC: 306 ug/dL

## 2019-09-02 LAB — CBC WITH DIFFERENTIAL/PLATELET
Abs Immature Granulocytes: 0.01 10*3/uL (ref 0.00–0.07)
Basophils Absolute: 0 10*3/uL (ref 0.0–0.1)
Basophils Relative: 1 %
Eosinophils Absolute: 0.1 10*3/uL (ref 0.0–0.5)
Eosinophils Relative: 2 %
HCT: 34.1 % — ABNORMAL LOW (ref 36.0–46.0)
Hemoglobin: 10.5 g/dL — ABNORMAL LOW (ref 12.0–15.0)
Immature Granulocytes: 0 %
Lymphocytes Relative: 29 %
Lymphs Abs: 1.8 10*3/uL (ref 0.7–4.0)
MCH: 28 pg (ref 26.0–34.0)
MCHC: 30.8 g/dL (ref 30.0–36.0)
MCV: 90.9 fL (ref 80.0–100.0)
Monocytes Absolute: 0.6 10*3/uL (ref 0.1–1.0)
Monocytes Relative: 9 %
Neutro Abs: 3.9 10*3/uL (ref 1.7–7.7)
Neutrophils Relative %: 59 %
Platelets: 199 10*3/uL (ref 150–400)
RBC: 3.75 MIL/uL — ABNORMAL LOW (ref 3.87–5.11)
RDW: 17.8 % — ABNORMAL HIGH (ref 11.5–15.5)
WBC: 6.4 10*3/uL (ref 4.0–10.5)
nRBC: 0 % (ref 0.0–0.2)

## 2019-09-02 LAB — FERRITIN: Ferritin: 17 ng/mL (ref 11–307)

## 2019-10-15 ENCOUNTER — Encounter (INDEPENDENT_AMBULATORY_CARE_PROVIDER_SITE_OTHER): Payer: PPO

## 2019-10-15 ENCOUNTER — Ambulatory Visit (INDEPENDENT_AMBULATORY_CARE_PROVIDER_SITE_OTHER): Payer: PPO | Admitting: Vascular Surgery

## 2019-10-24 ENCOUNTER — Other Ambulatory Visit
Admission: RE | Admit: 2019-10-24 | Discharge: 2019-10-24 | Disposition: A | Discharge status: Home / Self Care | Payer: PPO | Source: Ambulatory Visit | Attending: Internal Medicine | Admitting: Internal Medicine

## 2019-10-24 ENCOUNTER — Other Ambulatory Visit: Payer: Self-pay

## 2019-10-24 DIAGNOSIS — Z20828 Contact with and (suspected) exposure to other viral communicable diseases: Secondary | ICD-10-CM | POA: Diagnosis not present

## 2019-10-24 DIAGNOSIS — Z01812 Encounter for preprocedural laboratory examination: Secondary | ICD-10-CM | POA: Insufficient documentation

## 2019-10-25 LAB — SARS CORONAVIRUS 2 (TAT 6-24 HRS): SARS Coronavirus 2: NEGATIVE

## 2019-10-28 ENCOUNTER — Other Ambulatory Visit: Admission: RE | Admit: 2019-10-28 | Payer: PPO | Source: Ambulatory Visit

## 2019-10-29 ENCOUNTER — Encounter: Payer: Self-pay | Admitting: Internal Medicine

## 2019-10-30 ENCOUNTER — Ambulatory Visit: Payer: PPO | Admitting: Anesthesiology

## 2019-10-30 ENCOUNTER — Encounter
Admission: RE | Disposition: A | Discharge status: Home / Self Care | Payer: Self-pay | Source: Home / Self Care | Attending: Internal Medicine

## 2019-10-30 ENCOUNTER — Ambulatory Visit
Admission: RE | Admit: 2019-10-30 | Discharge: 2019-10-30 | Disposition: A | Discharge status: Home / Self Care | Payer: PPO | Attending: Internal Medicine | Admitting: Internal Medicine

## 2019-10-30 ENCOUNTER — Encounter: Payer: Self-pay | Admitting: Internal Medicine

## 2019-10-30 DIAGNOSIS — Z800 Family history of malignant neoplasm of digestive organs: Secondary | ICD-10-CM | POA: Diagnosis not present

## 2019-10-30 DIAGNOSIS — Z87891 Personal history of nicotine dependence: Secondary | ICD-10-CM | POA: Insufficient documentation

## 2019-10-30 DIAGNOSIS — Z86010 Personal history of colonic polyps: Secondary | ICD-10-CM | POA: Diagnosis not present

## 2019-10-30 DIAGNOSIS — Z8719 Personal history of other diseases of the digestive system: Secondary | ICD-10-CM | POA: Insufficient documentation

## 2019-10-30 DIAGNOSIS — Z885 Allergy status to narcotic agent status: Secondary | ICD-10-CM | POA: Insufficient documentation

## 2019-10-30 DIAGNOSIS — D500 Iron deficiency anemia secondary to blood loss (chronic): Secondary | ICD-10-CM | POA: Diagnosis not present

## 2019-10-30 DIAGNOSIS — I10 Essential (primary) hypertension: Secondary | ICD-10-CM | POA: Diagnosis not present

## 2019-10-30 DIAGNOSIS — Z1211 Encounter for screening for malignant neoplasm of colon: Secondary | ICD-10-CM | POA: Diagnosis not present

## 2019-10-30 DIAGNOSIS — D125 Benign neoplasm of sigmoid colon: Secondary | ICD-10-CM | POA: Diagnosis not present

## 2019-10-30 DIAGNOSIS — I739 Peripheral vascular disease, unspecified: Secondary | ICD-10-CM | POA: Insufficient documentation

## 2019-10-30 DIAGNOSIS — I2510 Atherosclerotic heart disease of native coronary artery without angina pectoris: Secondary | ICD-10-CM | POA: Diagnosis not present

## 2019-10-30 DIAGNOSIS — Z888 Allergy status to other drugs, medicaments and biological substances status: Secondary | ICD-10-CM | POA: Insufficient documentation

## 2019-10-30 DIAGNOSIS — Z79899 Other long term (current) drug therapy: Secondary | ICD-10-CM | POA: Diagnosis not present

## 2019-10-30 DIAGNOSIS — K640 First degree hemorrhoids: Secondary | ICD-10-CM | POA: Insufficient documentation

## 2019-10-30 DIAGNOSIS — Z7902 Long term (current) use of antithrombotics/antiplatelets: Secondary | ICD-10-CM | POA: Diagnosis not present

## 2019-10-30 DIAGNOSIS — Z7982 Long term (current) use of aspirin: Secondary | ICD-10-CM | POA: Insufficient documentation

## 2019-10-30 DIAGNOSIS — K635 Polyp of colon: Secondary | ICD-10-CM | POA: Diagnosis not present

## 2019-10-30 DIAGNOSIS — J449 Chronic obstructive pulmonary disease, unspecified: Secondary | ICD-10-CM | POA: Insufficient documentation

## 2019-10-30 DIAGNOSIS — K649 Unspecified hemorrhoids: Secondary | ICD-10-CM | POA: Diagnosis not present

## 2019-10-30 DIAGNOSIS — Q282 Arteriovenous malformation of cerebral vessels: Secondary | ICD-10-CM | POA: Diagnosis not present

## 2019-10-30 HISTORY — PX: COLONOSCOPY WITH PROPOFOL: SHX5780

## 2019-10-30 SURGERY — COLONOSCOPY WITH PROPOFOL
Anesthesia: General

## 2019-10-30 MED ORDER — SODIUM CHLORIDE 0.9 % IV SOLN: INTRAVENOUS | Status: DC

## 2019-10-30 MED ORDER — PHENYLEPHRINE HCL (PRESSORS) 10 MG/ML IV SOLN
INTRAVENOUS | Status: DC | PRN
  Administered 2019-10-30: 50 ug via INTRAVENOUS

## 2019-10-30 MED ORDER — PROPOFOL 500 MG/50ML IV EMUL
INTRAVENOUS | Status: DC | PRN
  Administered 2019-10-30: 140 ug/kg/min via INTRAVENOUS

## 2019-10-30 MED ORDER — PROPOFOL 10 MG/ML IV BOLUS
INTRAVENOUS | Status: DC | PRN
  Administered 2019-10-30: 50 mg via INTRAVENOUS

## 2019-10-30 MED ORDER — PROPOFOL 500 MG/50ML IV EMUL
INTRAVENOUS | Status: AC
  Filled 2019-10-30: qty 50

## 2019-10-30 NOTE — Anesthesia Preprocedure Evaluation (Signed)
Anesthesia Evaluation  Patient identified by MRN, date of birth, ID band Patient awake    Reviewed: Allergy & Precautions, NPO status , Patient's Chart, lab work & pertinent test results  History of Anesthesia Complications Negative for: history of anesthetic complications  Airway Mallampati: I  TM Distance: >3 FB Neck ROM: Full    Dental  (+) Partial Lower, Edentulous Upper   Pulmonary COPD, Patient abstained from smoking.Not current smoker, former smoker,  Pt says doctor says she has COPD but she does not feel SOB or take medicines for it   Pulmonary exam normal breath sounds clear to auscultation       Cardiovascular Exercise Tolerance: Good METShypertension, Pt. on medications + CAD and + Peripheral Vascular Disease   Rhythm:Regular Rate:Normal - Systolic murmurs    Neuro/Psych negative neurological ROS  negative psych ROS   GI/Hepatic Neg liver ROS, neg GERD  ,  Endo/Other  neg diabetesHyperthyroidism   Renal/GU negative Renal ROS     Musculoskeletal   Abdominal   Peds  Hematology   Anesthesia Other Findings   Reproductive/Obstetrics                            Anesthesia Physical Anesthesia Plan  ASA: III  Anesthesia Plan: General   Post-op Pain Management:    Induction: Intravenous  PONV Risk Score and Plan: 3 and Propofol infusion and TIVA  Airway Management Planned: Nasal Cannula  Additional Equipment: None  Intra-op Plan:   Post-operative Plan:   Informed Consent: I have reviewed the patients History and Physical, chart, labs and discussed the procedure including the risks, benefits and alternatives for the proposed anesthesia with the patient or authorized representative who has indicated his/her understanding and acceptance.       Plan Discussed with: CRNA and Surgeon  Anesthesia Plan Comments: (Discussed risks of anesthesia with patient, including  possibility of difficulty with spontaneous ventilation under anesthesia necessitating airway intervention, PONV, and rare risks such as cardiac or respiratory events. Patient understands.)        Anesthesia Quick Evaluation

## 2019-10-30 NOTE — Interval H&P Note (Signed)
History and Physical Interval Note:  10/30/2019 11:14 AM  Jasmine Acosta  has presented today for surgery, with the diagnosis of ida avm hx of polyps.  The various methods of treatment have been discussed with the patient and family. After consideration of risks, benefits and other options for treatment, the patient has consented to  Procedure(s): COLONOSCOPY WITH PROPOFOL (N/A) as a surgical intervention.  The patient's history has been reviewed, patient examined, no change in status, stable for surgery.  I have reviewed the patient's chart and labs.  Questions were answered to the patient's satisfaction.     [place], [place]

## 2019-10-30 NOTE — Transfer of Care (Signed)
Immediate Anesthesia Transfer of Care Note  Patient: Jasmine Acosta  Procedure(s) Performed: COLONOSCOPY WITH PROPOFOL (N/A )  Patient Location: PACU  Anesthesia Type:General  Level of Consciousness: awake and alert   Airway & Oxygen Therapy: Patient Spontanous Breathing and Patient connected to nasal cannula oxygen  Post-op Assessment: Report given to RN and Post -op Vital signs reviewed and stable  Post vital signs: Reviewed and stable  Last Vitals:  Vitals Value Taken Time  BP 84/55 10/30/19 1145  Temp 36 C 10/30/19 1145  Pulse 79 10/30/19 1146  Resp 19 10/30/19 1146  SpO2 98 % 10/30/19 1146  Vitals shown include unvalidated device data.  Last Pain:  Vitals:   10/30/19 1145  TempSrc: Temporal  PainSc: 0-No pain         Complications: No apparent anesthesia complications

## 2019-10-30 NOTE — Anesthesia Postprocedure Evaluation (Signed)
Anesthesia Post Note  Patient: Jasmine Acosta  Procedure(s) Performed: COLONOSCOPY WITH PROPOFOL (N/A )  Patient location during evaluation: PACU Anesthesia Type: General Level of consciousness: awake and alert Pain management: pain level controlled Vital Signs Assessment: post-procedure vital signs reviewed and stable Respiratory status: spontaneous breathing, nonlabored ventilation, respiratory function stable and patient connected to nasal cannula oxygen Cardiovascular status: blood pressure returned to baseline and stable Postop Assessment: no apparent nausea or vomiting Anesthetic complications: no     Last Vitals:  Vitals:   10/30/19 1145 10/30/19 1155  BP: (!) 84/55 90/61  Pulse: 81 70  Resp: 16 16  Temp: (!) 36 C   SpO2: 99% 97%    Last Pain:  Vitals:   10/30/19 1155  TempSrc:   PainSc: 0-No pain                 Arita Miss

## 2019-10-30 NOTE — H&P (Signed)
Outpatient short stay form Pre-procedure 10/30/2019 11:12 AM Inara Dike K. Alice Reichert, M.D.  Primary Physician: Frazier Richards, MD  Reason for visit: Iron deficiency anemia, personal history of colon polyps, family history of colon cancer.  History of present illness: Pleasant 69 year old female presents with iron deficiency considered secondary to chronic blood loss.  Patient has a history of duodenal vascular ectasia cauterized in September 2020.  There has been no complaint of gross rectal bleeding such as hematochezia or melena.  There is no weight loss.  Patient takes Plavix on a daily basis but has held this for the past 5 days prior to her procedure.    Current Facility-Administered Medications:  .  0.9 %  sodium chloride infusion, , Intravenous, Continuous, Shiner, Benay Pike, MD, Last Rate: 20 mL/hr at 10/30/19 1022, New Bag at 10/30/19 1022  Medications Prior to Admission  Medication Sig Dispense Refill Last Dose  . amLODipine (NORVASC) 5 MG tablet Take 5 mg by mouth daily.   11 10/30/2019 at 0600  . aspirin EC 81 MG tablet Take 1 tablet (81 mg total) by mouth daily. 150 tablet 2 10/29/2019 at Unknown time  . iron polysaccharides (NU-IRON) 150 MG capsule Take 1 capsule by mouth 1 day or 1 dose.   Past Week at Unknown time  . potassium chloride (K-DUR,KLOR-CON) 10 MEQ tablet Take 10 mEq by mouth 2 (two) times daily.   10/29/2019 at Unknown time  . albuterol (VENTOLIN HFA) 108 (90 Base) MCG/ACT inhaler Inhale 2 puffs into the lungs every 6 (six) hours as needed for wheezing or shortness of breath. 8 g 0   . clopidogrel (PLAVIX) 75 MG tablet Take 1 tablet (75 mg total) by mouth daily with breakfast. 30 tablet 11 10/24/2019  . Omeprazole 20 MG TBEC Take 1 tablet by mouth 1 day or 1 dose.     Vladimir Faster Glycol-Propyl Glycol (SYSTANE OP) Apply to eye 3 (three) times daily as needed.        Allergies  Allergen Reactions  . Lisinopril Other (See Comments)    Other reaction(s): Angioedema Of  tongue only  . Codeine Nausea And Vomiting     Past Medical History:  Diagnosis Date  . Carotid artery occlusion   . COPD (chronic obstructive pulmonary disease) ([place]) 07/06/2015  . Coronary artery disease   . Headache   . Hypertension   . Hyperthyroidism    ablated with iodine  . Peripheral vascular disease ([place])    Bilateral Carotid Artery Disease  . Personal history of arterial venous malformation (AVM)   . Pseudoclaudication     Review of systems:  Otherwise negative.    Physical Exam  Gen: Alert, oriented. Appears stated age.  HEENT: [place]/AT. PERRLA. Lungs: CTA, no wheezes. CV: RR nl S1, S2. Abd: soft, benign, no masses. BS+ Ext: No edema. Pulses 2+    Planned procedures: Proceed with colonoscopy. The patient understands the nature of the planned procedure, indications, risks, alternatives and potential complications including but not limited to bleeding, infection, perforation, damage to internal organs and possible oversedation/side effects from anesthesia. The patient agrees and gives consent to proceed.  Please refer to procedure notes for findings, recommendations and patient disposition/instructions.     Orma Cheetham K. Alice Reichert, M.D. Gastroenterology 10/30/2019  11:12 AM

## 2019-10-30 NOTE — Op Note (Signed)
[facility] Gastroenterology Patient Name: Jasmine Acosta Procedure Date: 10/30/2019 11:12 AM MRN: [id number] Account #: [id number] Date of Birth: 08/28/1951 Admit Type: Outpatient Age: 69 Room: [facility] ENDO ROOM 3 Gender: Female Note Status: Finalized Procedure:             Colonoscopy Indications:           Iron deficiency anemia secondary to chronic blood loss Providers:             Benay Pike. Alice Reichert MD, MD Referring MD:          Ocie Cornfield. Ouida Sills MD, MD (Referring MD) Medicines:             Propofol per Anesthesia Complications:         No immediate complications. Procedure:             Pre-Anesthesia Assessment:                        - The risks and benefits of the procedure and the                         sedation options and risks were discussed with the                         patient. All questions were answered and informed                         consent was obtained.                        - Patient identification and proposed procedure were                         verified prior to the procedure by the nurse. The                         procedure was verified in the procedure room.                        - ASA Grade Assessment: III - A patient with severe                         systemic disease.                        - After reviewing the risks and benefits, the patient                         was deemed in satisfactory condition to undergo the                         procedure.                        After obtaining informed consent, the colonoscope was                         passed under direct vision. Throughout the procedure,  the patient's blood pressure, pulse, and oxygen                         saturations were monitored continuously. The                         Colonoscope was introduced through the anus and                         advanced to the the cecum, identified by appendiceal                         orifice and  ileocecal valve. The colonoscopy was                         performed without difficulty. The patient tolerated                         the procedure well. The quality of the bowel                         preparation was excellent. The ileocecal valve,                         appendiceal orifice, and rectum were photographed. Findings:      The perianal and digital rectal examinations were normal. Pertinent       negatives include normal sphincter tone and no palpable rectal lesions.      Non-bleeding internal hemorrhoids were found during retroflexion. The       hemorrhoids were Grade I (internal hemorrhoids that do not prolapse).      A 7 mm polyp was found in the sigmoid colon. The polyp was       semi-pedunculated. The polyp was removed with a cold snare. Resection       and retrieval were complete.      The exam was otherwise without abnormality on direct and retroflexion       views. Impression:            - Non-bleeding internal hemorrhoids.                        - One 7 mm polyp in the sigmoid colon, removed with a                         cold snare. Resected and retrieved.                        - The examination was otherwise normal on direct and                         retroflexion views. Recommendation:        - Patient has a contact number available for                         emergencies. The signs and symptoms of potential                         delayed complications were discussed with the patient.  Return to normal activities tomorrow. Written                         discharge instructions were provided to the patient.                        - Resume previous diet.                        - Continue present medications.                        - Await pathology results.                        - Repeat colonoscopy in 5 years for surveillance.                        - To visualize the small bowel, perform video capsule                          endoscopy at appointment to be scheduled.                        - Return to physician assistant in 6 weeks.                        - Please follow up with Tammi Klippel, PA-C in [ ]                          months. Procedure Code(s):     --- Professional ---                        [phone], Colonoscopy, flexible; with removal of                         tumor(s), polyp(s), or other lesion(s) by snare                         technique Diagnosis Code(s):     --- Professional ---                        D50.0, Iron deficiency anemia secondary to blood loss                         (chronic)                        K64.0, First degree hemorrhoids                        K63.5, Polyp of colon CPT copyright 2019 [employer] Medical [employer]. All rights reserved. The codes documented in this report are preliminary and upon coder review may  be revised to meet current compliance requirements. Efrain Sella MD, MD 10/30/2019 11:44:00 AM This report has been signed electronically. Number of Addenda: 0 Note Initiated On: 10/30/2019 11:12 AM Scope Withdrawal Time: 0 hours 6 minutes 7 seconds  Total Procedure Duration: 0 hours 10 minutes 4 seconds  Estimated Blood Loss:  Estimated blood loss: none.  [facility]

## 2019-10-31 LAB — SURGICAL PATHOLOGY

## 2019-11-19 ENCOUNTER — Other Ambulatory Visit: Payer: Self-pay

## 2019-11-19 ENCOUNTER — Ambulatory Visit (INDEPENDENT_AMBULATORY_CARE_PROVIDER_SITE_OTHER): Payer: PPO | Admitting: Vascular Surgery

## 2019-11-19 ENCOUNTER — Ambulatory Visit (INDEPENDENT_AMBULATORY_CARE_PROVIDER_SITE_OTHER): Payer: PPO

## 2019-11-19 ENCOUNTER — Encounter (INDEPENDENT_AMBULATORY_CARE_PROVIDER_SITE_OTHER): Payer: Self-pay | Admitting: Vascular Surgery

## 2019-11-19 VITALS — BP 153/78 | HR 75 | Resp 18 | Ht 61.0 in | Wt 131.0 lb

## 2019-11-19 DIAGNOSIS — I10 Essential (primary) hypertension: Secondary | ICD-10-CM

## 2019-11-19 DIAGNOSIS — I70213 Atherosclerosis of native arteries of extremities with intermittent claudication, bilateral legs: Secondary | ICD-10-CM

## 2019-11-19 DIAGNOSIS — I6523 Occlusion and stenosis of bilateral carotid arteries: Secondary | ICD-10-CM | POA: Diagnosis not present

## 2019-11-19 DIAGNOSIS — E785 Hyperlipidemia, unspecified: Secondary | ICD-10-CM

## 2019-11-19 NOTE — Assessment & Plan Note (Signed)
Had intervention last year and symptoms are doing much better.  To be checked later this year

## 2019-11-19 NOTE — Assessment & Plan Note (Signed)
Carotid duplex today shows a patent right carotid endarterectomy and mild 1 to 39% left ICA stenosis. We will continue to follow this on an annual basis.

## 2019-11-19 NOTE — Progress Notes (Signed)
MRN : [id number]  Jasmine Acosta is a 69 y.o. (1951-08-18) female who presents with chief complaint of  Chief Complaint  Patient presents with  . Follow-up  .  History of Present Illness: Patient returns today in follow-up of her carotid disease.  She is about a year and a half status post right carotid endarterectomy and doing well.  No specific complaints today.  Carotid duplex today shows a patent right carotid endarterectomy and mild 1 to 39% left ICA stenosis. She is also about a year status post intervention for PAD.  Her leg pain is much better after intervention although her legs do still tire with activity.  No new rest pain or ulceration.  She is scheduled to have her noninvasive studies performed later this year for her PAD.  Current Outpatient Medications  Medication Sig Dispense Refill  . amLODipine (NORVASC) 5 MG tablet Take 5 mg by mouth daily.   11  . aspirin EC 81 MG tablet Take 1 tablet (81 mg total) by mouth daily. 150 tablet 2  . clopidogrel (PLAVIX) 75 MG tablet Take 1 tablet (75 mg total) by mouth daily with breakfast. 30 tablet 11  . Multiple Vitamin (MULTIVITAMIN) capsule Take 1 capsule by mouth daily.    . Omeprazole 20 MG TBEC Take 1 tablet by mouth 1 day or 1 dose.    . albuterol (VENTOLIN HFA) 108 (90 Base) MCG/ACT inhaler Inhale 2 puffs into the lungs every 6 (six) hours as needed for wheezing or shortness of breath. (Patient not taking: Reported on 11/19/2019) 8 g 0  . iron polysaccharides (NU-IRON) 150 MG capsule Take 1 capsule by mouth 1 day or 1 dose.    Vladimir Faster Glycol-Propyl Glycol (SYSTANE OP) Apply to eye 3 (three) times daily as needed.    . potassium chloride (K-DUR,KLOR-CON) 10 MEQ tablet Take 10 mEq by mouth 2 (two) times daily.     No current facility-administered medications for this visit.    Past Medical History:  Diagnosis Date  . Carotid artery occlusion   . COPD (chronic obstructive pulmonary disease) ([place]) 07/06/2015  . Coronary artery  disease   . Headache   . Hypertension   . Hyperthyroidism    ablated with iodine  . Peripheral vascular disease ([place])    Bilateral Carotid Artery Disease  . Personal history of arterial venous malformation (AVM)   . Pseudoclaudication     Past Surgical History:  Procedure Laterality Date  . ABDOMINAL HYSTERECTOMY     heavy bleeding  . CATARACT EXTRACTION W/PHACO Right 10/09/2018   Procedure: CATARACT EXTRACTION PHACO AND INTRAOCULAR LENS PLACEMENT ([place]) RIGHT;  Surgeon: Leandrew Koyanagi, MD;  Location: [place];  Service: Ophthalmology;  Laterality: Right;  . CATARACT EXTRACTION W/PHACO Left 01/01/2019   Procedure: CATARACT EXTRACTION PHACO AND INTRAOCULAR LENS PLACEMENT ([place])  LEFT;  Surgeon: Leandrew Koyanagi, MD;  Location: [place];  Service: Ophthalmology;  Laterality: Left;  . COLONOSCOPY WITH PROPOFOL N/A 10/30/2019   Procedure: COLONOSCOPY WITH PROPOFOL;  Surgeon: Toledo, Benay Pike, MD;  Location: [facility] ENDOSCOPY;  Service: Gastroenterology;  Laterality: N/A;  . CORONARY ANGIOPLASTY    . ENDARTERECTOMY Right 06/14/2018   Procedure: ENDARTERECTOMY CAROTID;  Surgeon: Algernon Huxley, MD;  Location: [facility];  Service: Vascular;  Laterality: Right;  . ESOPHAGOGASTRODUODENOSCOPY N/A 07/05/2019   Procedure: ESOPHAGOGASTRODUODENOSCOPY (EGD);  Surgeon: Lollie Sails, MD;  Location: [facility] ENDOSCOPY;  Service: Endoscopy;  Laterality: N/A;  . ESOPHAGOGASTRODUODENOSCOPY (EGD) WITH PROPOFOL N/A 07/08/2019  Procedure: ESOPHAGOGASTRODUODENOSCOPY (EGD) WITH PROPOFOL;  Surgeon: Lollie Sails, MD;  Location: [facility] ENDOSCOPY;  Service: Endoscopy;  Laterality: N/A;  . EYE SURGERY    . hallux vagus correction Right   . HALLUX VALGUS AUSTIN Left 07/01/2016   Procedure: HALLUX VALGUS AUSTIN left foot;  Surgeon: Sharlotte Alamo, DPM;  Location: [facility] OR[facility];  Service: Podiatry;  Laterality: Left;  . LOWER EXTREMITY ANGIOGRAPHY Right 11/05/2018   Procedure: LOWER EXTREMITY  ANGIOGRAPHY;  Surgeon: Algernon Huxley, MD;  Location: [place] CV LAB;  Service: Cardiovascular;  Laterality: Right;    Social History        Tobacco Use  . Smoking status: Former Smoker    Packs/day: 0.25    Years: 48.00    Pack years: 12.00    Types: Cigarettes    Quit date: 06/14/2018    Years since quitting: 0.9  . Smokeless tobacco: Never Used  . Tobacco comment: since age 7  Substance Use Topics  . Alcohol use: No  . Drug use: No    Family History      Family History  Problem Relation Age of Onset  . Diabetes Mother   . Emphysema Father   . Heart disease Sister   . Heart disease Brother   . Breast cancer Neg Hx   . Ovarian cancer Neg Hx   . Colon cancer Neg Hx          Allergies  Allergen Reactions  . Lisinopril Other (See Comments)    Other reaction(s): Angioedema Of tongue only  . Codeine Nausea And Vomiting   REVIEW OF SYSTEMS(Negative unless checked)  Constitutional: [] ????Weight loss[] ????Fever[] ????Chills Cardiac:[] ????Chest pain[] ????Chest pressure[] ????Palpitations [] ????Shortness of breath when laying flat [] ????Shortness of breath at rest [] ????Shortness of breath with exertion. Vascular: [x] ????Pain in legs with walking[x] ????Pain in legsat rest[] ????Pain in legs when laying flat [x] ????Claudication [] ????Pain in feet when walking [] ????Pain in feet at rest [] ????Pain in feet when laying flat [] ????History of DVT [] ????Phlebitis [] ????Swelling in legs [] ????Varicose veins [] ????Non-healing ulcers Pulmonary: [] ????Uses home oxygen [] ????Productive cough[] ????Hemoptysis [] ????Wheeze [x] ????COPD [] ????Asthma Neurologic: [] ????Dizziness [] ????Blackouts [] ????Seizures [] ????History of stroke [] ????History of TIA[] ????Aphasia [] ????Temporary blindness[] ????Dysphagia [] ????Weaknessor numbness in arms [] ????Weakness or numbnessin legs Musculoskeletal:  [x] ????Arthritis [] ????Joint swelling [x] ????Joint pain [] ????Low back pain Hematologic:[] ????Easy bruising[] ????Easy bleeding [] ????Hypercoagulable state [] ????Anemic [] ????Hepatitis Gastrointestinal:[] ????Blood in stool[] ????Vomiting blood[x] ????Gastroesophageal reflux/heartburn[] ????Abdominal pain Genitourinary: [] ????Chronic kidney disease [] ????Difficulturination [] ????Frequenturination [] ????Burning with urination[] ????Hematuria Skin: [] ????Rashes [] ????Ulcers [] ????Wounds Psychological: [] ????History of anxiety[] ????History of major depression.    Physical Examination  Vitals:   11/19/19 1009  BP: (!) 153/78  Pulse: 75  Resp: 18  Weight: 131 lb (59.4 kg)  Height: 5\' 1"  (1.549 m)   Body mass index is 24.75 kg/m. Gen:  WD/WN, NAD Head: /AT, No temporalis wasting. Ear/Nose/Throat: Hearing grossly intact, nares w/o erythema or drainage, trachea midline Eyes: Conjunctiva clear. Sclera non-icteric Neck: Supple.  No bruit  Pulmonary:  Good air movement, equal and clear to auscultation bilaterally.  Cardiac: RRR, No JVD Vascular:  Vessel Right Left  Radial Palpable Palpable                          PT Palpable Palpable  DP Palpable Palpable    Musculoskeletal: M/S 5/5 throughout.  No deformity or atrophy. No edema. Neurologic: CN 2-12 intact. Sensation grossly intact in extremities.  Symmetrical.  Speech is fluent. Motor exam as listed above. Psychiatric: Judgment intact, Mood & affect appropriate for pt's clinical situation. Dermatologic: No rashes or ulcers noted.  No cellulitis or open  wounds.      CBC Lab Results  Component Value Date   WBC 6.4 09/02/2019   HGB 10.5 (L) 09/02/2019   HCT 34.1 (L) 09/02/2019   MCV 90.9 09/02/2019   PLT 199 09/02/2019    BMET    Component Value Date/Time   NA 137 06/11/2019 1215   K 3.2 (L) 06/11/2019 1215   CL 105 06/11/2019 1215   CO2 23 06/11/2019 1215   GLUCOSE 110  (H) 06/11/2019 1215   BUN 9 06/11/2019 1215   CREATININE 0.65 06/11/2019 1215   CALCIUM 9.2 06/11/2019 1215   GFRNONAA >60 06/11/2019 1215   GFRAA >60 06/11/2019 1215   CrCl cannot be calculated (Patient's most recent lab result is older than the maximum 21 days allowed.).  COAG Lab Results  Component Value Date   INR 1.03 06/07/2018    Radiology No results found.   Assessment/Plan Essential hypertension blood pressure control important in reducing the progression of atherosclerotic disease. On appropriate oral medications.   Hyperlipidemia lipid control important in reducing the progression of atherosclerotic disease. Continue statin therapy  Atherosclerosis of native arteries of extremity with intermittent claudication ([place]) Had intervention last year and symptoms are doing much better.  To be checked later this year  Bilateral carotid artery disease ([place]) Carotid duplex today shows a patent right carotid endarterectomy and mild 1 to 39% left ICA stenosis. We will continue to follow this on an annual basis.    Leotis Pain, MD  11/19/2019 11:02 AM    This note was created with Dragon medical transcription system.  Any errors from dictation are purely unintentional

## 2019-11-29 NOTE — Progress Notes (Signed)
[place]  Telephone:([phone]) [phone] Fax:(336) [phone]  ID: Jasmine Acosta OB: October 20, 1951  MR#: [id number]  [id number]  Patient Care Team: Kirk Ruths, MD as PCP - General (Internal Medicine)  CHIEF COMPLAINT: Iron deficiency anemia.  INTERVAL HISTORY: Patient returns to clinic today for repeat laboratory work, further evaluation, and consideration of additional IV Feraheme.  She currently feels well and is asymptomatic.  She does not complain of any weakness or fatigue. She has no neurologic complaints. She denies any recent fevers or illnesses.  She has a good appetite and denies weight loss.  She has no chest pain, shortness of breath, cough, or hemoptysis.  She denies any nausea, vomiting, constipation, or diarrhea.  She has no melena or hematochezia.  She has no urinary complaints.  Patient offers no specific complaints today.  REVIEW OF SYSTEMS:   Review of Systems  Constitutional: Negative.  Negative for fever, malaise/fatigue and weight loss.  Respiratory: Negative.  Negative for cough, hemoptysis and shortness of breath.   Cardiovascular: Negative.  Negative for chest pain and leg swelling.  Gastrointestinal: Negative.  Negative for abdominal pain, blood in stool and melena.  Genitourinary: Negative.  Negative for hematuria.  Musculoskeletal: Negative.  Negative for back pain.  Skin: Negative.  Negative for rash.  Neurological: Negative.  Negative for dizziness, focal weakness, weakness and headaches.  Psychiatric/Behavioral: Negative.  The patient is not nervous/anxious.     As per HPI. Otherwise, a complete review of systems is negative.  PAST MEDICAL HISTORY: Past Medical History:  Diagnosis Date  . Carotid artery occlusion   . COPD (chronic obstructive pulmonary disease) ([place]) 07/06/2015  . Coronary artery disease   . Headache   . Hypertension   . Hyperthyroidism    ablated with iodine  . Peripheral vascular disease ([place])    Bilateral  Carotid Artery Disease  . Personal history of arterial venous malformation (AVM)   . Pseudoclaudication     PAST SURGICAL HISTORY: Past Surgical History:  Procedure Laterality Date  . ABDOMINAL HYSTERECTOMY     heavy bleeding  . CATARACT EXTRACTION W/PHACO Right 10/09/2018   Procedure: CATARACT EXTRACTION PHACO AND INTRAOCULAR LENS PLACEMENT ([place]) RIGHT;  Surgeon: Leandrew Koyanagi, MD;  Location: [place];  Service: Ophthalmology;  Laterality: Right;  . CATARACT EXTRACTION W/PHACO Left 01/01/2019   Procedure: CATARACT EXTRACTION PHACO AND INTRAOCULAR LENS PLACEMENT ([place])  LEFT;  Surgeon: Leandrew Koyanagi, MD;  Location: [place];  Service: Ophthalmology;  Laterality: Left;  . COLONOSCOPY WITH PROPOFOL N/A 10/30/2019   Procedure: COLONOSCOPY WITH PROPOFOL;  Surgeon: Toledo, Benay Pike, MD;  Location: [facility] ENDOSCOPY;  Service: Gastroenterology;  Laterality: N/A;  . CORONARY ANGIOPLASTY    . ENDARTERECTOMY Right 06/14/2018   Procedure: ENDARTERECTOMY CAROTID;  Surgeon: Algernon Huxley, MD;  Location: [facility];  Service: Vascular;  Laterality: Right;  . ESOPHAGOGASTRODUODENOSCOPY N/A 07/05/2019   Procedure: ESOPHAGOGASTRODUODENOSCOPY (EGD);  Surgeon: Lollie Sails, MD;  Location[facility] ENDOSCOPY;  Service: Endoscopy;  Laterality: N/A;  . ESOPHAGOGASTRODUODENOSCOPY (EGD) WITH PROPOFOL N/A 07/08/2019   Procedure: ESOPHAGOGASTRODUODENOSCOPY (EGD) WITH PROPOFOL;  Surgeon: Lollie Sails, MD;  Location: [facility] ENDOSCOPY;  Service: Endoscopy;  Laterality: N/A;  . EYE SURGERY    . hallux vagus correction Right   . HALLUX VALGUS AUSTIN Left 07/01/2016   Procedure: HALLUX VALGUS AUSTIN left foot;  Surgeon: Sharlotte Alamo, DPM;  Location: [facility];  Service: Podiatry;  Laterality: Left;  . LOWER EXTREMITY ANGIOGRAPHY Right 11/05/2018   Procedure: LOWER EXTREMITY ANGIOGRAPHY;  Surgeon: Algernon Huxley, MD;  Location: [place] CV LAB;  Service: Cardiovascular;  Laterality: Right;     FAMILY HISTORY: Family History  Problem Relation Age of Onset  . Diabetes Mother   . Emphysema Father   . Heart disease Sister   . Heart disease Brother   . Breast cancer Neg Hx   . Ovarian cancer Neg Hx   . Colon cancer Neg Hx     ADVANCED DIRECTIVES (Y/N):  N  HEALTH MAINTENANCE: Social History   Tobacco Use  . Smoking status: Former Smoker    Packs/day: 0.25    Years: 48.00    Pack years: 12.00    Types: Cigarettes    Quit date: 06/14/2018    Years since quitting: 1.4  . Smokeless tobacco: Never Used  . Tobacco comment: since age 39  Substance Use Topics  . Alcohol use: No  . Drug use: No     Colonoscopy:  PAP:  Bone density:  Lipid panel:  Allergies  Allergen Reactions  . Lisinopril Other (See Comments)    Other reaction(s): Angioedema Of tongue only  . Codeine Nausea And Vomiting    Current Outpatient Medications  Medication Sig Dispense Refill  . amLODipine (NORVASC) 5 MG tablet Take 5 mg by mouth daily.   11  . aspirin EC 81 MG tablet Take 1 tablet (81 mg total) by mouth daily. 150 tablet 2  . clopidogrel (PLAVIX) 75 MG tablet Take 1 tablet (75 mg total) by mouth daily with breakfast. 30 tablet 11  . Multiple Vitamin (MULTIVITAMIN) capsule Take 1 capsule by mouth daily.    . potassium chloride (K-DUR,KLOR-CON) 10 MEQ tablet Take 10 mEq by mouth 2 (two) times daily.    . rosuvastatin (CRESTOR) 40 MG tablet Take 40 mg by mouth daily.    Marland Kitchen albuterol (VENTOLIN HFA) 108 (90 Base) MCG/ACT inhaler Inhale 2 puffs into the lungs every 6 (six) hours as needed for wheezing or shortness of breath. (Patient not taking: Reported on 11/19/2019) 8 g 0  . iron polysaccharides (NU-IRON) 150 MG capsule Take 1 capsule by mouth 1 day or 1 dose.    Marland Kitchen Omeprazole 20 MG TBEC Take 1 tablet by mouth 1 day or 1 dose.    Vladimir Faster Glycol-Propyl Glycol (SYSTANE OP) Apply to eye 3 (three) times daily as needed.     No current facility-administered medications for this visit.     OBJECTIVE: Vitals:   12/05/19 1439  BP: (!) 157/85  Pulse: 87  Resp: 20     Body mass index is 24.88 kg/m.    ECOG FS:0 - Asymptomatic  General: Well-developed, well-nourished, no acute distress. Eyes: Pink conjunctiva, anicteric sclera. HEENT: Normocephalic, moist mucous membranes. Lungs: No audible wheezing or coughing. Heart: Regular rate and rhythm. Abdomen: Soft, nontender, no obvious distention. Musculoskeletal: No edema, cyanosis, or clubbing. Neuro: Alert, answering all questions appropriately. Cranial nerves grossly intact. Skin: No rashes or petechiae noted. Psych: Normal affect.  LAB RESULTS:  Lab Results  Component Value Date   NA 137 06/11/2019   K 3.2 (L) 06/11/2019   CL 105 06/11/2019   CO2 23 06/11/2019   GLUCOSE 110 (H) 06/11/2019   BUN 9 06/11/2019   CREATININE 0.65 06/11/2019   CALCIUM 9.2 06/11/2019   PROT 7.7 06/11/2019   ALBUMIN 4.2 06/11/2019   AST 18 06/11/2019   ALT 17 06/11/2019   ALKPHOS 67 06/11/2019   BILITOT 0.6 06/11/2019   GFRNONAA >60 06/11/2019   GFRAA >  60 06/11/2019    Lab Results  Component Value Date   WBC 6.2 12/05/2019   NEUTROABS 4.0 12/05/2019   HGB 11.0 (L) 12/05/2019   HCT 35.4 (L) 12/05/2019   MCV 93.4 12/05/2019   PLT 192 12/05/2019   Lab Results  Component Value Date   IRON 161 12/05/2019   TIBC 388 12/05/2019   IRONPCTSAT 42 (H) 12/05/2019   Lab Results  Component Value Date   FERRITIN 9 (L) 12/05/2019     STUDIES: VAS US CAROTID  Result Date: 11/19/2019 Carotid Arterial Duplex Study Indications:       Carotid artery disease. Other Factors:     06/14/2018 Right endarterectomy. Comparison Study:  06/07/2019 Performing Technologist: Almira Coaster RVS  Examination Guidelines: A complete evaluation includes B-mode imaging, spectral Doppler, color Doppler, and power Doppler as needed of all accessible portions of each vessel. Bilateral testing is considered an integral part of a complete examination.  Limited examinations for reoccurring indications may be performed as noted.  Right Carotid Findings: +----------+-------+--------+--------+--------------------------------+--------+           PSV    EDV cm/sStenosisPlaque Description              Comments           cm/s                                                            +----------+-------+--------+--------+--------------------------------+--------+ CCA Prox  92     23                                                       +----------+-------+--------+--------+--------------------------------+--------+ CCA Mid   94     26              hypoechoic, irregular and                                                 calcific                                 +----------+-------+--------+--------+--------------------------------+--------+ CCA Distal82     22                                                       +----------+-------+--------+--------+--------------------------------+--------+ ICA Prox  44     11                                                       +----------+-------+--------+--------+--------------------------------+--------+ ICA Mid   77     27                                                       +----------+-------+--------+--------+--------------------------------+--------+  ICA Distal69     19                                                       +----------+-------+--------+--------+--------------------------------+--------+ ECA       87     8                                                        +----------+-------+--------+--------+--------------------------------+--------+ +----------+--------+-------+--------+-------------------+           PSV cm/sEDV cmsDescribeArm Pressure (mmHG) +----------+--------+-------+--------+-------------------+ [id number]     12                                 +----------+--------+-------+--------+-------------------+  +---------+--------+--+--------+--+ VertebralPSV cm/s57EDV cm/s10 +---------+--------+--+--------+--+   Left Carotid Findings: +----------+-------+-------+--------+---------------------------------+--------+           PSV    EDV    StenosisPlaque Description               Comments           cm/s   cm/s                                                     +----------+-------+-------+--------+---------------------------------+--------+ CCA Prox  83     19                                                       +----------+-------+-------+--------+---------------------------------+--------+ CCA Mid   78     17             heterogenous, irregular and                                               calcific                                  +----------+-------+-------+--------+---------------------------------+--------+ CCA Distal74     20             heterogenous, irregular and                                               calcific                                  +----------+-------+-------+--------+---------------------------------+--------+ ICA Prox  101    13             heterogenous, irregular and  calcific                                  +----------+-------+-------+--------+---------------------------------+--------+ ICA Mid   82     16                                                       +----------+-------+-------+--------+---------------------------------+--------+ ICA Distal54     14                                                       +----------+-------+-------+--------+---------------------------------+--------+ ECA       184    26             heterogenous, irregular and                                               calcific                                  +----------+-------+-------+--------+---------------------------------+--------+  +----------+--------+--------+--------+-------------------+           PSV cm/sEDV cm/sDescribeArm Pressure (mmHG) +----------+--------+--------+--------+-------------------+ Subclavian213     13                                  +----------+--------+--------+--------+-------------------+ +---------+--------+--+--------+--+ VertebralPSV cm/s43EDV cm/s14 +---------+--------+--+--------+--+   Summary: Right Carotid: Velocities in the right ICA are consistent with a 1-39%                stenosis. Left Carotid: Velocities in the left ICA are consistent with a 1-39% stenosis. Vertebrals:  Bilateral vertebral arteries demonstrate antegrade flow. Subclavians: Normal flow hemodynamics were seen in bilateral subclavian              arteries. *See table(s) above for measurements and observations.  Electronically signed by Leotis Pain MD on 11/19/2019 at 3:22:09 PM.    Final     ASSESSMENT: Iron deficiency anemia.  PLAN:    1.  Iron deficiency anemia: Patient's hemoglobin continues to trend up and is now 11.0.  Her ferritin remains mildly low at 9, but the remainder of her iron panel was within normal limits.  She had upper endoscopy on July 08, 2019 that revealed 1 angiectasia in the duodenum that was treated with argon plasma coagulation.  Colonoscopy on October 28, 2019 did not reveal any significant pathology.  No intervention is needed.  Return to clinic in 3 months with repeat laboratory work other evaluation.    I spent a total of 20 minutes reviewing chart data, face-to-face evaluation with the patient, counseling and coordination of care as detailed above.  Patient expressed understanding and was in agreement with this plan. She also understands that She can call clinic at any time with any questions, concerns, or complaints.    Lloyd Huger, MD   12/06/2019 3:35 PM

## 2019-12-04 ENCOUNTER — Other Ambulatory Visit: Payer: Self-pay

## 2019-12-04 ENCOUNTER — Encounter: Payer: Self-pay | Admitting: Oncology

## 2019-12-04 NOTE — Progress Notes (Signed)
Patient has no concerns at this time, she is doing well. She has received her first COVID vaccine.

## 2019-12-05 ENCOUNTER — Inpatient Hospital Stay: Payer: PPO

## 2019-12-05 ENCOUNTER — Other Ambulatory Visit: Payer: Self-pay

## 2019-12-05 ENCOUNTER — Inpatient Hospital Stay (HOSPITAL_BASED_OUTPATIENT_CLINIC_OR_DEPARTMENT_OTHER): Payer: PPO | Admitting: Oncology

## 2019-12-05 ENCOUNTER — Inpatient Hospital Stay: Payer: PPO | Attending: Oncology

## 2019-12-05 VITALS — BP 157/85 | HR 87 | Resp 20 | Wt 131.7 lb

## 2019-12-05 DIAGNOSIS — D509 Iron deficiency anemia, unspecified: Secondary | ICD-10-CM

## 2019-12-05 LAB — CBC WITH DIFFERENTIAL/PLATELET
Abs Immature Granulocytes: 0.01 10*3/uL (ref 0.00–0.07)
Basophils Absolute: 0 10*3/uL (ref 0.0–0.1)
Basophils Relative: 1 %
Eosinophils Absolute: 0.1 10*3/uL (ref 0.0–0.5)
Eosinophils Relative: 1 %
HCT: 35.4 % — ABNORMAL LOW (ref 36.0–46.0)
Hemoglobin: 11 g/dL — ABNORMAL LOW (ref 12.0–15.0)
Immature Granulocytes: 0 %
Lymphocytes Relative: 27 %
Lymphs Abs: 1.7 10*3/uL (ref 0.7–4.0)
MCH: 29 pg (ref 26.0–34.0)
MCHC: 31.1 g/dL (ref 30.0–36.0)
MCV: 93.4 fL (ref 80.0–100.0)
Monocytes Absolute: 0.4 10*3/uL (ref 0.1–1.0)
Monocytes Relative: 7 %
Neutro Abs: 4 10*3/uL (ref 1.7–7.7)
Neutrophils Relative %: 64 %
Platelets: 192 10*3/uL (ref 150–400)
RBC: 3.79 MIL/uL — ABNORMAL LOW (ref 3.87–5.11)
RDW: 14.3 % (ref 11.5–15.5)
WBC: 6.2 10*3/uL (ref 4.0–10.5)
nRBC: 0 % (ref 0.0–0.2)

## 2019-12-05 LAB — FERRITIN: Ferritin: 9 ng/mL — ABNORMAL LOW (ref 11–307)

## 2019-12-05 LAB — IRON AND TIBC
Iron: 161 ug/dL (ref 28–170)
Saturation Ratios: 42 % — ABNORMAL HIGH (ref 10.4–31.8)
TIBC: 388 ug/dL (ref 250–450)
UIBC: 227 ug/dL

## 2019-12-11 DIAGNOSIS — D509 Iron deficiency anemia, unspecified: Secondary | ICD-10-CM | POA: Diagnosis not present

## 2019-12-11 DIAGNOSIS — Z8774 Personal history of (corrected) congenital malformations of heart and circulatory system: Secondary | ICD-10-CM | POA: Diagnosis not present

## 2019-12-18 DIAGNOSIS — I10 Essential (primary) hypertension: Secondary | ICD-10-CM | POA: Diagnosis not present

## 2019-12-18 DIAGNOSIS — I779 Disorder of arteries and arterioles, unspecified: Secondary | ICD-10-CM | POA: Diagnosis not present

## 2019-12-25 DIAGNOSIS — Z0000 Encounter for general adult medical examination without abnormal findings: Secondary | ICD-10-CM | POA: Diagnosis not present

## 2019-12-25 DIAGNOSIS — I10 Essential (primary) hypertension: Secondary | ICD-10-CM | POA: Diagnosis not present

## 2019-12-25 DIAGNOSIS — J449 Chronic obstructive pulmonary disease, unspecified: Secondary | ICD-10-CM | POA: Diagnosis not present

## 2019-12-25 DIAGNOSIS — I739 Peripheral vascular disease, unspecified: Secondary | ICD-10-CM | POA: Diagnosis not present

## 2019-12-25 DIAGNOSIS — I779 Disorder of arteries and arterioles, unspecified: Secondary | ICD-10-CM | POA: Diagnosis not present

## 2020-01-10 DIAGNOSIS — H04123 Dry eye syndrome of bilateral lacrimal glands: Secondary | ICD-10-CM | POA: Diagnosis not present

## 2020-01-29 DIAGNOSIS — D509 Iron deficiency anemia, unspecified: Secondary | ICD-10-CM | POA: Diagnosis not present

## 2020-02-28 NOTE — Progress Notes (Signed)
[place]  Telephone:(336) [phone] Fax:(336) [phone]  ID: Jasmine Acosta OB: 27-Jul-1951  MR#: [id number]  [id number]  Patient Care Team: Kirk Ruths, MD as PCP - General (Internal Medicine) Lloyd Huger, MD as Consulting Physician (Oncology)  CHIEF COMPLAINT: Iron deficiency anemia.  INTERVAL HISTORY: Patient returns to clinic today for repeat laboratory work, further evaluation, and consideration of additional IV Feraheme.  She continues to feel well remains asymptomatic.  She does not complain of any weakness or fatigue. She has no neurologic complaints. She denies any recent fevers or illnesses.  She has a good appetite and denies weight loss.  She has no chest pain, shortness of breath, cough, or hemoptysis.  She denies any nausea, vomiting, constipation, or diarrhea.  She has no melena or hematochezia.  She has no urinary complaints.  Patient feels at her baseline and offers no specific complaints today.   REVIEW OF SYSTEMS:   Review of Systems  Constitutional: Negative.  Negative for fever, malaise/fatigue and weight loss.  Respiratory: Negative.  Negative for cough, hemoptysis and shortness of breath.   Cardiovascular: Negative.  Negative for chest pain and leg swelling.  Gastrointestinal: Negative.  Negative for abdominal pain, blood in stool and melena.  Genitourinary: Negative.  Negative for hematuria.  Musculoskeletal: Negative.  Negative for back pain.  Skin: Negative.  Negative for rash.  Neurological: Negative.  Negative for dizziness, focal weakness, weakness and headaches.  Psychiatric/Behavioral: Negative.  The patient is not nervous/anxious.     As per HPI. Otherwise, a complete review of systems is negative.  PAST MEDICAL HISTORY: Past Medical History:  Diagnosis Date  . Carotid artery occlusion   . COPD (chronic obstructive pulmonary disease) ([place]) 07/06/2015  . Coronary artery disease   . Headache   . Hypertension   .  Hyperthyroidism    ablated with iodine  . Peripheral vascular disease ([place])    Bilateral Carotid Artery Disease  . Personal history of arterial venous malformation (AVM)   . Pseudoclaudication     PAST SURGICAL HISTORY: Past Surgical History:  Procedure Laterality Date  . ABDOMINAL HYSTERECTOMY     heavy bleeding  . CATARACT EXTRACTION W/PHACO Right 10/09/2018   Procedure: CATARACT EXTRACTION PHACO AND INTRAOCULAR LENS PLACEMENT ([place]) RIGHT;  Surgeon: Leandrew Koyanagi, MD;  Location: [place];  Service: Ophthalmology;  Laterality: Right;  . CATARACT EXTRACTION W/PHACO Left 01/01/2019   Procedure: CATARACT EXTRACTION PHACO AND INTRAOCULAR LENS PLACEMENT ([place])  LEFT;  Surgeon: Leandrew Koyanagi, MD;  Location: [place];  Service: Ophthalmology;  Laterality: Left;  . COLONOSCOPY WITH PROPOFOL N/A 10/30/2019   Procedure: COLONOSCOPY WITH PROPOFOL;  Surgeon: Toledo, Benay Pike, MD;  Location: [facility] ENDOSCOPY;  Service: Gastroenterology;  Laterality: N/A;  . CORONARY ANGIOPLASTY    . ENDARTERECTOMY Right 06/14/2018   Procedure: ENDARTERECTOMY CAROTID;  Surgeon: Algernon Huxley, MD;  Location: [facility];  Service: Vascular;  Laterality: Right;  . ESOPHAGOGASTRODUODENOSCOPY N/A 07/05/2019   Procedure: ESOPHAGOGASTRODUODENOSCOPY (EGD);  Surgeon: Lollie Sails, MD;  Location: [facility] ENDOSCOPY;  Service: Endoscopy;  Laterality: N/A;  . ESOPHAGOGASTRODUODENOSCOPY (EGD) WITH PROPOFOL N/A 07/08/2019   Procedure: ESOPHAGOGASTRODUODENOSCOPY (EGD) WITH PROPOFOL;  Surgeon: Lollie Sails, MD;  Location: [facility] ENDOSCOPY;  Service: Endoscopy;  Laterality: N/A;  . EYE SURGERY    . hallux vagus correction Right   . HALLUX VALGUS AUSTIN Left 07/01/2016   Procedure: HALLUX VALGUS AUSTIN left foot;  Surgeon: Sharlotte Alamo, DPM;  Location: [facility] OR[facility];  Service: Podiatry;  Laterality:  Left;  . LOWER EXTREMITY ANGIOGRAPHY Right 11/05/2018   Procedure: LOWER EXTREMITY ANGIOGRAPHY;  Surgeon: Algernon Huxley, MD;  Location: [place] [facility];  Service: Cardiovascular;  Laterality: Right;    FAMILY HISTORY: Family History  Problem Relation Age of Onset  . Diabetes Mother   . Emphysema Father   . Heart disease Sister   . Heart disease Brother   . Breast cancer Neg Hx   . Ovarian cancer Neg Hx   . Colon cancer Neg Hx     ADVANCED DIRECTIVES (Y/N):  N  HEALTH MAINTENANCE: Social History   Tobacco Use  . Smoking status: Former Smoker    Packs/day: 0.25    Years: 48.00    Pack years: 12.00    Types: Cigarettes    Quit date: 06/14/2018    Years since quitting: 1.7  . Smokeless tobacco: Never Used  . Tobacco comment: since age 75  Substance Use Topics  . Alcohol use: No  . Drug use: No     Colonoscopy:  PAP:  Bone density:  Lipid panel:  Allergies  Allergen Reactions  . Lisinopril Other (See Comments)    Other reaction(s): Angioedema Of tongue only  . Codeine Nausea And Vomiting    Current Outpatient Medications  Medication Sig Dispense Refill  . albuterol (VENTOLIN HFA) 108 (90 Base) MCG/ACT inhaler Inhale 2 puffs into the lungs every 6 (six) hours as needed for wheezing or shortness of breath. 8 g 0  . amLODipine (NORVASC) 5 MG tablet Take 5 mg by mouth daily.   11  . aspirin EC 81 MG tablet Take 1 tablet (81 mg total) by mouth daily. 150 tablet 2  . clopidogrel (PLAVIX) 75 MG tablet Take 1 tablet (75 mg total) by mouth daily with breakfast. 30 tablet 11  . iron polysaccharides (NU-IRON) 150 MG capsule Take 1 capsule by mouth 1 day or 1 dose.    . Multiple Vitamin (MULTIVITAMIN) capsule Take 1 capsule by mouth daily.    . Omeprazole 20 MG TBEC Take 1 tablet by mouth 1 day or 1 dose.    Vladimir Faster Glycol-Propyl Glycol (SYSTANE OP) Apply to eye 3 (three) times daily as needed.    . potassium chloride (K-DUR,KLOR-CON) 10 MEQ tablet Take 10 mEq by mouth 2 (two) times daily.    . rosuvastatin (CRESTOR) 40 MG tablet Take 40 mg by mouth daily.    Marland Kitchen topiramate  (TOPAMAX) 25 MG tablet Take 25 mg by mouth daily.     No current facility-administered medications for this visit.   Facility-Administered Medications Ordered in Other Visits  Medication Dose Route Frequency Provider Last Rate Last Admin  . ferumoxytol (FERAHEME) 510 mg in sodium chloride 0.9 % 100 mL IVPB  510 mg Intravenous Once Lloyd Huger, MD 468 mL/hr at 03/02/20 1348 510 mg at 03/02/20 1348    OBJECTIVE: Vitals:   03/02/20 1326  BP: (!) 156/81  Pulse: 80  Resp: 20  Temp: (!) 97 F (36.1 C)  SpO2: 100%     Body mass index is 24.71 kg/m.    ECOG FS:0 - Asymptomatic  General: Well-developed, well-nourished, no acute distress. Eyes: Pink conjunctiva, anicteric sclera. HEENT: Normocephalic, moist mucous membranes. Lungs: No audible wheezing or coughing. Heart: Regular rate and rhythm. Abdomen: Soft, nontender, no obvious distention. Musculoskeletal: No edema, cyanosis, or clubbing. Neuro: Alert, answering all questions appropriately. Cranial nerves grossly intact. Skin: No rashes or petechiae noted. Psych: Normal affect.  LAB RESULTS:  Lab  Results  Component Value Date   NA 137 06/11/2019   K 3.2 (L) 06/11/2019   CL 105 06/11/2019   CO2 23 06/11/2019   GLUCOSE 110 (H) 06/11/2019   BUN 9 06/11/2019   CREATININE 0.65 06/11/2019   CALCIUM 9.2 06/11/2019   PROT 7.7 06/11/2019   ALBUMIN 4.2 06/11/2019   AST 18 06/11/2019   ALT 17 06/11/2019   ALKPHOS 67 06/11/2019   BILITOT 0.6 06/11/2019   GFRNONAA >60 06/11/2019   GFRAA >60 06/11/2019    Lab Results  Component Value Date   WBC 6.8 03/02/2020   NEUTROABS 4.2 03/02/2020   HGB 10.7 (L) 03/02/2020   HCT 34.2 (L) 03/02/2020   MCV 91.0 03/02/2020   PLT 225 03/02/2020   Lab Results  Component Value Date   IRON 46 03/02/2020   TIBC 427 03/02/2020   IRONPCTSAT 11 03/02/2020   Lab Results  Component Value Date   FERRITIN 5 (L) 03/02/2020     STUDIES: No results found.  ASSESSMENT: Iron  deficiency anemia.  PLAN:    1.  Iron deficiency anemia: Although patient remains asymptomatic, her hemoglobin and iron stores have trended down slightly.  She had upper endoscopy on July 08, 2019 that revealed 1 angiectasia in the duodenum that was treated with argon plasma coagulation.  Colonoscopy on October 28, 2019 did not reveal any significant pathology.  Proceed with 510 mg IV Feraheme today.  Patient does not wish to return for second infusion.  Return to clinic in 4 months with repeat laboratory work, further evaluation, and continuation of treatment if needed.    I spent a total of 30 minutes reviewing chart data, face-to-face evaluation with the patient, counseling and coordination of care as detailed above.  Patient expressed understanding and was in agreement with this plan. She also understands that She can call clinic at any time with any questions, concerns, or complaints.    Lloyd Huger, MD   03/02/2020 1:58 PM

## 2020-03-02 ENCOUNTER — Inpatient Hospital Stay: Payer: PPO

## 2020-03-02 ENCOUNTER — Other Ambulatory Visit: Payer: Self-pay

## 2020-03-02 ENCOUNTER — Encounter: Payer: Self-pay | Admitting: Oncology

## 2020-03-02 ENCOUNTER — Inpatient Hospital Stay: Payer: PPO | Attending: Oncology

## 2020-03-02 ENCOUNTER — Inpatient Hospital Stay (HOSPITAL_BASED_OUTPATIENT_CLINIC_OR_DEPARTMENT_OTHER): Payer: PPO | Admitting: Oncology

## 2020-03-02 VITALS — BP 136/76 | HR 72 | Temp 97.5°F | Resp 18

## 2020-03-02 VITALS — BP 156/81 | HR 80 | Temp 97.0°F | Resp 20 | Wt 130.8 lb

## 2020-03-02 DIAGNOSIS — D509 Iron deficiency anemia, unspecified: Secondary | ICD-10-CM

## 2020-03-02 LAB — CBC WITH DIFFERENTIAL/PLATELET
Abs Immature Granulocytes: 0.01 10*3/uL (ref 0.00–0.07)
Basophils Absolute: 0.1 10*3/uL (ref 0.0–0.1)
Basophils Relative: 1 %
Eosinophils Absolute: 0.5 10*3/uL (ref 0.0–0.5)
Eosinophils Relative: 7 %
HCT: 34.2 % — ABNORMAL LOW (ref 36.0–46.0)
Hemoglobin: 10.7 g/dL — ABNORMAL LOW (ref 12.0–15.0)
Immature Granulocytes: 0 %
Lymphocytes Relative: 24 %
Lymphs Abs: 1.6 10*3/uL (ref 0.7–4.0)
MCH: 28.5 pg (ref 26.0–34.0)
MCHC: 31.3 g/dL (ref 30.0–36.0)
MCV: 91 fL (ref 80.0–100.0)
Monocytes Absolute: 0.5 10*3/uL (ref 0.1–1.0)
Monocytes Relative: 8 %
Neutro Abs: 4.2 10*3/uL (ref 1.7–7.7)
Neutrophils Relative %: 60 %
Platelets: 225 10*3/uL (ref 150–400)
RBC: 3.76 MIL/uL — ABNORMAL LOW (ref 3.87–5.11)
RDW: 12.8 % (ref 11.5–15.5)
WBC: 6.8 10*3/uL (ref 4.0–10.5)
nRBC: 0 % (ref 0.0–0.2)

## 2020-03-02 LAB — FERRITIN: Ferritin: 5 ng/mL — ABNORMAL LOW (ref 11–307)

## 2020-03-02 LAB — IRON AND TIBC
Iron: 46 ug/dL (ref 28–170)
Saturation Ratios: 11 % (ref 10.4–31.8)
TIBC: 427 ug/dL (ref 250–450)
UIBC: 381 ug/dL

## 2020-03-02 MED ORDER — SODIUM CHLORIDE 0.9 % IV SOLN
Freq: Once | INTRAVENOUS | Status: AC
  Filled 2020-03-02: qty 250

## 2020-03-02 MED ORDER — SODIUM CHLORIDE 0.9 % IV SOLN
510.0000 mg | Freq: Once | INTRAVENOUS | Status: AC
  Administered 2020-03-02: 510 mg via INTRAVENOUS
  Filled 2020-03-02: qty 510

## 2020-04-23 ENCOUNTER — Other Ambulatory Visit: Payer: Self-pay | Admitting: Internal Medicine

## 2020-04-23 DIAGNOSIS — Z1231 Encounter for screening mammogram for malignant neoplasm of breast: Secondary | ICD-10-CM

## 2020-04-28 ENCOUNTER — Telehealth: Payer: Self-pay | Admitting: *Deleted

## 2020-04-28 DIAGNOSIS — Z87891 Personal history of nicotine dependence: Secondary | ICD-10-CM

## 2020-04-28 DIAGNOSIS — Z122 Encounter for screening for malignant neoplasm of respiratory organs: Secondary | ICD-10-CM

## 2020-04-28 NOTE — Telephone Encounter (Signed)
Received referral for initial lung cancer screening scan. Contacted patient and obtained smoking history,(fromer, quit 2019, 50 pack year) as well as answering questions related to screening process. Patient denies signs of lung cancer such as weight loss or hemoptysis. Patient denies comorbidity that would prevent curative treatment if lung cancer were found. Patient is scheduled for shared decision making visit and CT scan on 05/12/20 at 145pm.

## 2020-05-12 ENCOUNTER — Inpatient Hospital Stay: Payer: PPO | Attending: Oncology | Admitting: Hospice and Palliative Medicine

## 2020-05-12 ENCOUNTER — Ambulatory Visit
Admission: RE | Admit: 2020-05-12 | Discharge: 2020-05-12 | Disposition: A | Discharge status: Home / Self Care | Payer: PPO | Source: Ambulatory Visit | Attending: Oncology | Admitting: Oncology

## 2020-05-12 ENCOUNTER — Encounter: Payer: Self-pay | Admitting: Hospice and Palliative Medicine

## 2020-05-12 ENCOUNTER — Other Ambulatory Visit: Payer: Self-pay

## 2020-05-12 DIAGNOSIS — Z87891 Personal history of nicotine dependence: Secondary | ICD-10-CM

## 2020-05-12 DIAGNOSIS — Z122 Encounter for screening for malignant neoplasm of respiratory organs: Secondary | ICD-10-CM | POA: Diagnosis not present

## 2020-05-12 NOTE — Progress Notes (Signed)
Virtual Visit via Video Note  I connected with@ on 05/12/20 at@ by a video enabled telemedicine application and verified that I am speaking with the correct person using two identifiers.   I discussed the limitations of evaluation and management by telemedicine and the availability of in person appointments. The patient expressed understanding and agreed to proceed.  In accordance with CMS guidelines, patient has met eligibility criteria including age, absence of signs or symptoms of lung cancer.  Social History   Tobacco Use  . Smoking status: Former Smoker    Packs/day: 1.00    Years: 50.00    Pack years: 50.00    Types: Cigarettes    Quit date: 06/14/2018    Years since quitting: 1.9  . Smokeless tobacco: Never Used  . Tobacco comment: since age 53  Vaping Use  . Vaping Use: Never used  Substance Use Topics  . Alcohol use: No  . Drug use: No      A shared decision-making session was conducted prior to the performance of CT scan. This includes one or more decision aids, includes benefits and harms of screening, follow-up diagnostic testing, over-diagnosis, false positive rate, and total radiation exposure.   Counseling on the importance of adherence to annual lung cancer LDCT screening, impact of co-morbidities, and ability or willingness to undergo diagnosis and treatment is imperative for compliance of the program.   Counseling on the importance of continued smoking cessation for former smokers; the importance of smoking cessation for current smokers, and information about tobacco cessation interventions have been given to patient including [place] and [facility] quit [place] programs.   Written order for lung cancer screening with LDCT has been given to the patient and any and all questions have been answered to the best of my abilities.    Yearly follow up will be coordinated by Burgess Estelle, Thoracic Navigator.  Time Total: 15 minutes  Visit consisted of counseling  and education dealing with complex health screening. Greater than 50%  of this time was spent counseling and coordinating care related to the above assessment and plan.  Signed by: Altha Harm, PhD, NP-C

## 2020-05-15 ENCOUNTER — Encounter: Payer: Self-pay | Admitting: *Deleted

## 2020-06-09 ENCOUNTER — Encounter (INDEPENDENT_AMBULATORY_CARE_PROVIDER_SITE_OTHER): Payer: PPO

## 2020-06-09 ENCOUNTER — Ambulatory Visit (INDEPENDENT_AMBULATORY_CARE_PROVIDER_SITE_OTHER): Payer: PPO | Admitting: Vascular Surgery

## 2020-06-11 ENCOUNTER — Ambulatory Visit
Admission: RE | Admit: 2020-06-11 | Discharge: 2020-06-11 | Disposition: A | Discharge status: Home / Self Care | Payer: PPO | Source: Ambulatory Visit | Attending: Internal Medicine | Admitting: Internal Medicine

## 2020-06-11 ENCOUNTER — Other Ambulatory Visit: Payer: Self-pay

## 2020-06-11 DIAGNOSIS — Z1231 Encounter for screening mammogram for malignant neoplasm of breast: Secondary | ICD-10-CM | POA: Diagnosis not present

## 2020-06-24 DIAGNOSIS — I10 Essential (primary) hypertension: Secondary | ICD-10-CM | POA: Diagnosis not present

## 2020-06-24 DIAGNOSIS — I739 Peripheral vascular disease, unspecified: Secondary | ICD-10-CM | POA: Diagnosis not present

## 2020-06-27 NOTE — Progress Notes (Signed)
[place]  Telephone:(336) [phone] Fax:(336) [phone]  ID: Jasmine Acosta OB: Feb 15, 1951  MR#: [id number]  TOI#:[id number]  Patient Care Team: Kirk Ruths, MD as PCP - General (Internal Medicine) Lloyd Huger, MD as Consulting Physician (Oncology)  CHIEF COMPLAINT: Iron deficiency anemia.  INTERVAL HISTORY: Patient returns to clinic today for repeat laboratory work, further evaluation, and consideration of additional IV Feraheme.  She currently feels well and is asymptomatic.  She does not complain of any weakness or fatigue. She has no neurologic complaints. She denies any recent fevers or illnesses.  She has a good appetite and denies weight loss.  She has no chest pain, shortness of breath, cough, or hemoptysis.  She denies any nausea, vomiting, constipation, or diarrhea.  She has no melena or hematochezia.  She has no urinary complaints.  Patient feels at her baseline offers no specific complaints today.   REVIEW OF SYSTEMS:   Review of Systems  Constitutional: Negative.  Negative for fever, malaise/fatigue and weight loss.  Respiratory: Negative.  Negative for cough, hemoptysis and shortness of breath.   Cardiovascular: Negative.  Negative for chest pain and leg swelling.  Gastrointestinal: Negative.  Negative for abdominal pain, blood in stool and melena.  Genitourinary: Negative.  Negative for hematuria.  Musculoskeletal: Negative.  Negative for back pain.  Skin: Negative.  Negative for rash.  Neurological: Negative.  Negative for dizziness, focal weakness, weakness and headaches.  Psychiatric/Behavioral: Negative.  The patient is not nervous/anxious.     As per HPI. Otherwise, a complete review of systems is negative.  PAST MEDICAL HISTORY: Past Medical History:  Diagnosis Date  . Carotid artery occlusion   . COPD (chronic obstructive pulmonary disease) ([place]) 07/06/2015  . Coronary artery disease   . Headache   . Hypertension   .  Hyperthyroidism    ablated with iodine  . Peripheral vascular disease ([place])    Bilateral Carotid Artery Disease  . Personal history of arterial venous malformation (AVM)   . Pseudoclaudication     PAST SURGICAL HISTORY: Past Surgical History:  Procedure Laterality Date  . ABDOMINAL HYSTERECTOMY     heavy bleeding  . CATARACT EXTRACTION W/PHACO Right 10/09/2018   Procedure: CATARACT EXTRACTION PHACO AND INTRAOCULAR LENS PLACEMENT ([place]) RIGHT;  Surgeon: Leandrew Koyanagi, MD;  Location: [place];  Service: Ophthalmology;  Laterality: Right;  . CATARACT EXTRACTION W/PHACO Left 01/01/2019   Procedure: CATARACT EXTRACTION PHACO AND INTRAOCULAR LENS PLACEMENT ([place])  LEFT;  Surgeon: Leandrew Koyanagi, MD;  Location: [place];  Service: Ophthalmology;  Laterality: Left;  . COLONOSCOPY WITH PROPOFOL N/A 10/30/2019   Procedure: COLONOSCOPY WITH PROPOFOL;  Surgeon: Toledo, Benay Pike, MD;  Location: [facility] ENDOSCOPY;  Service: Gastroenterology;  Laterality: N/A;  . CORONARY ANGIOPLASTY    . ENDARTERECTOMY Right 06/14/2018   Procedure: ENDARTERECTOMY CAROTID;  Surgeon: Algernon Huxley, MD;  Location: [facility];  Service: Vascular;  Laterality: Right;  . ESOPHAGOGASTRODUODENOSCOPY N/A 07/05/2019   Procedure: ESOPHAGOGASTRODUODENOSCOPY (EGD);  Surgeon: Lollie Sails, MD;  Location: [facility] ENDOSCOPY;  Service: Endoscopy;  Laterality: N/A;  . ESOPHAGOGASTRODUODENOSCOPY (EGD) WITH PROPOFOL N/A 07/08/2019   Procedure: ESOPHAGOGASTRODUODENOSCOPY (EGD) WITH PROPOFOL;  Surgeon: Lollie Sails, MD;  Location: [facility] ENDOSCOPY;  Service: Endoscopy;  Laterality: N/A;  . EYE SURGERY    . hallux vagus correction Right   . HALLUX VALGUS AUSTIN Left 07/01/2016   Procedure: HALLUX VALGUS AUSTIN left foot;  Surgeon: Sharlotte Alamo, DPM;  Location: [facility];  Service: Podiatry;  Laterality: Left;  .  LOWER EXTREMITY ANGIOGRAPHY Right 11/05/2018   Procedure: LOWER EXTREMITY ANGIOGRAPHY;  Surgeon: Algernon Huxley, MD;  Location: [place] [facility];  Service: Cardiovascular;  Laterality: Right;    FAMILY HISTORY: Family History  Problem Relation Age of Onset  . Diabetes Mother   . Emphysema Father   . Heart disease Sister   . Heart disease Brother   . Breast cancer Neg Hx   . Ovarian cancer Neg Hx   . Colon cancer Neg Hx     ADVANCED DIRECTIVES (Y/N):  N  HEALTH MAINTENANCE: Social History   Tobacco Use  . Smoking status: Former Smoker    Packs/day: 1.00    Years: 50.00    Pack years: 50.00    Types: Cigarettes    Quit date: 06/14/2018    Years since quitting: 2.0  . Smokeless tobacco: Never Used  . Tobacco comment: since age 15  Vaping Use  . Vaping Use: Never used  Substance Use Topics  . Alcohol use: No  . Drug use: No     Colonoscopy:  PAP:  Bone density:  Lipid panel:  Allergies  Allergen Reactions  . Lisinopril Other (See Comments)    Other reaction(s): Angioedema Of tongue only  . Codeine Nausea And Vomiting    Current Outpatient Medications  Medication Sig Dispense Refill  . albuterol (VENTOLIN HFA) 108 (90 Base) MCG/ACT inhaler Inhale 2 puffs into the lungs every 6 (six) hours as needed for wheezing or shortness of breath. 8 g 0  . amLODipine (NORVASC) 5 MG tablet Take 5 mg by mouth daily.   11  . aspirin EC 81 MG tablet Take 1 tablet (81 mg total) by mouth daily. 150 tablet 2  . clopidogrel (PLAVIX) 75 MG tablet Take 1 tablet (75 mg total) by mouth daily with breakfast. 30 tablet 11  . gabapentin (NEURONTIN) 100 MG capsule Take 100 mg by mouth in the morning and at bedtime.    . Multiple Vitamin (MULTIVITAMIN) capsule Take 1 capsule by mouth daily.    . potassium chloride (K-DUR,KLOR-CON) 10 MEQ tablet Take 10 mEq by mouth 2 (two) times daily.    . rosuvastatin (CRESTOR) 40 MG tablet Take 40 mg by mouth daily.     No current facility-administered medications for this visit.    OBJECTIVE: Vitals:   07/03/20 1302  BP: 129/74  Pulse: 87   Temp: 97.6 F (36.4 C)     Body mass index is 24.81 kg/m.    ECOG FS:0 - Asymptomatic  General: Well-developed, well-nourished, no acute distress. Eyes: Pink conjunctiva, anicteric sclera. HEENT: Normocephalic, moist mucous membranes. Lungs: No audible wheezing or coughing. Heart: Regular rate and rhythm. Abdomen: Soft, nontender, no obvious distention. Musculoskeletal: No edema, cyanosis, or clubbing. Neuro: Alert, answering all questions appropriately. Cranial nerves grossly intact. Skin: No rashes or petechiae noted. Psych: Normal affect.  LAB RESULTS:  Lab Results  Component Value Date   NA 137 06/11/2019   K 3.2 (L) 06/11/2019   CL 105 06/11/2019   CO2 23 06/11/2019   GLUCOSE 110 (H) 06/11/2019   BUN 9 06/11/2019   CREATININE 0.65 06/11/2019   CALCIUM 9.2 06/11/2019   PROT 7.7 06/11/2019   ALBUMIN 4.2 06/11/2019   AST 18 06/11/2019   ALT 17 06/11/2019   ALKPHOS 67 06/11/2019   BILITOT 0.6 06/11/2019   GFRNONAA >60 06/11/2019   GFRAA >60 06/11/2019    Lab Results  Component Value Date   WBC 6.6 07/03/2020   NEUTROABS  4.0 07/03/2020   HGB 10.2 (L) 07/03/2020   HCT 32.6 (L) 07/03/2020   MCV 91.8 07/03/2020   PLT 234 07/03/2020   Lab Results  Component Value Date   IRON 38 07/03/2020   TIBC 392 07/03/2020   IRONPCTSAT 10 (L) 07/03/2020   Lab Results  Component Value Date   FERRITIN 6 (L) 07/03/2020     STUDIES: MM 3D SCREEN BREAST BILATERAL  Result Date: 06/12/2020 CLINICAL DATA:  Screening. EXAM: DIGITAL SCREENING BILATERAL MAMMOGRAM WITH TOMO AND CAD COMPARISON:  Previous exam(s). ACR Breast Density Category b: There are scattered areas of fibroglandular density. FINDINGS: There are no findings suspicious for malignancy. Images were processed with CAD. IMPRESSION: No mammographic evidence of malignancy. A result letter of this screening mammogram will be mailed directly to the patient. RECOMMENDATION: Screening mammogram in one year. (Code:SM-B-01Y)  BI-RADS CATEGORY  1: Negative. Electronically Signed   By: Fidela Salisbury M.D.   On: 06/12/2020 16:11    ASSESSMENT: Iron deficiency anemia.  PLAN:    1.  Iron deficiency anemia: Although patient remains asymptomatic, her hemoglobin and iron stores have trended down.  She had upper endoscopy on July 08, 2019 that revealed 1 angiectasia in the duodenum that was treated with argon plasma coagulation.  Colonoscopy on October 28, 2019 did not reveal any significant pathology.  Proceed with 510 mg IV Feraheme today.  Return to clinic in 1 week for a second infusion.  Patient will then return to clinic in 3 months with repeat laboratory work, further evaluation, and consideration of treatment if needed.   I spent a total of 30 minutes reviewing chart data, face-to-face evaluation with the patient, counseling and coordination of care as detailed above.  Patient expressed understanding and was in agreement with this plan. She also understands that She can call clinic at any time with any questions, concerns, or complaints.    Lloyd Huger, MD   07/04/2020 8:42 AM

## 2020-07-01 ENCOUNTER — Encounter (INDEPENDENT_AMBULATORY_CARE_PROVIDER_SITE_OTHER): Payer: PPO

## 2020-07-01 ENCOUNTER — Ambulatory Visit (INDEPENDENT_AMBULATORY_CARE_PROVIDER_SITE_OTHER): Payer: PPO | Admitting: Nurse Practitioner

## 2020-07-01 DIAGNOSIS — I700 Atherosclerosis of aorta: Secondary | ICD-10-CM | POA: Diagnosis not present

## 2020-07-01 DIAGNOSIS — J449 Chronic obstructive pulmonary disease, unspecified: Secondary | ICD-10-CM | POA: Diagnosis not present

## 2020-07-01 DIAGNOSIS — I10 Essential (primary) hypertension: Secondary | ICD-10-CM | POA: Diagnosis not present

## 2020-07-01 DIAGNOSIS — I739 Peripheral vascular disease, unspecified: Secondary | ICD-10-CM | POA: Diagnosis not present

## 2020-07-01 DIAGNOSIS — Z23 Encounter for immunization: Secondary | ICD-10-CM | POA: Diagnosis not present

## 2020-07-01 DIAGNOSIS — I779 Disorder of arteries and arterioles, unspecified: Secondary | ICD-10-CM | POA: Diagnosis not present

## 2020-07-02 ENCOUNTER — Other Ambulatory Visit: Payer: Self-pay

## 2020-07-02 DIAGNOSIS — D509 Iron deficiency anemia, unspecified: Secondary | ICD-10-CM

## 2020-07-02 NOTE — Progress Notes (Signed)
Patient called for pre assessment. She denies any concerns or questions at this time.

## 2020-07-03 ENCOUNTER — Other Ambulatory Visit: Payer: Self-pay

## 2020-07-03 ENCOUNTER — Inpatient Hospital Stay: Payer: PPO | Attending: Oncology

## 2020-07-03 ENCOUNTER — Inpatient Hospital Stay: Payer: PPO

## 2020-07-03 ENCOUNTER — Encounter: Payer: Self-pay | Admitting: Oncology

## 2020-07-03 ENCOUNTER — Inpatient Hospital Stay (HOSPITAL_BASED_OUTPATIENT_CLINIC_OR_DEPARTMENT_OTHER): Payer: PPO | Admitting: Oncology

## 2020-07-03 VITALS — BP 130/67 | HR 74 | Temp 97.9°F | Resp 18

## 2020-07-03 VITALS — BP 129/74 | HR 87 | Temp 97.6°F | Wt 131.3 lb

## 2020-07-03 DIAGNOSIS — D509 Iron deficiency anemia, unspecified: Secondary | ICD-10-CM

## 2020-07-03 LAB — CBC WITH DIFFERENTIAL/PLATELET
Abs Immature Granulocytes: 0.02 10*3/uL (ref 0.00–0.07)
Basophils Absolute: 0 10*3/uL (ref 0.0–0.1)
Basophils Relative: 1 %
Eosinophils Absolute: 0.3 10*3/uL (ref 0.0–0.5)
Eosinophils Relative: 4 %
HCT: 32.6 % — ABNORMAL LOW (ref 36.0–46.0)
Hemoglobin: 10.2 g/dL — ABNORMAL LOW (ref 12.0–15.0)
Immature Granulocytes: 0 %
Lymphocytes Relative: 26 %
Lymphs Abs: 1.7 10*3/uL (ref 0.7–4.0)
MCH: 28.7 pg (ref 26.0–34.0)
MCHC: 31.3 g/dL (ref 30.0–36.0)
MCV: 91.8 fL (ref 80.0–100.0)
Monocytes Absolute: 0.5 10*3/uL (ref 0.1–1.0)
Monocytes Relative: 8 %
Neutro Abs: 4 10*3/uL (ref 1.7–7.7)
Neutrophils Relative %: 61 %
Platelets: 234 10*3/uL (ref 150–400)
RBC: 3.55 MIL/uL — ABNORMAL LOW (ref 3.87–5.11)
RDW: 13.2 % (ref 11.5–15.5)
WBC: 6.6 10*3/uL (ref 4.0–10.5)
nRBC: 0 % (ref 0.0–0.2)

## 2020-07-03 LAB — IRON AND TIBC
Iron: 38 ug/dL (ref 28–170)
Saturation Ratios: 10 % — ABNORMAL LOW (ref 10.4–31.8)
TIBC: 392 ug/dL (ref 250–450)
UIBC: 354 ug/dL

## 2020-07-03 LAB — FERRITIN: Ferritin: 6 ng/mL — ABNORMAL LOW (ref 11–307)

## 2020-07-03 MED ORDER — SODIUM CHLORIDE 0.9 % IV SOLN
510.0000 mg | Freq: Once | INTRAVENOUS | Status: AC
  Administered 2020-07-03: 510 mg via INTRAVENOUS
  Filled 2020-07-03: qty 510

## 2020-07-03 MED ORDER — SODIUM CHLORIDE 0.9 % IV SOLN
Freq: Once | INTRAVENOUS | Status: AC
  Filled 2020-07-03: qty 250

## 2020-07-08 ENCOUNTER — Ambulatory Visit (INDEPENDENT_AMBULATORY_CARE_PROVIDER_SITE_OTHER): Payer: PPO

## 2020-07-08 ENCOUNTER — Other Ambulatory Visit: Payer: Self-pay

## 2020-07-08 ENCOUNTER — Encounter (INDEPENDENT_AMBULATORY_CARE_PROVIDER_SITE_OTHER): Payer: Self-pay | Admitting: Nurse Practitioner

## 2020-07-08 ENCOUNTER — Ambulatory Visit (INDEPENDENT_AMBULATORY_CARE_PROVIDER_SITE_OTHER): Payer: PPO | Admitting: Nurse Practitioner

## 2020-07-08 VITALS — BP 141/81 | HR 85 | Ht 61.0 in | Wt 132.0 lb

## 2020-07-08 DIAGNOSIS — I10 Essential (primary) hypertension: Secondary | ICD-10-CM

## 2020-07-08 DIAGNOSIS — I70213 Atherosclerosis of native arteries of extremities with intermittent claudication, bilateral legs: Secondary | ICD-10-CM | POA: Diagnosis not present

## 2020-07-08 DIAGNOSIS — M25559 Pain in unspecified hip: Secondary | ICD-10-CM | POA: Diagnosis not present

## 2020-07-08 DIAGNOSIS — Z720 Tobacco use: Secondary | ICD-10-CM

## 2020-07-08 DIAGNOSIS — I6523 Occlusion and stenosis of bilateral carotid arteries: Secondary | ICD-10-CM | POA: Diagnosis not present

## 2020-07-09 ENCOUNTER — Inpatient Hospital Stay: Payer: PPO

## 2020-07-09 VITALS — BP 131/70 | HR 84 | Temp 97.7°F | Resp 20

## 2020-07-09 DIAGNOSIS — D509 Iron deficiency anemia, unspecified: Secondary | ICD-10-CM | POA: Diagnosis not present

## 2020-07-09 MED ORDER — SODIUM CHLORIDE 0.9 % IV SOLN
510.0000 mg | Freq: Once | INTRAVENOUS | Status: AC
  Administered 2020-07-09: 510 mg via INTRAVENOUS
  Filled 2020-07-09: qty 510

## 2020-07-09 MED ORDER — SODIUM CHLORIDE 0.9 % IV SOLN
INTRAVENOUS | Status: DC
  Filled 2020-07-09: qty 250

## 2020-07-13 ENCOUNTER — Encounter (INDEPENDENT_AMBULATORY_CARE_PROVIDER_SITE_OTHER): Payer: Self-pay | Admitting: Nurse Practitioner

## 2020-07-13 NOTE — Progress Notes (Signed)
Subjective:    Patient ID: Jasmine Acosta, female    DOB: 1950/11/04, 69 y.o.   MRN: [id number] Chief Complaint  Patient presents with  . Follow-up    u/s follow up    The patient returns to the office for followup and review of the noninvasive studies. There have been no interval changes in lower extremity symptoms. No interval shortening of the patient's claudication distance or development of rest pain symptoms. No new ulcers or wounds have occurred since the last visit.  There have been no significant changes to the patient's overall health care.  The patient denies amaurosis fugax or recent TIA symptoms. There are no recent neurological changes noted. The patient denies history of DVT, PE or superficial thrombophlebitis. The patient denies recent episodes of angina or shortness of breath.   ABI Rt=0.99 and Lt=1.03  (previous ABI's Rt=1.01 and Lt=1.05) Duplex ultrasound of the triphasic tibial artery waveforms with good toe waveforms bilaterally  The patient is seen for follow up evaluation of carotid stenosis as well. The carotid stenosis followed by ultrasound.   The patient has had a previous right carotid endarterectomy.  The patient is taking enteric-coated aspirin 81 mg daily.  There is no history of migraine headaches. There is no history of seizures.    Carotid Duplex done today shows no evidence of stenosis in the right ACA just some minimal wall thickening.  The left ICA is consistent with a 1 to 39% stenosis.  The bilateral vertebral arteries demonstrate antegrade flow with normal flow hemodynamics in the bilateral subclavian arteries.  This consistent with previous studies done on 11/19/2019.   Review of Systems  Musculoskeletal: Positive for arthralgias, back pain, gait problem and joint swelling.  All other systems reviewed and are negative.      Objective:   Physical Exam Vitals reviewed.  HENT:     Head: Normocephalic.  Neck:     Vascular: No carotid  bruit.  Cardiovascular:     Rate and Rhythm: Normal rate and regular rhythm.     Pulses: Normal pulses.     Heart sounds: Normal heart sounds.  Pulmonary:     Effort: Pulmonary effort is normal.  Skin:    General: Skin is warm and dry.     Capillary Refill: Capillary refill takes less than 2 seconds.  Neurological:     Mental Status: She is alert and oriented to person, place, and time.  Psychiatric:        Mood and Affect: Mood normal.        Behavior: Behavior normal.        Thought Content: Thought content normal.        Judgment: Judgment normal.     BP (!) 141/81   Pulse 85   Ht 5\' 1"  (1.549 m)   Wt 132 lb (59.9 kg)   BMI 24.94 kg/m   Past Medical History:  Diagnosis Date  . Carotid artery occlusion   . COPD (chronic obstructive pulmonary disease) ([place]) 07/06/2015  . Coronary artery disease   . Headache   . Hypertension   . Hyperthyroidism    ablated with iodine  . Peripheral vascular disease ([place])    Bilateral Carotid Artery Disease  . Personal history of arterial venous malformation (AVM)   . Pseudoclaudication     Social History   Socioeconomic History  . Marital status: Widowed    Spouse name: Not on file  . Number of children: Not on file  . Years of  education: Not on file  . Highest education level: Not on file  Occupational History  . Not on file  Tobacco Use  . Smoking status: Former Smoker    Packs/day: 1.00    Years: 50.00    Pack years: 50.00    Types: Cigarettes    Quit date: 06/14/2018    Years since quitting: 2.0  . Smokeless tobacco: Never Used  . Tobacco comment: since age 59  Vaping Use  . Vaping Use: Never used  Substance and Sexual Activity  . Alcohol use: No  . Drug use: No  . Sexual activity: Not Currently    Birth control/protection: Surgical  Other Topics Concern  . Not on file  Social History Narrative  . Not on file   Social Determinants of Health   Financial Resource Strain:   . Difficulty of Paying Living  Expenses: Not on file  Food Insecurity:   . Worried About Charity fundraiser in the Last Year: Not on file  . Ran Out of Food in the Last Year: Not on file  Transportation Needs:   . Lack of Transportation (Medical): Not on file  . Lack of Transportation (Non-Medical): Not on file  Physical Activity:   . Days of Exercise per Week: Not on file  . Minutes of Exercise per Session: Not on file  Stress:   . Feeling of Stress : Not on file  Social Connections:   . Frequency of Communication with Friends and Family: Not on file  . Frequency of Social Gatherings with Friends and Family: Not on file  . Attends Religious Services: Not on file  . Active Member of Clubs or Organizations: Not on file  . Attends Archivist Meetings: Not on file  . Marital Status: Not on file  Intimate Partner Violence:   . Fear of Current or Ex-Partner: Not on file  . Emotionally Abused: Not on file  . Physically Abused: Not on file  . Sexually Abused: Not on file    Past Surgical History:  Procedure Laterality Date  . ABDOMINAL HYSTERECTOMY     heavy bleeding  . CATARACT EXTRACTION W/PHACO Right 10/09/2018   Procedure: CATARACT EXTRACTION PHACO AND INTRAOCULAR LENS PLACEMENT ([place]) RIGHT;  Surgeon: Leandrew Koyanagi, MD;  Location: [place];  Service: Ophthalmology;  Laterality: Right;  . CATARACT EXTRACTION W/PHACO Left 01/01/2019   Procedure: CATARACT EXTRACTION PHACO AND INTRAOCULAR LENS PLACEMENT ([place])  LEFT;  Surgeon: Leandrew Koyanagi, MD;  Location: [place];  Service: Ophthalmology;  Laterality: Left;  . COLONOSCOPY WITH PROPOFOL N/A 10/30/2019   Procedure: COLONOSCOPY WITH PROPOFOL;  Surgeon: Toledo, Benay Pike, MD;  Location: [facility] ENDOSCOPY;  Service: Gastroenterology;  Laterality: N/A;  . CORONARY ANGIOPLASTY    . ENDARTERECTOMY Right 06/14/2018   Procedure: ENDARTERECTOMY CAROTID;  Surgeon: Algernon Huxley, MD;  Location: [facility];  Service: Vascular;  Laterality:  Right;  . ESOPHAGOGASTRODUODENOSCOPY N/A 07/05/2019   Procedure: ESOPHAGOGASTRODUODENOSCOPY (EGD);  Surgeon: Lollie Sails, MD;  Location: [facility] ENDOSCOPY;  Service: Endoscopy;  Laterality: N/A;  . ESOPHAGOGASTRODUODENOSCOPY (EGD) WITH PROPOFOL N/A 07/08/2019   Procedure: ESOPHAGOGASTRODUODENOSCOPY (EGD) WITH PROPOFOL;  Surgeon: Lollie Sails, MD;  Location: [facility] ENDOSCOPY;  Service: Endoscopy;  Laterality: N/A;  . EYE SURGERY    . hallux vagus correction Right   . HALLUX VALGUS AUSTIN Left 07/01/2016   Procedure: HALLUX VALGUS AUSTIN left foot;  Surgeon: Sharlotte Alamo, DPM;  Location: [facility];  Service: Podiatry;  Laterality: Left;  . LOWER  EXTREMITY ANGIOGRAPHY Right 11/05/2018   Procedure: LOWER EXTREMITY ANGIOGRAPHY;  Surgeon: Algernon Huxley, MD;  Location: [place] CV LAB;  Service: Cardiovascular;  Laterality: Right;    Family History  Problem Relation Age of Onset  . Diabetes Mother   . Emphysema Father   . Heart disease Sister   . Heart disease Brother   . Breast cancer Neg Hx   . Ovarian cancer Neg Hx   . Colon cancer Neg Hx     Allergies  Allergen Reactions  . Lisinopril Other (See Comments)    Other reaction(s): Angioedema Of tongue only  . Codeine Nausea And Vomiting       Assessment & Plan:   1. Bilateral carotid artery stenosis Recommend:  Given the patient's asymptomatic subcritical stenosis no further invasive testing or surgery at this time.  Duplex ultrasound shows 1-39% stenosis bilaterally.  Continue antiplatelet therapy as prescribed Continue management of CAD, HTN and Hyperlipidemia Healthy heart diet,  encouraged exercise at least 4 times per week Follow up in 12 months with duplex ultrasound and physical exam   2. Atherosclerosis of native artery of both lower extremities with intermittent claudication (HCC)  Recommend:  The patient has evidence of atherosclerosis of the lower extremities with minimal claudication.  The patient does not  voice lifestyle limiting changes at this point in time.  Noninvasive studies do not suggest clinically significant change.  No invasive studies, angiography or surgery at this time The patient should continue walking and begin a more formal exercise program.  The patient should continue antiplatelet therapy and aggressive treatment of the lipid abnormalities  No changes in the patient's medications at this time  The patient should continue wearing graduated compression socks 10-15 mmHg strength to control the mild edema.    3. Tobacco user Smoking cessation was discussed, 3-10 minutes spent on this topic specifically   4. Hip pain The patient has a number of issues that may be more so related to joint pain such as in her ankles, shoulders and hips.  Based on this we will send the patient to orthopedic surgery for further work-up of these areas. - Ambulatory referral to Orthopedic Surgery  5. Essential hypertension Continue antihypertensive medications as already ordered, these medications have been reviewed and there are no changes at this time.    Current Outpatient Medications on File Prior to Visit  Medication Sig Dispense Refill  . albuterol (VENTOLIN HFA) 108 (90 Base) MCG/ACT inhaler Inhale 2 puffs into the lungs every 6 (six) hours as needed for wheezing or shortness of breath. 8 g 0  . amLODipine (NORVASC) 5 MG tablet Take 5 mg by mouth daily.   11  . aspirin EC 81 MG tablet Take 1 tablet (81 mg total) by mouth daily. 150 tablet 2  . clopidogrel (PLAVIX) 75 MG tablet Take 1 tablet (75 mg total) by mouth daily with breakfast. 30 tablet 11  . gabapentin (NEURONTIN) 100 MG capsule Take 100 mg by mouth in the morning and at bedtime.    . Multiple Vitamin (MULTIVITAMIN) capsule Take 1 capsule by mouth daily.    . potassium chloride (K-DUR,KLOR-CON) 10 MEQ tablet Take 10 mEq by mouth 2 (two) times daily.    . rosuvastatin (CRESTOR) 40 MG tablet Take 40 mg by mouth daily.     No  current facility-administered medications on file prior to visit.    There are no Patient Instructions on file for this visit. No follow-ups on file.   Arna Medici  Earley Favor, NP

## 2020-07-29 DIAGNOSIS — I739 Peripheral vascular disease, unspecified: Secondary | ICD-10-CM | POA: Diagnosis not present

## 2020-07-29 DIAGNOSIS — I779 Disorder of arteries and arterioles, unspecified: Secondary | ICD-10-CM | POA: Diagnosis not present

## 2020-07-29 DIAGNOSIS — I208 Other forms of angina pectoris: Secondary | ICD-10-CM | POA: Diagnosis not present

## 2020-07-29 DIAGNOSIS — R011 Cardiac murmur, unspecified: Secondary | ICD-10-CM | POA: Diagnosis not present

## 2020-07-29 DIAGNOSIS — Z87891 Personal history of nicotine dependence: Secondary | ICD-10-CM | POA: Diagnosis not present

## 2020-07-29 DIAGNOSIS — I70219 Atherosclerosis of native arteries of extremities with intermittent claudication, unspecified extremity: Secondary | ICD-10-CM | POA: Diagnosis not present

## 2020-07-29 DIAGNOSIS — J449 Chronic obstructive pulmonary disease, unspecified: Secondary | ICD-10-CM | POA: Diagnosis not present

## 2020-07-29 DIAGNOSIS — R0602 Shortness of breath: Secondary | ICD-10-CM | POA: Diagnosis not present

## 2020-07-29 DIAGNOSIS — I10 Essential (primary) hypertension: Secondary | ICD-10-CM | POA: Diagnosis not present

## 2020-10-01 ENCOUNTER — Other Ambulatory Visit: Payer: Self-pay

## 2020-10-01 ENCOUNTER — Inpatient Hospital Stay: Payer: PPO | Attending: Oncology

## 2020-10-01 DIAGNOSIS — D509 Iron deficiency anemia, unspecified: Secondary | ICD-10-CM | POA: Diagnosis not present

## 2020-10-01 LAB — CBC WITH DIFFERENTIAL/PLATELET
Abs Immature Granulocytes: 0.01 10*3/uL (ref 0.00–0.07)
Basophils Absolute: 0.1 10*3/uL (ref 0.0–0.1)
Basophils Relative: 1 %
Eosinophils Absolute: 0.3 10*3/uL (ref 0.0–0.5)
Eosinophils Relative: 5 %
HCT: 35.2 % — ABNORMAL LOW (ref 36.0–46.0)
Hemoglobin: 11.3 g/dL — ABNORMAL LOW (ref 12.0–15.0)
Immature Granulocytes: 0 %
Lymphocytes Relative: 28 %
Lymphs Abs: 1.6 10*3/uL (ref 0.7–4.0)
MCH: 30.1 pg (ref 26.0–34.0)
MCHC: 32.1 g/dL (ref 30.0–36.0)
MCV: 93.9 fL (ref 80.0–100.0)
Monocytes Absolute: 0.5 10*3/uL (ref 0.1–1.0)
Monocytes Relative: 8 %
Neutro Abs: 3.3 10*3/uL (ref 1.7–7.7)
Neutrophils Relative %: 58 %
Platelets: 211 10*3/uL (ref 150–400)
RBC: 3.75 MIL/uL — ABNORMAL LOW (ref 3.87–5.11)
RDW: 12.6 % (ref 11.5–15.5)
WBC: 5.8 10*3/uL (ref 4.0–10.5)
nRBC: 0 % (ref 0.0–0.2)

## 2020-10-01 LAB — IRON AND TIBC
Iron: 57 ug/dL (ref 28–170)
Saturation Ratios: 16 % (ref 10.4–31.8)
TIBC: 363 ug/dL (ref 250–450)
UIBC: 306 ug/dL

## 2020-10-01 LAB — FERRITIN: Ferritin: 11 ng/mL (ref 11–307)

## 2020-10-02 ENCOUNTER — Other Ambulatory Visit: Payer: PPO

## 2020-10-02 ENCOUNTER — Other Ambulatory Visit: Payer: Self-pay | Admitting: *Deleted

## 2020-10-02 ENCOUNTER — Inpatient Hospital Stay: Payer: PPO

## 2020-10-02 ENCOUNTER — Inpatient Hospital Stay (HOSPITAL_BASED_OUTPATIENT_CLINIC_OR_DEPARTMENT_OTHER): Payer: PPO | Admitting: Oncology

## 2020-10-02 VITALS — BP 121/69 | HR 71 | Temp 98.0°F | Resp 18 | Wt 140.7 lb

## 2020-10-02 DIAGNOSIS — D509 Iron deficiency anemia, unspecified: Secondary | ICD-10-CM

## 2020-10-02 MED ORDER — SODIUM CHLORIDE 0.9 % IV SOLN
Freq: Once | INTRAVENOUS | Status: AC
  Filled 2020-10-02: qty 250

## 2020-10-02 MED ORDER — SODIUM CHLORIDE 0.9 % IV SOLN
510.0000 mg | Freq: Once | INTRAVENOUS | Status: AC
  Administered 2020-10-02: 510 mg via INTRAVENOUS
  Filled 2020-10-02: qty 510

## 2020-10-02 NOTE — Progress Notes (Signed)
Here for IDA. Denies any signs of bleeding. Denies any weakness, dizziness or dyspnea. Feels well.

## 2020-10-02 NOTE — Progress Notes (Signed)
[place]  [phone] Fax:([phone]) [phone]  ID: Jasmine Acosta OB: 03-04-1951  MR#: [id number]  GLO#:[id number]  Patient Care Team: Kirk Ruths, MD as PCP - General (Internal Medicine) Lloyd Huger, MD as Consulting Physician (Oncology)  CHIEF COMPLAINT: Iron deficiency anemia.  INTERVAL HISTORY: Patient returns to clinic today for repeat laboratory work, further evaluation, and consideration of additional IV Feraheme.  She currently feels well and is asymptomatic. Admits to feeling a little bit better post her last iron infusion. She does not complain of any weakness or fatigue.  She has no neurologic complaints. She denies any recent fevers or illnesses.  She has a good appetite and denies weight loss.  She has no chest pain, shortness of breath, cough, or hemoptysis.  She denies any nausea, vomiting, constipation, or diarrhea.  She has no melena or hematochezia.  She has no urinary complaints.  Patient feels at her baseline offers no specific complaints today.   REVIEW OF SYSTEMS:   Review of Systems  Constitutional: Negative.  Negative for fever, malaise/fatigue and weight loss.  Respiratory: Negative.  Negative for cough, hemoptysis and shortness of breath.   Cardiovascular: Negative.  Negative for chest pain and leg swelling.  Gastrointestinal: Negative.  Negative for abdominal pain, blood in stool and melena.  Genitourinary: Negative.  Negative for hematuria.  Musculoskeletal: Negative.  Negative for back pain.  Skin: Negative.  Negative for rash.  Neurological: Negative.  Negative for dizziness, focal weakness, weakness and headaches.  Psychiatric/Behavioral: Negative.  The patient is not nervous/anxious.     As per HPI. Otherwise, a complete review of systems is negative.  PAST MEDICAL HISTORY: Past Medical History:  Diagnosis Date  . Carotid artery occlusion   . COPD (chronic obstructive pulmonary disease) ([place]) 07/06/2015  .  Coronary artery disease   . Headache   . Hypertension   . Hyperthyroidism    ablated with iodine  . Peripheral vascular disease ([place])    Bilateral Carotid Artery Disease  . Personal history of arterial venous malformation (AVM)   . Pseudoclaudication     PAST SURGICAL HISTORY: Past Surgical History:  Procedure Laterality Date  . ABDOMINAL HYSTERECTOMY     heavy bleeding  . CATARACT EXTRACTION W/PHACO Right 10/09/2018   Procedure: CATARACT EXTRACTION PHACO AND INTRAOCULAR LENS PLACEMENT ([place]) RIGHT;  Surgeon: Leandrew Koyanagi, MD;  Location: [place];  Service: Ophthalmology;  Laterality: Right;  . CATARACT EXTRACTION W/PHACO Left 01/01/2019   Procedure: CATARACT EXTRACTION PHACO AND INTRAOCULAR LENS PLACEMENT ([place])  LEFT;  Surgeon: Leandrew Koyanagi, MD;  Location: [place];  Service: Ophthalmology;  Laterality: Left;  . COLONOSCOPY WITH PROPOFOL N/A 10/30/2019   Procedure: COLONOSCOPY WITH PROPOFOL;  Surgeon: Toledo, Benay Pike, MD;  Location: [facility] ENDOSCOPY;  Service: Gastroenterology;  Laterality: N/A;  . CORONARY ANGIOPLASTY    . ENDARTERECTOMY Right 06/14/2018   Procedure: ENDARTERECTOMY CAROTID;  Surgeon: Algernon Huxley, MD;  Location: [facility];  Service: Vascular;  Laterality: Right;  . ESOPHAGOGASTRODUODENOSCOPY N/A 07/05/2019   Procedure: ESOPHAGOGASTRODUODENOSCOPY (EGD);  Surgeon: Lollie Sails, MD;  Location: [facility] ENDOSCOPY;  Service: Endoscopy;  Laterality: N/A;  . ESOPHAGOGASTRODUODENOSCOPY (EGD) WITH PROPOFOL N/A 07/08/2019   Procedure: ESOPHAGOGASTRODUODENOSCOPY (EGD) WITH PROPOFOL;  Surgeon: Lollie Sails, MD;  Location: [facility] ENDOSCOPY;  Service: Endoscopy;  Laterality: N/A;  . EYE SURGERY    . hallux vagus correction Right   . HALLUX VALGUS AUSTIN Left 07/01/2016   Procedure: HALLUX VALGUS AUSTIN left foot;  Surgeon: Sherren Mocha  Cleda Mccreedy, DPM;  Location: [facility];  Service: Podiatry;  Laterality: Left;  . LOWER EXTREMITY ANGIOGRAPHY Right 11/05/2018    Procedure: LOWER EXTREMITY ANGIOGRAPHY;  Surgeon: Algernon Huxley, MD;  Location: [place] [facility];  Service: Cardiovascular;  Laterality: Right;    FAMILY HISTORY: Family History  Problem Relation Age of Onset  . Diabetes Mother   . Emphysema Father   . Heart disease Sister   . Heart disease Brother   . Breast cancer Neg Hx   . Ovarian cancer Neg Hx   . Colon cancer Neg Hx     ADVANCED DIRECTIVES (Y/N):  N  HEALTH MAINTENANCE: Social History   Tobacco Use  . Smoking status: Former Smoker    Packs/day: 1.00    Years: 50.00    Pack years: 50.00    Types: Cigarettes    Quit date: 06/14/2018    Years since quitting: 2.3  . Smokeless tobacco: Never Used  . Tobacco comment: since age 19  Vaping Use  . Vaping Use: Never used  Substance Use Topics  . Alcohol use: No  . Drug use: No     Colonoscopy:  PAP:  Bone density:  Lipid panel:  Allergies  Allergen Reactions  . Lisinopril Other (See Comments)    Other reaction(s): Angioedema Of tongue only  . Codeine Nausea And Vomiting    Current Outpatient Medications  Medication Sig Dispense Refill  . albuterol (VENTOLIN HFA) 108 (90 Base) MCG/ACT inhaler Inhale 2 puffs into the lungs every 6 (six) hours as needed for wheezing or shortness of breath. 8 g 0  . amLODipine (NORVASC) 5 MG tablet Take 5 mg by mouth daily.   11  . aspirin EC 81 MG tablet Take 1 tablet (81 mg total) by mouth daily. 150 tablet 2  . clopidogrel (PLAVIX) 75 MG tablet Take 1 tablet (75 mg total) by mouth daily with breakfast. 30 tablet 11  . gabapentin (NEURONTIN) 100 MG capsule Take 100 mg by mouth in the morning and at bedtime.    . Multiple Vitamin (MULTIVITAMIN) capsule Take 1 capsule by mouth daily.    . potassium chloride (K-DUR,KLOR-CON) 10 MEQ tablet Take 10 mEq by mouth 2 (two) times daily.    . rosuvastatin (CRESTOR) 40 MG tablet Take 40 mg by mouth daily.     No current facility-administered medications for this visit.     OBJECTIVE: There were no vitals filed for this visit.   There is no height or weight on file to calculate BMI.    ECOG FS:0 - Asymptomatic  Physical Exam Constitutional:      General: Vital signs are normal.     Appearance: Normal appearance.  HENT:     Head: Normocephalic and atraumatic.  Eyes:     Pupils: Pupils are equal, round, and reactive to light.  Cardiovascular:     Rate and Rhythm: Normal rate and regular rhythm.     Heart sounds: Normal heart sounds. No murmur heard.   Pulmonary:     Effort: Pulmonary effort is normal.     Breath sounds: Normal breath sounds. No wheezing.  Abdominal:     General: Bowel sounds are normal. There is no distension.     Palpations: Abdomen is soft.     Tenderness: There is no abdominal tenderness.  Musculoskeletal:        General: No edema. Normal range of motion.     Cervical back: Normal range of motion.  Skin:    General:  Skin is warm and dry.     Findings: No rash.  Neurological:     Mental Status: She is alert and oriented to person, place, and time.  Psychiatric:        Judgment: Judgment normal.     LAB RESULTS:  Lab Results  Component Value Date   NA 137 06/11/2019   K 3.2 (L) 06/11/2019   CL 105 06/11/2019   CO2 23 06/11/2019   GLUCOSE 110 (H) 06/11/2019   BUN 9 06/11/2019   CREATININE 0.65 06/11/2019   CALCIUM 9.2 06/11/2019   PROT 7.7 06/11/2019   ALBUMIN 4.2 06/11/2019   AST 18 06/11/2019   ALT 17 06/11/2019   ALKPHOS 67 06/11/2019   BILITOT 0.6 06/11/2019   GFRNONAA >60 06/11/2019   GFRAA >60 06/11/2019    Lab Results  Component Value Date   WBC 5.8 10/01/2020   NEUTROABS 3.3 10/01/2020   HGB 11.3 (L) 10/01/2020   HCT 35.2 (L) 10/01/2020   MCV 93.9 10/01/2020   PLT 211 10/01/2020   Lab Results  Component Value Date   IRON 57 10/01/2020   TIBC 363 10/01/2020   IRONPCTSAT 16 10/01/2020   Lab Results  Component Value Date   FERRITIN 11 10/01/2020     STUDIES: No results  found.  ASSESSMENT: Iron deficiency anemia.  PLAN:    1.  Iron deficiency anemia:  -Patient last received IV iron on 07/03/20 and 07/09/2020 -Labs from 07/03/2020 showed a ferritin of 6 iron saturations of 10% and a hemoglobin of 10.2. -She was asymptomatic -Had upper endoscopy on 07/08/2019 which showed 1 angiectasia in the duodenum that was treated. -Colonoscopy from 10/28/2019 was negative. --Unable to tolerate oral iron secondary to constipation -Labs from 10/01/2020 show a hemoglobin of 11.3, ferritin of 11 and saturations of 16%. --Labs have improved some but iron stores are at the low end of normal.    Disposition: -Proceed with IV iron X 2 doses today and next week.  --RTC in 3 months with labs, assessment and possible IV iron.    Greater than 50% was spent in counseling and coordination of care with this patient including but not limited to discussion of the relevant topics above (See A&P) including, but not limited to diagnosis and management of acute and chronic medical conditions.   Patient expressed understanding and was in agreement with this plan. She also understands that She can call clinic at any time with any questions, concerns, or complaints.    Jacquelin Hawking, NP   10/02/2020 1:19 PM

## 2020-10-02 NOTE — Progress Notes (Signed)
Pt tolerated infusion well. No s/s of distress or reaction noted. Pt and VS stable at discharge.  

## 2020-10-09 ENCOUNTER — Inpatient Hospital Stay: Payer: PPO

## 2020-10-09 VITALS — BP 125/67 | HR 71 | Temp 97.0°F

## 2020-10-09 DIAGNOSIS — D509 Iron deficiency anemia, unspecified: Secondary | ICD-10-CM | POA: Diagnosis not present

## 2020-10-09 MED ORDER — SODIUM CHLORIDE 0.9 % IV SOLN
510.0000 mg | Freq: Once | INTRAVENOUS | Status: AC
  Administered 2020-10-09: 11:00:00 510 mg via INTRAVENOUS
  Filled 2020-10-09: qty 510

## 2020-10-09 MED ORDER — SODIUM CHLORIDE 0.9 % IV SOLN
Freq: Once | INTRAVENOUS | Status: AC
  Filled 2020-10-09: qty 250

## 2020-10-09 NOTE — Progress Notes (Signed)
Pt tolerated infusion well with no complications. VSS. Pt stable for discharge. RN educated pt on the importance of notifying the clinic if any complications occur, pt verbalized understanding and all questions answered at this time.   Zoya Sprecher [employer]

## 2020-11-20 ENCOUNTER — Ambulatory Visit (INDEPENDENT_AMBULATORY_CARE_PROVIDER_SITE_OTHER): Payer: PPO | Admitting: Vascular Surgery

## 2020-11-20 ENCOUNTER — Encounter (INDEPENDENT_AMBULATORY_CARE_PROVIDER_SITE_OTHER): Payer: PPO

## 2020-12-24 DIAGNOSIS — J449 Chronic obstructive pulmonary disease, unspecified: Secondary | ICD-10-CM | POA: Diagnosis not present

## 2020-12-24 DIAGNOSIS — I779 Disorder of arteries and arterioles, unspecified: Secondary | ICD-10-CM | POA: Diagnosis not present

## 2020-12-24 DIAGNOSIS — I700 Atherosclerosis of aorta: Secondary | ICD-10-CM | POA: Diagnosis not present

## 2020-12-26 NOTE — Progress Notes (Signed)
[place]  [phone] Fax:([phone]  ID: Jasmine Acosta OB: April 16, 1951  MR#: [id number]  GGY#:[id number]  Patient Care Team: Kirk Ruths, MD as PCP - General (Internal Medicine) Lloyd Huger, MD as Consulting Physician (Oncology)  CHIEF COMPLAINT: Iron deficiency anemia.  INTERVAL HISTORY: Patient returns to clinic today for repeat laboratory work, further evaluation, and consideration of IV Feraheme.  She has chronic weakness and fatigue, but otherwise feels well.  She has no neurologic complaints. She denies any recent fevers or illnesses.  She has a good appetite and denies weight loss.  She has no chest pain, shortness of breath, cough, or hemoptysis.  She denies any nausea, vomiting, constipation, or diarrhea.  She has no melena or hematochezia.  She has no urinary complaints.  Patient offers no further specific complaints today.  REVIEW OF SYSTEMS:   Review of Systems  Constitutional: Positive for malaise/fatigue. Negative for fever and weight loss.  Respiratory: Negative.  Negative for cough, hemoptysis and shortness of breath.   Cardiovascular: Negative.  Negative for chest pain and leg swelling.  Gastrointestinal: Negative.  Negative for abdominal pain, blood in stool and melena.  Genitourinary: Negative.  Negative for hematuria.  Musculoskeletal: Negative.  Negative for back pain.  Skin: Negative.  Negative for rash.  Neurological: Positive for weakness. Negative for dizziness, focal weakness and headaches.  Psychiatric/Behavioral: Negative.  The patient is not nervous/anxious.     As per HPI. Otherwise, a complete review of systems is negative.  PAST MEDICAL HISTORY: Past Medical History:  Diagnosis Date   Carotid artery occlusion    COPD (chronic obstructive pulmonary disease) ([place]) 07/06/2015   Coronary artery disease    Headache    Hypertension    Hyperthyroidism    ablated with iodine   Peripheral  vascular disease (HCC)    Bilateral Carotid Artery Disease   Personal history of arterial venous malformation (AVM)    Pseudoclaudication     PAST SURGICAL HISTORY: Past Surgical History:  Procedure Laterality Date   ABDOMINAL HYSTERECTOMY     heavy bleeding   CATARACT EXTRACTION W/PHACO Right 10/09/2018   Procedure: CATARACT EXTRACTION PHACO AND INTRAOCULAR LENS PLACEMENT ([place]) RIGHT;  Surgeon: Leandrew Koyanagi, MD;  Location: [place];  Service: Ophthalmology;  Laterality: Right;   CATARACT EXTRACTION W/PHACO Left 01/01/2019   Procedure: CATARACT EXTRACTION PHACO AND INTRAOCULAR LENS PLACEMENT ([place])  LEFT;  Surgeon: Leandrew Koyanagi, MD;  Location: [place];  Service: Ophthalmology;  Laterality: Left;   COLONOSCOPY WITH PROPOFOL N/A 10/30/2019   Procedure: COLONOSCOPY WITH PROPOFOL;  Surgeon: Toledo, Benay Pike, MD;  Location: [facility] ENDOSCOPY;  Service: Gastroenterology;  Laterality: N/A;   CORONARY ANGIOPLASTY     ENDARTERECTOMY Right 06/14/2018   Procedure: ENDARTERECTOMY CAROTID;  Surgeon: Algernon Huxley, MD;  Location: [facility];  Service: Vascular;  Laterality: Right;   ESOPHAGOGASTRODUODENOSCOPY N/A 07/05/2019   Procedure: ESOPHAGOGASTRODUODENOSCOPY (EGD);  Surgeon: Lollie Sails, MD;  Location: [facility] ENDOSCOPY;  Service: Endoscopy;  Laterality: N/A;   ESOPHAGOGASTRODUODENOSCOPY (EGD) WITH PROPOFOL N/A 07/08/2019   Procedure: ESOPHAGOGASTRODUODENOSCOPY (EGD) WITH PROPOFOL;  Surgeon: Lollie Sails, MD;  Location: [facility] ENDOSCOPY;  Service: Endoscopy;  Laterality: N/A;   EYE SURGERY     hallux vagus correction Right    HALLUX VALGUS AUSTIN Left 07/01/2016   Procedure: HALLUX VALGUS AUSTIN left foot;  Surgeon: Sharlotte Alamo, DPM;  Location: [facility];  Service: Podiatry;  Laterality: Left;   LOWER EXTREMITY ANGIOGRAPHY Right 11/05/2018   Procedure: LOWER  EXTREMITY ANGIOGRAPHY;  Surgeon: Algernon Huxley, MD;  Location: [place] CV [facility];  Service:  Cardiovascular;  Laterality: Right;    FAMILY HISTORY: Family History  Problem Relation Age of Onset   Diabetes Mother    Emphysema Father    Heart disease Sister    Heart disease Brother    Breast cancer Neg Hx    Ovarian cancer Neg Hx    Colon cancer Neg Hx     ADVANCED DIRECTIVES (Y/N):  N  HEALTH MAINTENANCE: Social History   Tobacco Use   Smoking status: Former Smoker    Packs/day: 1.00    Years: 50.00    Pack years: 50.00    Types: Cigarettes    Quit date: 06/14/2018    Years since quitting: 2.5   Smokeless tobacco: Never Used   Tobacco comment: since age 60  Vaping Use   Vaping Use: Never used  Substance Use Topics   Alcohol use: No   Drug use: No     Colonoscopy:  PAP:  Bone density:  Lipid panel:  Allergies  Allergen Reactions   Lisinopril Other (See Comments)    Other reaction(s): Angioedema Of tongue only   Codeine Nausea And Vomiting    Current Outpatient Medications  Medication Sig Dispense Refill   amLODipine (NORVASC) 5 MG tablet Take 5 mg by mouth daily.   11   aspirin EC 81 MG tablet Take 1 tablet (81 mg total) by mouth daily. 150 tablet 2   clopidogrel (PLAVIX) 75 MG tablet Take 1 tablet (75 mg total) by mouth daily with breakfast. 30 tablet 11   gabapentin (NEURONTIN) 100 MG capsule Take 100 mg by mouth in the morning and at bedtime.     meloxicam (MOBIC) 15 MG tablet Take by mouth.     Multiple Vitamin (MULTIVITAMIN) capsule Take 1 capsule by mouth daily.     potassium chloride (K-DUR,KLOR-CON) 10 MEQ tablet Take 10 mEq by mouth daily.     rosuvastatin (CRESTOR) 40 MG tablet Take 40 mg by mouth daily.     topiramate (TOPAMAX) 50 MG tablet Take 1 tablet by mouth 2 (two) times daily.     No current facility-administered medications for this visit.    OBJECTIVE: Vitals:   12/31/20 1343  BP: 128/65  Pulse: 86  Resp: 18  Temp: 97.8 F (36.6 C)  SpO2: 98%     Body mass index is 25.81 kg/m.    ECOG FS:0 -  Asymptomatic  General: Well-developed, well-nourished, no acute distress. Eyes: Pink conjunctiva, anicteric sclera. HEENT: Normocephalic, moist mucous membranes. Lungs: No audible wheezing or coughing. Heart: Regular rate and rhythm. Abdomen: Soft, nontender, no obvious distention. Musculoskeletal: No edema, cyanosis, or clubbing. Neuro: Alert, answering all questions appropriately. Cranial nerves grossly intact. Skin: No rashes or petechiae noted. Psych: Normal affect.   LAB RESULTS:  Lab Results  Component Value Date   NA 137 06/11/2019   K 3.2 (L) 06/11/2019   CL 105 06/11/2019   CO2 23 06/11/2019   GLUCOSE 110 (H) 06/11/2019   BUN 9 06/11/2019   CREATININE 0.65 06/11/2019   CALCIUM 9.2 06/11/2019   PROT 7.7 06/11/2019   ALBUMIN 4.2 06/11/2019   AST 18 06/11/2019   ALT 17 06/11/2019   ALKPHOS 67 06/11/2019   BILITOT 0.6 06/11/2019   GFRNONAA >60 06/11/2019   GFRAA >60 06/11/2019    Lab Results  Component Value Date   WBC 7.8 12/30/2020   NEUTROABS 5.2 12/30/2020   HGB 11.4 (  L) 12/30/2020   HCT 36.3 12/30/2020   MCV 97.8 12/30/2020   PLT 225 12/30/2020   Lab Results  Component Value Date   IRON 42 12/30/2020   TIBC 312 12/30/2020   IRONPCTSAT 14 12/30/2020   Lab Results  Component Value Date   FERRITIN 43 12/30/2020     STUDIES: No results found.  ASSESSMENT: Iron deficiency anemia.  PLAN:    1.  Iron deficiency anemia: Patient's hemoglobin remains decreased, but improved to 11.4.  Iron stores are within normal limits. She had upper endoscopy on July 08, 2019 that revealed 1 angiectasia in the duodenum that was treated with argon plasma coagulation.  Colonoscopy on October 28, 2019 did not reveal any significant pathology.  She does not require additional Feraheme today.  Patient last received treatment on October 09, 2020.  Return to clinic in 3 months with repeat laboratory work, further evaluation, and consideration of additional treatment if  needed.    I spent a total of 20 minutes reviewing chart data, face-to-face evaluation with the patient, counseling and coordination of care as detailed above.   Patient expressed understanding and was in agreement with this plan. She also understands that She can call clinic at any time with any questions, concerns, or complaints.    Lloyd Huger, MD   01/01/2021 7:36 AM

## 2020-12-27 IMAGING — MG DIGITAL SCREENING BILAT W/ TOMO W/ CAD
8 series · 8 of 24 positions shown · non-contrast
Comparison: Previous exam(s).

CLINICAL DATA: Screening.

EXAM:
DIGITAL SCREENING BILATERAL MAMMOGRAM WITH TOMO AND CAD

[L MLO synth-2D]
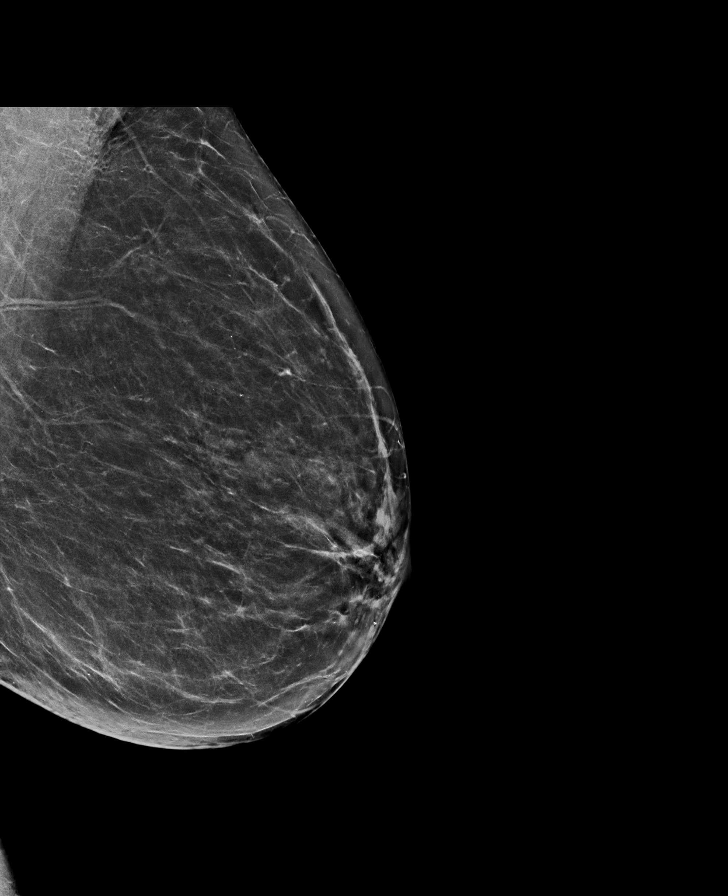

[R MLO synth-2D]
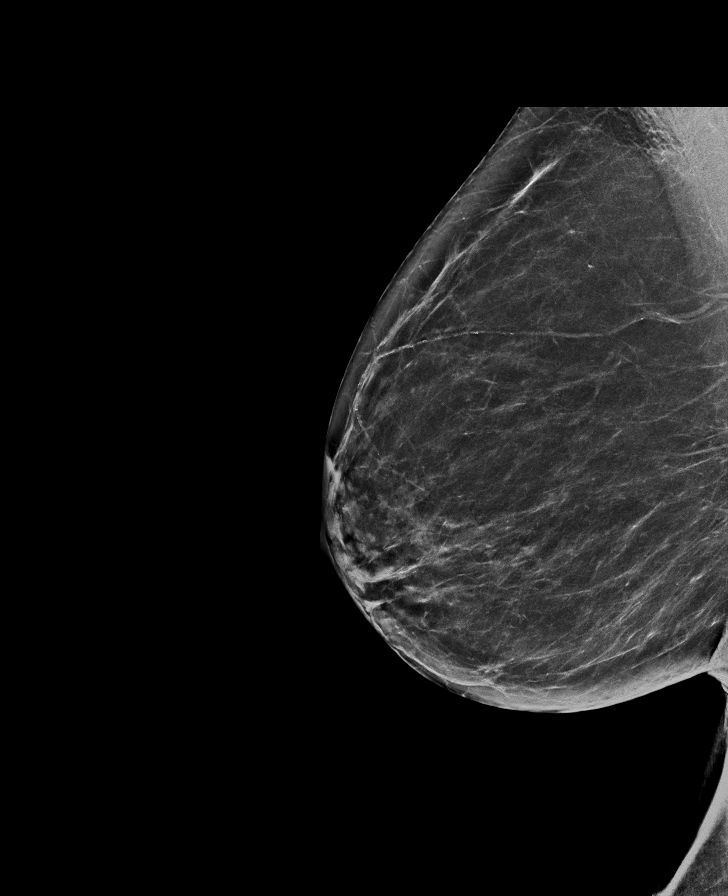

[R CC synth-2D]
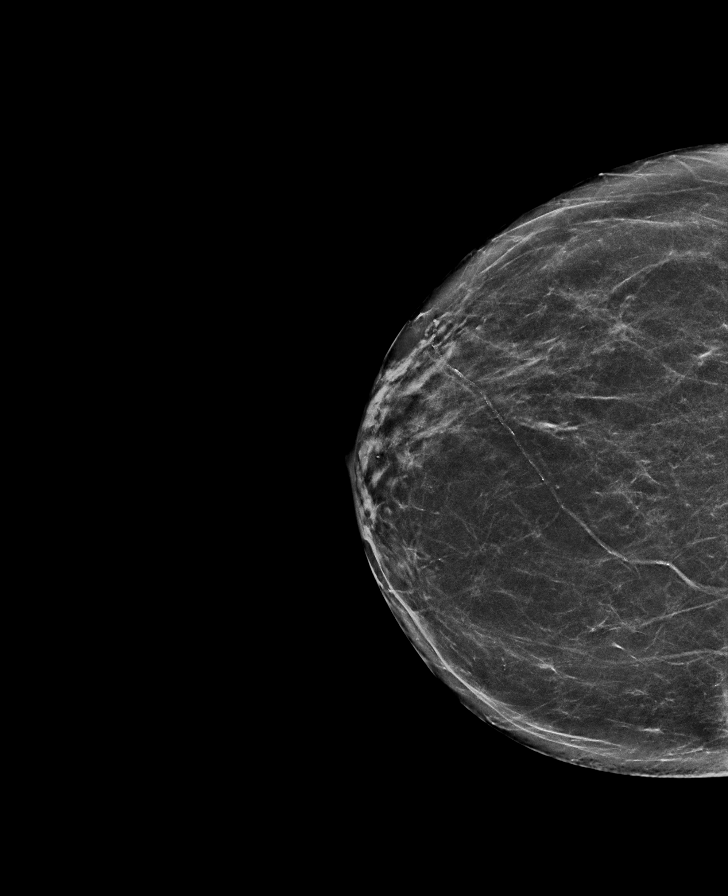

[L CC synth-2D]
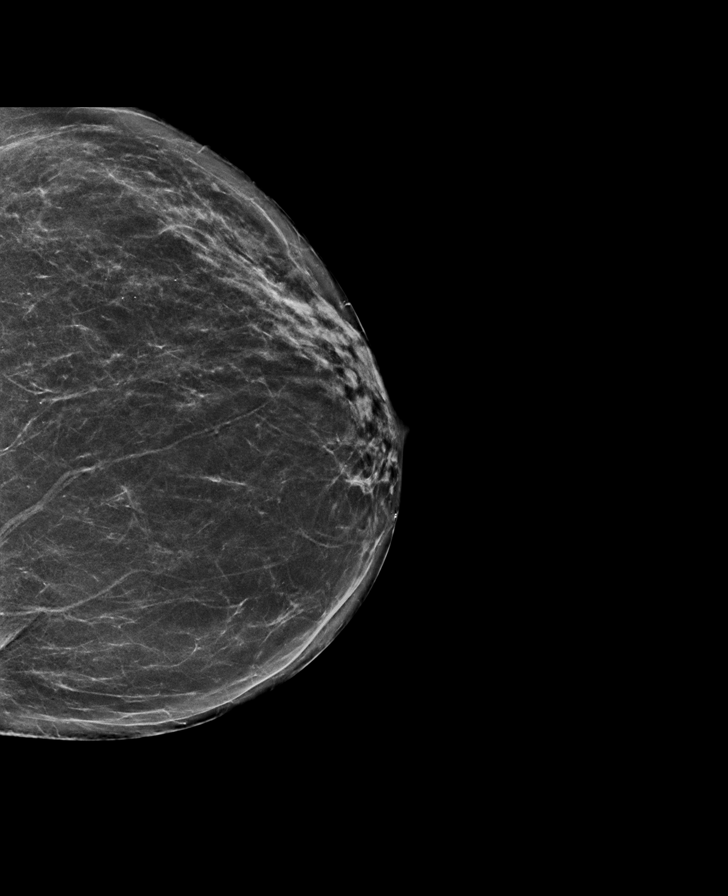

[L CC tomo · tomo slice 33/66.0]
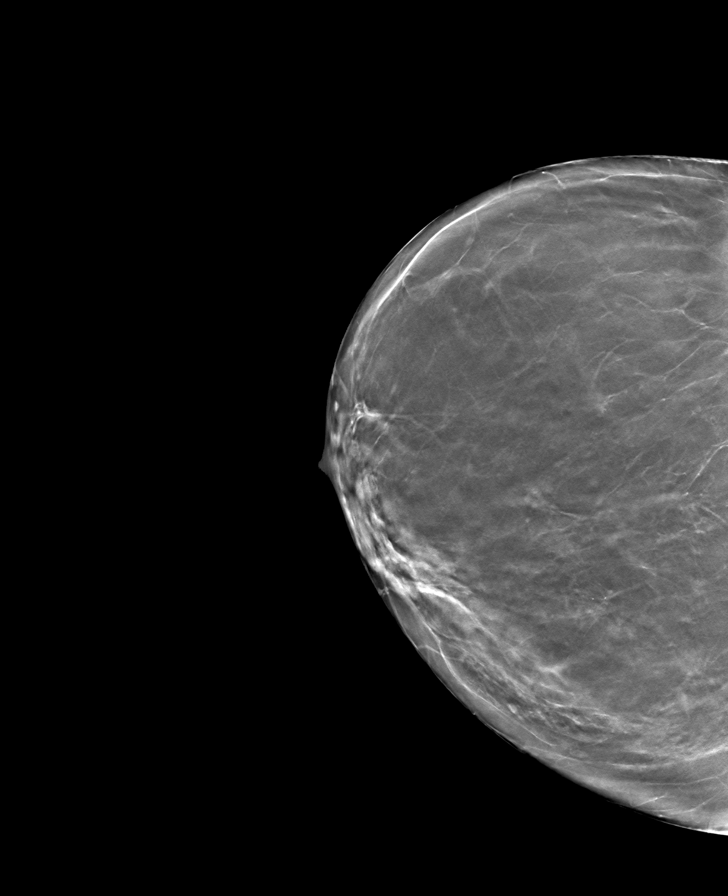

[R CC tomo · tomo slice 33/66.0]
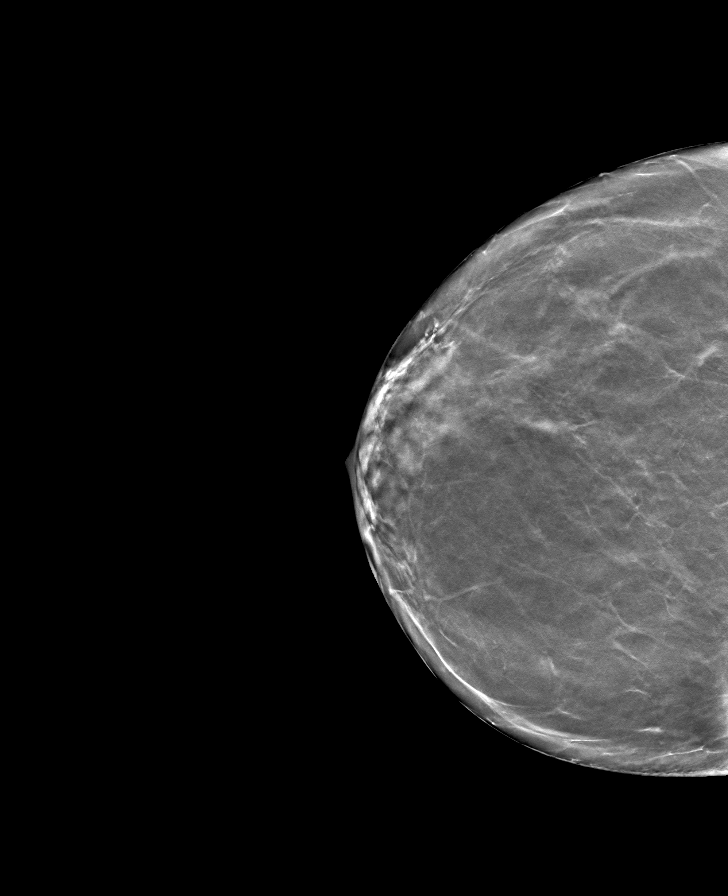

[R MLO tomo · tomo slice 36/71.0]
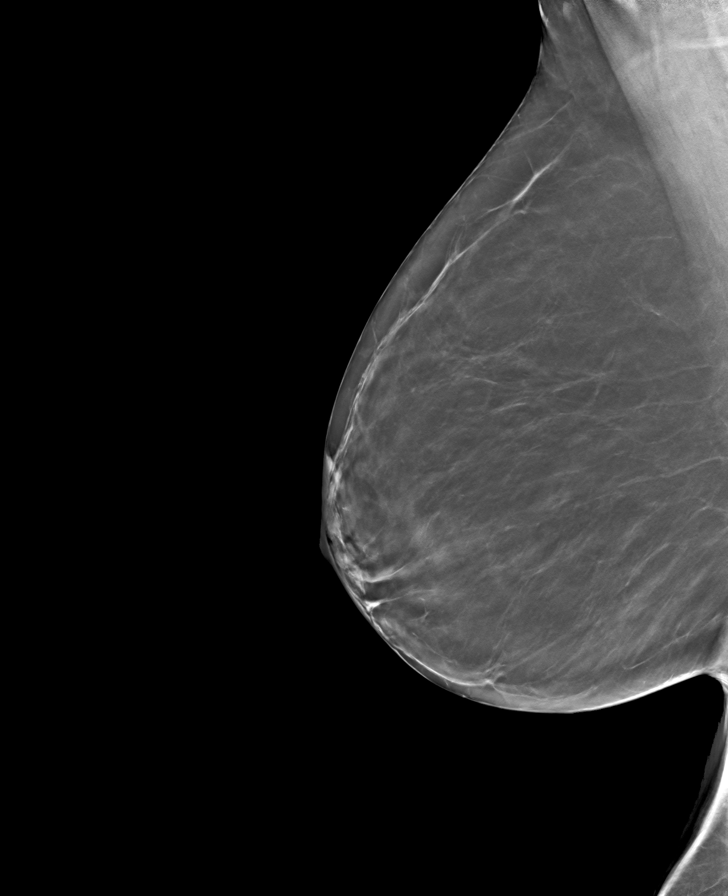

[L MLO tomo · tomo slice 36/71.0]
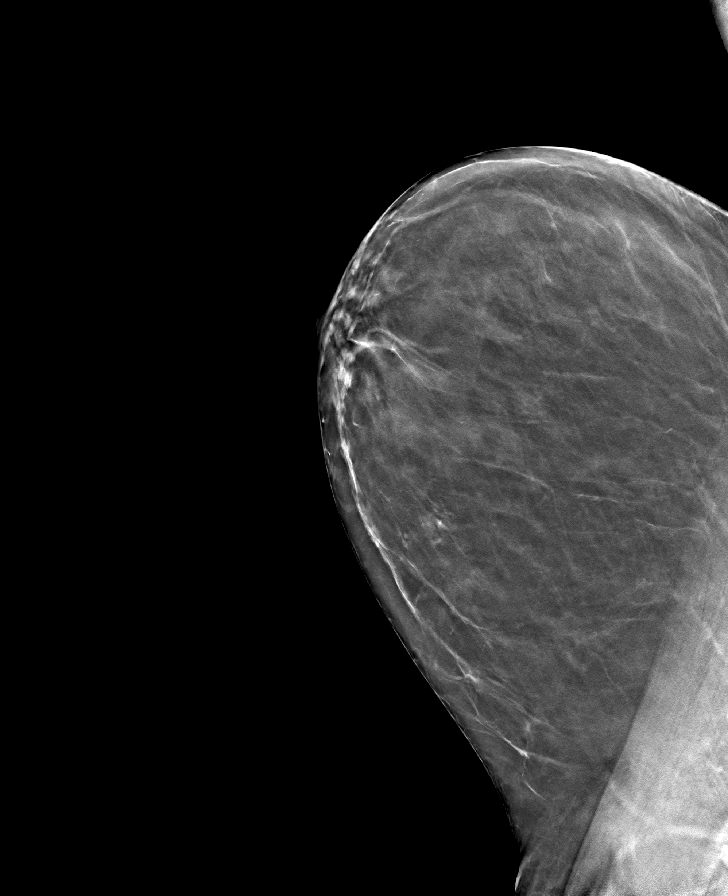

[8 of 24 positions shown; findings below may reference images not displayed]

ACR Breast Density Category b: There are scattered areas of
fibroglandular density.
FINDINGS: There are no findings suspicious for malignancy. Images were
processed with CAD.
IMPRESSION: No mammographic evidence of malignancy. A result letter of this
screening mammogram will be mailed directly to the patient.

RECOMMENDATION:
Screening mammogram in one year. (Code:CN-U-775)

BI-RADS CATEGORY  1: Negative.

## 2020-12-30 ENCOUNTER — Inpatient Hospital Stay: Payer: PPO | Attending: Oncology

## 2020-12-30 DIAGNOSIS — J449 Chronic obstructive pulmonary disease, unspecified: Secondary | ICD-10-CM | POA: Diagnosis not present

## 2020-12-30 DIAGNOSIS — Z87891 Personal history of nicotine dependence: Secondary | ICD-10-CM | POA: Diagnosis not present

## 2020-12-30 DIAGNOSIS — R5383 Other fatigue: Secondary | ICD-10-CM | POA: Diagnosis not present

## 2020-12-30 DIAGNOSIS — R531 Weakness: Secondary | ICD-10-CM | POA: Diagnosis not present

## 2020-12-30 DIAGNOSIS — I2510 Atherosclerotic heart disease of native coronary artery without angina pectoris: Secondary | ICD-10-CM | POA: Diagnosis not present

## 2020-12-30 DIAGNOSIS — Z7982 Long term (current) use of aspirin: Secondary | ICD-10-CM | POA: Diagnosis not present

## 2020-12-30 DIAGNOSIS — D509 Iron deficiency anemia, unspecified: Secondary | ICD-10-CM | POA: Diagnosis not present

## 2020-12-30 DIAGNOSIS — Z79899 Other long term (current) drug therapy: Secondary | ICD-10-CM | POA: Insufficient documentation

## 2020-12-30 DIAGNOSIS — I739 Peripheral vascular disease, unspecified: Secondary | ICD-10-CM | POA: Diagnosis not present

## 2020-12-30 DIAGNOSIS — I10 Essential (primary) hypertension: Secondary | ICD-10-CM | POA: Insufficient documentation

## 2020-12-30 LAB — CBC WITH DIFFERENTIAL/PLATELET
Abs Immature Granulocytes: 0.02 10*3/uL (ref 0.00–0.07)
Basophils Absolute: 0.1 10*3/uL (ref 0.0–0.1)
Basophils Relative: 1 %
Eosinophils Absolute: 0.2 10*3/uL (ref 0.0–0.5)
Eosinophils Relative: 3 %
HCT: 36.3 % (ref 36.0–46.0)
Hemoglobin: 11.4 g/dL — ABNORMAL LOW (ref 12.0–15.0)
Immature Granulocytes: 0 %
Lymphocytes Relative: 23 %
Lymphs Abs: 1.8 10*3/uL (ref 0.7–4.0)
MCH: 30.7 pg (ref 26.0–34.0)
MCHC: 31.4 g/dL (ref 30.0–36.0)
MCV: 97.8 fL (ref 80.0–100.0)
Monocytes Absolute: 0.5 10*3/uL (ref 0.1–1.0)
Monocytes Relative: 6 %
Neutro Abs: 5.2 10*3/uL (ref 1.7–7.7)
Neutrophils Relative %: 67 %
Platelets: 225 10*3/uL (ref 150–400)
RBC: 3.71 MIL/uL — ABNORMAL LOW (ref 3.87–5.11)
RDW: 12.3 % (ref 11.5–15.5)
WBC: 7.8 10*3/uL (ref 4.0–10.5)
nRBC: 0 % (ref 0.0–0.2)

## 2020-12-30 LAB — IRON AND TIBC
Iron: 42 ug/dL (ref 28–170)
Saturation Ratios: 14 % (ref 10.4–31.8)
TIBC: 312 ug/dL (ref 250–450)
UIBC: 270 ug/dL

## 2020-12-30 LAB — FERRITIN: Ferritin: 43 ng/mL (ref 11–307)

## 2020-12-31 ENCOUNTER — Inpatient Hospital Stay: Payer: PPO

## 2020-12-31 ENCOUNTER — Encounter: Payer: Self-pay | Admitting: Oncology

## 2020-12-31 ENCOUNTER — Inpatient Hospital Stay (HOSPITAL_BASED_OUTPATIENT_CLINIC_OR_DEPARTMENT_OTHER): Payer: PPO | Admitting: Oncology

## 2020-12-31 VITALS — BP 128/65 | HR 86 | Temp 97.8°F | Resp 18 | Wt 136.6 lb

## 2020-12-31 DIAGNOSIS — D509 Iron deficiency anemia, unspecified: Secondary | ICD-10-CM

## 2020-12-31 DIAGNOSIS — Z0000 Encounter for general adult medical examination without abnormal findings: Secondary | ICD-10-CM | POA: Diagnosis not present

## 2020-12-31 DIAGNOSIS — I739 Peripheral vascular disease, unspecified: Secondary | ICD-10-CM | POA: Diagnosis not present

## 2020-12-31 DIAGNOSIS — I779 Disorder of arteries and arterioles, unspecified: Secondary | ICD-10-CM | POA: Diagnosis not present

## 2020-12-31 DIAGNOSIS — R945 Abnormal results of liver function studies: Secondary | ICD-10-CM | POA: Diagnosis not present

## 2020-12-31 DIAGNOSIS — J449 Chronic obstructive pulmonary disease, unspecified: Secondary | ICD-10-CM | POA: Diagnosis not present

## 2020-12-31 DIAGNOSIS — G43709 Chronic migraine without aura, not intractable, without status migrainosus: Secondary | ICD-10-CM | POA: Diagnosis not present

## 2020-12-31 DIAGNOSIS — I700 Atherosclerosis of aorta: Secondary | ICD-10-CM | POA: Diagnosis not present

## 2020-12-31 DIAGNOSIS — Z23 Encounter for immunization: Secondary | ICD-10-CM | POA: Diagnosis not present

## 2020-12-31 NOTE — Progress Notes (Signed)
Patient here for oncology follow-up appointment, expresses no complaints or concerns at this time.    

## 2021-02-25 DIAGNOSIS — R8290 Unspecified abnormal findings in urine: Secondary | ICD-10-CM | POA: Diagnosis not present

## 2021-02-25 DIAGNOSIS — R399 Unspecified symptoms and signs involving the genitourinary system: Secondary | ICD-10-CM | POA: Diagnosis not present

## 2021-03-12 ENCOUNTER — Other Ambulatory Visit: Payer: Self-pay | Admitting: *Deleted

## 2021-03-12 DIAGNOSIS — Z87891 Personal history of nicotine dependence: Secondary | ICD-10-CM

## 2021-03-12 DIAGNOSIS — Z122 Encounter for screening for malignant neoplasm of respiratory organs: Secondary | ICD-10-CM

## 2021-03-12 NOTE — Progress Notes (Signed)
Contacted and scheduled for annual lung screening scan. Patient is a former smoker, quit 2019, 50 pack year.

## 2021-04-02 ENCOUNTER — Inpatient Hospital Stay: Payer: PPO | Attending: Oncology | Admitting: Oncology

## 2021-04-02 ENCOUNTER — Other Ambulatory Visit: Payer: Self-pay

## 2021-04-02 DIAGNOSIS — D509 Iron deficiency anemia, unspecified: Secondary | ICD-10-CM | POA: Insufficient documentation

## 2021-04-02 LAB — CBC WITH DIFFERENTIAL/PLATELET
Abs Immature Granulocytes: 0.02 10*3/uL (ref 0.00–0.07)
Basophils Absolute: 0.1 10*3/uL (ref 0.0–0.1)
Basophils Relative: 1 %
Eosinophils Absolute: 0.3 10*3/uL (ref 0.0–0.5)
Eosinophils Relative: 3 %
HCT: 39.1 % (ref 36.0–46.0)
Hemoglobin: 12.3 g/dL (ref 12.0–15.0)
Immature Granulocytes: 0 %
Lymphocytes Relative: 23 %
Lymphs Abs: 2 10*3/uL (ref 0.7–4.0)
MCH: 29.4 pg (ref 26.0–34.0)
MCHC: 31.5 g/dL (ref 30.0–36.0)
MCV: 93.3 fL (ref 80.0–100.0)
Monocytes Absolute: 0.6 10*3/uL (ref 0.1–1.0)
Monocytes Relative: 7 %
Neutro Abs: 5.7 10*3/uL (ref 1.7–7.7)
Neutrophils Relative %: 66 %
Platelets: 230 10*3/uL (ref 150–400)
RBC: 4.19 MIL/uL (ref 3.87–5.11)
RDW: 12.7 % (ref 11.5–15.5)
WBC: 8.6 10*3/uL (ref 4.0–10.5)
nRBC: 0 % (ref 0.0–0.2)

## 2021-04-02 LAB — IRON AND TIBC
Iron: 25 ug/dL — ABNORMAL LOW (ref 28–170)
Saturation Ratios: 7 % — ABNORMAL LOW (ref 10.4–31.8)
TIBC: 386 ug/dL (ref 250–450)
UIBC: 361 ug/dL

## 2021-04-02 LAB — FERRITIN: Ferritin: 17 ng/mL (ref 11–307)

## 2021-04-05 ENCOUNTER — Inpatient Hospital Stay (HOSPITAL_BASED_OUTPATIENT_CLINIC_OR_DEPARTMENT_OTHER): Payer: PPO | Admitting: Oncology

## 2021-04-05 ENCOUNTER — Encounter: Payer: Self-pay | Admitting: Oncology

## 2021-04-05 ENCOUNTER — Inpatient Hospital Stay: Payer: PPO

## 2021-04-05 VITALS — BP 128/63 | HR 73 | Resp 20 | Wt 134.1 lb

## 2021-04-05 DIAGNOSIS — D509 Iron deficiency anemia, unspecified: Secondary | ICD-10-CM | POA: Diagnosis not present

## 2021-04-05 NOTE — Progress Notes (Signed)
[place]  Telephone:(336) [phone] Fax:(336) [phone]  ID: Jasmine Acosta OB: September 24, 1951  MR#: [id number]  PFX#:[id number]  Patient Care Team: Kirk Ruths, MD as PCP - General (Internal Medicine) Lloyd Huger, MD as Consulting Physician (Oncology)  CHIEF COMPLAINT: Iron deficiency anemia.  INTERVAL HISTORY: Patient returns to clinic today for repeat laboratory work, further evaluation, and consideration of additional IV Feraheme.  She last received IV Feraheme on 10/02/2020 and 10/09/2020.  In the interim, she appears to have done well.  She currently feels well and is asymptomatic. Admits to feeling a little bit better post her last iron infusion.  She reports no worsening shortness of breath or fatigue.  She does not feel she needs iron today.   REVIEW OF SYSTEMS:   Review of Systems  Constitutional: Negative.  Negative for fever, malaise/fatigue and weight loss.  Respiratory: Negative.  Negative for cough, hemoptysis and shortness of breath.   Cardiovascular: Negative.  Negative for chest pain and leg swelling.  Gastrointestinal: Negative.  Negative for abdominal pain, blood in stool and melena.  Genitourinary: Negative.  Negative for hematuria.  Musculoskeletal: Negative.  Negative for back pain.  Skin: Negative.  Negative for rash.  Neurological: Negative.  Negative for dizziness, focal weakness, weakness and headaches.  Psychiatric/Behavioral: Negative.  The patient is not nervous/anxious.    As per HPI. Otherwise, a complete review of systems is negative.  PAST MEDICAL HISTORY: Past Medical History:  Diagnosis Date   Carotid artery occlusion    COPD (chronic obstructive pulmonary disease) ([place]) 07/06/2015   Coronary artery disease    Headache    Hypertension    Hyperthyroidism    ablated with iodine   Peripheral vascular disease (HCC)    Bilateral Carotid Artery Disease   Personal history of arterial venous malformation (AVM)     Pseudoclaudication     PAST SURGICAL HISTORY: Past Surgical History:  Procedure Laterality Date   ABDOMINAL HYSTERECTOMY     heavy bleeding   CATARACT EXTRACTION W/PHACO Right 10/09/2018   Procedure: CATARACT EXTRACTION PHACO AND INTRAOCULAR LENS PLACEMENT ([place]) RIGHT;  Surgeon: Leandrew Koyanagi, MD;  Location: [place];  Service: Ophthalmology;  Laterality: Right;   CATARACT EXTRACTION W/PHACO Left 01/01/2019   Procedure: CATARACT EXTRACTION PHACO AND INTRAOCULAR LENS PLACEMENT ([place])  LEFT;  Surgeon: Leandrew Koyanagi, MD;  Location: [place];  Service: Ophthalmology;  Laterality: Left;   COLONOSCOPY WITH PROPOFOL N/A 10/30/2019   Procedure: COLONOSCOPY WITH PROPOFOL;  Surgeon: Toledo, Benay Pike, MD;  Location: [facility] ENDOSCOPY;  Service: Gastroenterology;  Laterality: N/A;   CORONARY ANGIOPLASTY     ENDARTERECTOMY Right 06/14/2018   Procedure: ENDARTERECTOMY CAROTID;  Surgeon: Algernon Huxley, MD;  Location: [facility];  Service: Vascular;  Laterality: Right;   ESOPHAGOGASTRODUODENOSCOPY N/A 07/05/2019   Procedure: ESOPHAGOGASTRODUODENOSCOPY (EGD);  Surgeon: Lollie Sails, MD;  Location: [facility] ENDOSCOPY;  Service: Endoscopy;  Laterality: N/A;   ESOPHAGOGASTRODUODENOSCOPY (EGD) WITH PROPOFOL N/A 07/08/2019   Procedure: ESOPHAGOGASTRODUODENOSCOPY (EGD) WITH PROPOFOL;  Surgeon: Lollie Sails, MD;  Location: [facility] ENDOSCOPY;  Service: Endoscopy;  Laterality: N/A;   EYE SURGERY     hallux vagus correction Right    HALLUX VALGUS AUSTIN Left 07/01/2016   Procedure: HALLUX VALGUS AUSTIN left foot;  Surgeon: Sharlotte Alamo, DPM;  Location: [facility];  Service: Podiatry;  Laterality: Left;   LOWER EXTREMITY ANGIOGRAPHY Right 11/05/2018   Procedure: LOWER EXTREMITY ANGIOGRAPHY;  Surgeon: Algernon Huxley, MD;  Location: [place] [facility];  Service:  Cardiovascular;  Laterality: Right;    FAMILY HISTORY: Family History  Problem Relation Age of Onset   Diabetes Mother    Emphysema  Father    Heart disease Sister    Heart disease Brother    Breast cancer Neg Hx    Ovarian cancer Neg Hx    Colon cancer Neg Hx     ADVANCED DIRECTIVES (Y/N):  N  HEALTH MAINTENANCE: Social History   Tobacco Use   Smoking status: Former    Packs/day: 1.00    Years: 50.00    Pack years: 50.00    Types: Cigarettes    Quit date: 06/14/2018    Years since quitting: 2.8   Smokeless tobacco: Never   Tobacco comments:    since age 32  Vaping Use   Vaping Use: Never used  Substance Use Topics   Alcohol use: No   Drug use: No     Colonoscopy:  PAP:  Bone density:  Lipid panel:  Allergies  Allergen Reactions   Lisinopril Other (See Comments)    Other reaction(s): Angioedema Of tongue only   Codeine Nausea And Vomiting    Current Outpatient Medications  Medication Sig Dispense Refill   amLODipine (NORVASC) 5 MG tablet Take 5 mg by mouth daily.   11   aspirin EC 81 MG tablet Take 1 tablet (81 mg total) by mouth daily. 150 tablet 2   clopidogrel (PLAVIX) 75 MG tablet Take 1 tablet (75 mg total) by mouth daily with breakfast. 30 tablet 11   gabapentin (NEURONTIN) 100 MG capsule Take 100 mg by mouth in the morning and at bedtime.     meloxicam (MOBIC) 15 MG tablet Take by mouth.     Multiple Vitamin (MULTIVITAMIN) capsule Take 1 capsule by mouth daily.     potassium chloride (K-DUR,KLOR-CON) 10 MEQ tablet Take 10 mEq by mouth daily.     rosuvastatin (CRESTOR) 40 MG tablet Take 40 mg by mouth daily.     topiramate (TOPAMAX) 50 MG tablet Take 1 tablet by mouth 2 (two) times daily.     No current facility-administered medications for this visit.    OBJECTIVE: There were no vitals filed for this visit.   There is no height or weight on file to calculate BMI.    ECOG FS:0 - Asymptomatic  Physical Exam Constitutional:      Appearance: Normal appearance.  HENT:     Head: Normocephalic and atraumatic.  Eyes:     Pupils: Pupils are equal, round, and reactive to light.   Cardiovascular:     Rate and Rhythm: Normal rate and regular rhythm.     Heart sounds: Normal heart sounds. No murmur heard. Pulmonary:     Effort: Pulmonary effort is normal.     Breath sounds: Normal breath sounds. No wheezing.  Abdominal:     General: Bowel sounds are normal. There is no distension.     Palpations: Abdomen is soft.     Tenderness: There is no abdominal tenderness.  Musculoskeletal:        General: Normal range of motion.     Cervical back: Normal range of motion.  Skin:    General: Skin is warm and dry.     Findings: No rash.  Neurological:     Mental Status: She is alert and oriented to person, place, and time.  Psychiatric:        Judgment: Judgment normal.    LAB RESULTS:  Lab Results  Component Value Date  NA 137 06/11/2019   K 3.2 (L) 06/11/2019   CL 105 06/11/2019   CO2 23 06/11/2019   GLUCOSE 110 (H) 06/11/2019   BUN 9 06/11/2019   CREATININE 0.65 06/11/2019   CALCIUM 9.2 06/11/2019   PROT 7.7 06/11/2019   ALBUMIN 4.2 06/11/2019   AST 18 06/11/2019   ALT 17 06/11/2019   ALKPHOS 67 06/11/2019   BILITOT 0.6 06/11/2019   GFRNONAA >60 06/11/2019   GFRAA >60 06/11/2019    Lab Results  Component Value Date   WBC 8.6 04/02/2021   NEUTROABS 5.7 04/02/2021   HGB 12.3 04/02/2021   HCT 39.1 04/02/2021   MCV 93.3 04/02/2021   PLT 230 04/02/2021   Lab Results  Component Value Date   IRON 25 (L) 04/02/2021   TIBC 386 04/02/2021   IRONPCTSAT 7 (L) 04/02/2021   Lab Results  Component Value Date   FERRITIN 17 04/02/2021     STUDIES: No results found.  ASSESSMENT: Iron deficiency anemia.  PLAN:    1.  Iron deficiency anemia:  -Patient last received IV iron on 10/02/2020 and 10/09/2020 -Labs from 04/02/2021 showed a ferritin of 17 iron saturations of 7% and a hemoglobin of 12.3. -She is asymptomatic -Patient feels well and would like to hold off on IV iron at this time although her iron labs have declined.  Her hemoglobin has  normalized at 12.3. -Had upper endoscopy on 07/08/2019 which showed 1 angiectasia in the duodenum that was treated. -Colonoscopy from 10/28/2019 was negative. --Unable to tolerate oral iron secondary to constipation  Disposition: -Labs in 8 weeks (CBC, Ferritin, Iron) -RTC in 4 months with labs, assessment and possible IV iron.   Greater than 50% was spent in counseling and coordination of care with this patient including but not limited to discussion of the relevant topics above (See A&P) including, but not limited to diagnosis and management of acute and chronic medical conditions.   Patient expressed understanding and was in agreement with this plan. She also understands that She can call clinic at any time with any questions, concerns, or complaints.    Jacquelin Hawking, NP   04/05/2021 1:41 PM

## 2021-04-06 ENCOUNTER — Encounter: Payer: Self-pay | Admitting: Oncology

## 2021-05-12 ENCOUNTER — Other Ambulatory Visit: Payer: Self-pay

## 2021-05-12 ENCOUNTER — Ambulatory Visit
Admission: RE | Admit: 2021-05-12 | Discharge: 2021-05-12 | Disposition: A | Discharge status: Home / Self Care | Payer: PPO | Source: Ambulatory Visit | Attending: Oncology | Admitting: Oncology

## 2021-05-12 DIAGNOSIS — Z87891 Personal history of nicotine dependence: Secondary | ICD-10-CM | POA: Insufficient documentation

## 2021-05-12 DIAGNOSIS — Z122 Encounter for screening for malignant neoplasm of respiratory organs: Secondary | ICD-10-CM | POA: Insufficient documentation

## 2021-05-14 ENCOUNTER — Encounter: Payer: Self-pay | Admitting: *Deleted

## 2021-05-15 NOTE — Progress Notes (Signed)
Jasmine Acosta, can we get her set up for 3-4 doses of IV Venofer in the next couple weeks.  Faythe Casa, NP 05/15/2021 9:52 AM

## 2021-05-24 DIAGNOSIS — H43813 Vitreous degeneration, bilateral: Secondary | ICD-10-CM | POA: Diagnosis not present

## 2021-05-27 ENCOUNTER — Other Ambulatory Visit: Payer: Self-pay | Admitting: Oncology

## 2021-05-27 DIAGNOSIS — D509 Iron deficiency anemia, unspecified: Secondary | ICD-10-CM

## 2021-05-31 ENCOUNTER — Inpatient Hospital Stay: Payer: PPO | Attending: Oncology

## 2021-05-31 DIAGNOSIS — D509 Iron deficiency anemia, unspecified: Secondary | ICD-10-CM | POA: Diagnosis not present

## 2021-05-31 LAB — CBC WITH DIFFERENTIAL/PLATELET
Abs Immature Granulocytes: 0.01 10*3/uL (ref 0.00–0.07)
Basophils Absolute: 0 10*3/uL (ref 0.0–0.1)
Basophils Relative: 1 %
Eosinophils Absolute: 0.2 10*3/uL (ref 0.0–0.5)
Eosinophils Relative: 3 %
HCT: 30.5 % — ABNORMAL LOW (ref 36.0–46.0)
Hemoglobin: 9.1 g/dL — ABNORMAL LOW (ref 12.0–15.0)
Immature Granulocytes: 0 %
Lymphocytes Relative: 23 %
Lymphs Abs: 1.4 10*3/uL (ref 0.7–4.0)
MCH: 26.9 pg (ref 26.0–34.0)
MCHC: 29.8 g/dL — ABNORMAL LOW (ref 30.0–36.0)
MCV: 90.2 fL (ref 80.0–100.0)
Monocytes Absolute: 0.5 10*3/uL (ref 0.1–1.0)
Monocytes Relative: 9 %
Neutro Abs: 3.9 10*3/uL (ref 1.7–7.7)
Neutrophils Relative %: 64 %
Platelets: 256 10*3/uL (ref 150–400)
RBC: 3.38 MIL/uL — ABNORMAL LOW (ref 3.87–5.11)
RDW: 13.5 % (ref 11.5–15.5)
WBC: 6 10*3/uL (ref 4.0–10.5)
nRBC: 0 % (ref 0.0–0.2)

## 2021-05-31 LAB — IRON AND TIBC
Iron: 22 ug/dL — ABNORMAL LOW (ref 28–170)
Saturation Ratios: 5 % — ABNORMAL LOW (ref 10.4–31.8)
TIBC: 442 ug/dL (ref 250–450)
UIBC: 420 ug/dL

## 2021-05-31 LAB — FERRITIN: Ferritin: 4 ng/mL — ABNORMAL LOW (ref 11–307)

## 2021-06-29 DIAGNOSIS — I700 Atherosclerosis of aorta: Secondary | ICD-10-CM | POA: Diagnosis not present

## 2021-06-29 DIAGNOSIS — R7989 Other specified abnormal findings of blood chemistry: Secondary | ICD-10-CM | POA: Diagnosis not present

## 2021-06-29 DIAGNOSIS — I779 Disorder of arteries and arterioles, unspecified: Secondary | ICD-10-CM | POA: Diagnosis not present

## 2021-07-05 DIAGNOSIS — I779 Disorder of arteries and arterioles, unspecified: Secondary | ICD-10-CM | POA: Diagnosis not present

## 2021-07-05 DIAGNOSIS — I10 Essential (primary) hypertension: Secondary | ICD-10-CM | POA: Diagnosis not present

## 2021-07-05 DIAGNOSIS — R7989 Other specified abnormal findings of blood chemistry: Secondary | ICD-10-CM | POA: Diagnosis not present

## 2021-07-05 DIAGNOSIS — R300 Dysuria: Secondary | ICD-10-CM | POA: Diagnosis not present

## 2021-07-05 DIAGNOSIS — I739 Peripheral vascular disease, unspecified: Secondary | ICD-10-CM | POA: Diagnosis not present

## 2021-07-05 DIAGNOSIS — I700 Atherosclerosis of aorta: Secondary | ICD-10-CM | POA: Diagnosis not present

## 2021-07-05 DIAGNOSIS — J449 Chronic obstructive pulmonary disease, unspecified: Secondary | ICD-10-CM | POA: Diagnosis not present

## 2021-07-06 ENCOUNTER — Encounter (INDEPENDENT_AMBULATORY_CARE_PROVIDER_SITE_OTHER): Payer: PPO

## 2021-07-06 ENCOUNTER — Ambulatory Visit (INDEPENDENT_AMBULATORY_CARE_PROVIDER_SITE_OTHER): Payer: PPO | Admitting: Vascular Surgery

## 2021-07-20 ENCOUNTER — Other Ambulatory Visit: Payer: Self-pay | Admitting: Internal Medicine

## 2021-07-20 DIAGNOSIS — Z1231 Encounter for screening mammogram for malignant neoplasm of breast: Secondary | ICD-10-CM

## 2021-08-02 ENCOUNTER — Ambulatory Visit
Admission: RE | Admit: 2021-08-02 | Discharge: 2021-08-02 | Disposition: A | Discharge status: Home / Self Care | Payer: PPO | Source: Ambulatory Visit | Attending: Internal Medicine | Admitting: Internal Medicine

## 2021-08-02 ENCOUNTER — Other Ambulatory Visit: Payer: Self-pay

## 2021-08-02 DIAGNOSIS — Z1231 Encounter for screening mammogram for malignant neoplasm of breast: Secondary | ICD-10-CM | POA: Diagnosis not present

## 2021-08-05 ENCOUNTER — Other Ambulatory Visit: Payer: Self-pay

## 2021-08-05 ENCOUNTER — Inpatient Hospital Stay: Payer: PPO | Attending: Oncology

## 2021-08-05 ENCOUNTER — Inpatient Hospital Stay: Payer: PPO

## 2021-08-05 ENCOUNTER — Encounter: Payer: Self-pay | Admitting: Oncology

## 2021-08-05 ENCOUNTER — Inpatient Hospital Stay (HOSPITAL_BASED_OUTPATIENT_CLINIC_OR_DEPARTMENT_OTHER): Payer: PPO | Admitting: Oncology

## 2021-08-05 VITALS — BP 131/75 | HR 81 | Temp 97.0°F | Resp 18 | Wt 131.2 lb

## 2021-08-05 DIAGNOSIS — D509 Iron deficiency anemia, unspecified: Secondary | ICD-10-CM | POA: Insufficient documentation

## 2021-08-05 LAB — CBC WITH DIFFERENTIAL/PLATELET
Abs Immature Granulocytes: 0.02 10*3/uL (ref 0.00–0.07)
Basophils Absolute: 0 10*3/uL (ref 0.0–0.1)
Basophils Relative: 1 %
Eosinophils Absolute: 0.3 10*3/uL (ref 0.0–0.5)
Eosinophils Relative: 4 %
HCT: 27.4 % — ABNORMAL LOW (ref 36.0–46.0)
Hemoglobin: 7.8 g/dL — ABNORMAL LOW (ref 12.0–15.0)
Immature Granulocytes: 0 %
Lymphocytes Relative: 28 %
Lymphs Abs: 2 10*3/uL (ref 0.7–4.0)
MCH: 22.7 pg — ABNORMAL LOW (ref 26.0–34.0)
MCHC: 28.5 g/dL — ABNORMAL LOW (ref 30.0–36.0)
MCV: 79.9 fL — ABNORMAL LOW (ref 80.0–100.0)
Monocytes Absolute: 0.5 10*3/uL (ref 0.1–1.0)
Monocytes Relative: 7 %
Neutro Abs: 4.3 10*3/uL (ref 1.7–7.7)
Neutrophils Relative %: 60 %
Platelets: 246 10*3/uL (ref 150–400)
RBC: 3.43 MIL/uL — ABNORMAL LOW (ref 3.87–5.11)
RDW: 17.3 % — ABNORMAL HIGH (ref 11.5–15.5)
WBC: 7.1 10*3/uL (ref 4.0–10.5)
nRBC: 0 % (ref 0.0–0.2)

## 2021-08-05 LAB — IRON AND TIBC
Iron: 21 ug/dL — ABNORMAL LOW (ref 28–170)
Saturation Ratios: 5 % — ABNORMAL LOW (ref 10.4–31.8)
TIBC: 391 ug/dL (ref 250–450)
UIBC: 370 ug/dL

## 2021-08-05 LAB — FERRITIN: Ferritin: 4 ng/mL — ABNORMAL LOW (ref 11–307)

## 2021-08-05 NOTE — Progress Notes (Signed)
Patient denies new problems/concerns today.   °

## 2021-08-05 NOTE — Progress Notes (Signed)
[place]  [phone] [phone]) [phone]  ID: Jasmine Acosta OB: December 04, 1950  MR#: [id number]  BDZ#:[id number]  Patient Care Team: Kirk Ruths, MD as PCP - General (Internal Medicine) Lloyd Huger, MD as Consulting Physician (Oncology)  CHIEF COMPLAINT: Iron deficiency anemia.  INTERVAL HISTORY: Jasmine Acosta is a 70 year old female with past medical history significant for hypertension, arthrosclerosis, PVD, COPD, tobacco abuse who is followed by Dr. Grayland Ormond for iron deficiency anemia.  She last received IV iron in December 2021.  Denies any interval hospitalizations.  It appears patient had labs drawn on 05/31/2021 which showed a hemoglobin of 9.1, ferritin of 4 and iron saturation of 5%.  MCV is 90.2.  Does not appear that she received iron.  She had a lung CT scan on 05/12/2021 which was read as BI-RADS Category 2-repeat in 1 year.  She currently feels well and is asymptomatic.  Reports a diverticulitis flare requiring antibiotics. Denies any bleeding. Reports complete resolution of her abdominal pain.   REVIEW OF SYSTEMS:   Review of Systems  All other systems reviewed and are negative.  As per HPI. Otherwise, a complete review of systems is negative.  PAST MEDICAL HISTORY: Past Medical History:  Diagnosis Date   Carotid artery occlusion    COPD (chronic obstructive pulmonary disease) ([place]) 07/06/2015   Coronary artery disease    Headache    Hypertension    Hyperthyroidism    ablated with iodine   Peripheral vascular disease (HCC)    Bilateral Carotid Artery Disease   Personal history of arterial venous malformation (AVM)    Pseudoclaudication     PAST SURGICAL HISTORY: Past Surgical History:  Procedure Laterality Date   ABDOMINAL HYSTERECTOMY     heavy bleeding   CATARACT EXTRACTION W/PHACO Right 10/09/2018   Procedure: CATARACT EXTRACTION PHACO AND INTRAOCULAR LENS PLACEMENT ([place]) RIGHT;  Surgeon: Leandrew Koyanagi,  MD;  Location: [place];  Service: Ophthalmology;  Laterality: Right;   CATARACT EXTRACTION W/PHACO Left 01/01/2019   Procedure: CATARACT EXTRACTION PHACO AND INTRAOCULAR LENS PLACEMENT ([place])  LEFT;  Surgeon: Leandrew Koyanagi, MD;  Location: [place];  Service: Ophthalmology;  Laterality: Left;   COLONOSCOPY WITH PROPOFOL N/A 10/30/2019   Procedure: COLONOSCOPY WITH PROPOFOL;  Surgeon: Toledo, Benay Pike, MD;  Location: [facility] ENDOSCOPY;  Service: Gastroenterology;  Laterality: N/A;   CORONARY ANGIOPLASTY     ENDARTERECTOMY Right 06/14/2018   Procedure: ENDARTERECTOMY CAROTID;  Surgeon: Algernon Huxley, MD;  Location: [facility];  Service: Vascular;  Laterality: Right;   ESOPHAGOGASTRODUODENOSCOPY N/A 07/05/2019   Procedure: ESOPHAGOGASTRODUODENOSCOPY (EGD);  Surgeon: Lollie Sails, MD;  Location[facility] ENDOSCOPY;  Service: Endoscopy;  Laterality: N/A;   ESOPHAGOGASTRODUODENOSCOPY (EGD) WITH PROPOFOL N/A 07/08/2019   Procedure: ESOPHAGOGASTRODUODENOSCOPY (EGD) WITH PROPOFOL;  Surgeon: Lollie Sails, MD;  Location: [facility] ENDOSCOPY;  Service: Endoscopy;  Laterality: N/A;   EYE SURGERY     hallux vagus correction Right    HALLUX VALGUS AUSTIN Left 07/01/2016   Procedure: HALLUX VALGUS AUSTIN left foot;  Surgeon: Sharlotte Alamo, DPM;  Location: [facility];  Service: Podiatry;  Laterality: Left;   LOWER EXTREMITY ANGIOGRAPHY Right 11/05/2018   Procedure: LOWER EXTREMITY ANGIOGRAPHY;  Surgeon: Algernon Huxley, MD;  Location: [place] CV [facility];  Service: Cardiovascular;  Laterality: Right;    FAMILY HISTORY: Family History  Problem Relation Age of Onset   Diabetes Mother    Emphysema Father    Heart disease Sister    Heart disease Brother  Breast cancer Neg Hx    Ovarian cancer Neg Hx    Colon cancer Neg Hx     ADVANCED DIRECTIVES (Y/N):  N  HEALTH MAINTENANCE: Social History   Tobacco Use   Smoking status: Former    Packs/day: 1.00    Years: 50.00    Pack years: 50.00     Types: Cigarettes    Quit date: 06/14/2018    Years since quitting: 3.1   Smokeless tobacco: Never   Tobacco comments:    since age 52  Vaping Use   Vaping Use: Never used  Substance Use Topics   Alcohol use: No   Drug use: No     Colonoscopy:  PAP:  Bone density:  Lipid panel:  Allergies  Allergen Reactions   Lisinopril Other (See Comments)    Other reaction(s): Angioedema Of tongue only   Codeine Nausea And Vomiting    Current Outpatient Medications  Medication Sig Dispense Refill   amLODipine (NORVASC) 5 MG tablet Take 5 mg by mouth daily.   11   aspirin EC 81 MG tablet Take 1 tablet (81 mg total) by mouth daily. 150 tablet 2   clopidogrel (PLAVIX) 75 MG tablet Take 1 tablet (75 mg total) by mouth daily with breakfast. 30 tablet 11   meloxicam (MOBIC) 15 MG tablet Take by mouth.     Multiple Vitamin (MULTIVITAMIN) capsule Take 1 capsule by mouth daily.     potassium chloride (K-DUR,KLOR-CON) 10 MEQ tablet Take 10 mEq by mouth daily.     rosuvastatin (CRESTOR) 40 MG tablet Take 40 mg by mouth daily.     topiramate (TOPAMAX) 50 MG tablet Take 1 tablet by mouth 2 (two) times daily.     No current facility-administered medications for this visit.    OBJECTIVE: Vitals:   08/05/21 1345  BP: 131/75  Pulse: 81  Resp: 18  Temp: (!) 97 F (36.1 C)     Body mass index is 24.79 kg/m.    ECOG FS:0 - Asymptomatic  Physical Exam Constitutional:      Appearance: Normal appearance.  HENT:     Head: Normocephalic and atraumatic.  Eyes:     Pupils: Pupils are equal, round, and reactive to light.  Cardiovascular:     Rate and Rhythm: Normal rate and regular rhythm.     Heart sounds: Normal heart sounds. No murmur heard. Pulmonary:     Effort: Pulmonary effort is normal.     Breath sounds: Normal breath sounds. No wheezing.  Abdominal:     General: Bowel sounds are normal. There is no distension.     Palpations: Abdomen is soft.     Tenderness: There is no  abdominal tenderness.  Musculoskeletal:        General: Normal range of motion.     Cervical back: Normal range of motion.  Skin:    General: Skin is warm and dry.     Findings: No rash.  Neurological:     Mental Status: She is alert and oriented to person, place, and time.  Psychiatric:        Judgment: Judgment normal.    LAB RESULTS:  Lab Results  Component Value Date   NA 137 06/11/2019   K 3.2 (L) 06/11/2019   CL 105 06/11/2019   CO2 23 06/11/2019   GLUCOSE 110 (H) 06/11/2019   BUN 9 06/11/2019   CREATININE 0.65 06/11/2019   CALCIUM 9.2 06/11/2019   PROT 7.7 06/11/2019   ALBUMIN 4.2 06/11/2019  AST 18 06/11/2019   ALT 17 06/11/2019   ALKPHOS 67 06/11/2019   BILITOT 0.6 06/11/2019   GFRNONAA >60 06/11/2019   GFRAA >60 06/11/2019    Lab Results  Component Value Date   WBC 7.1 08/05/2021   NEUTROABS 4.3 08/05/2021   HGB 7.8 (L) 08/05/2021   HCT 27.4 (L) 08/05/2021   MCV 79.9 (L) 08/05/2021   PLT 246 08/05/2021   Lab Results  Component Value Date   IRON 22 (L) 05/31/2021   TIBC 442 05/31/2021   IRONPCTSAT 5 (L) 05/31/2021   Lab Results  Component Value Date   FERRITIN 4 (L) 05/31/2021     STUDIES: No results found.  ASSESSMENT: Iron deficiency anemia.  PLAN:    1.  Iron deficiency anemia:  She last received IV iron on 10/02/2020 and 10/09/2020.  She had a upper endoscopy on 07/08/2019 which showed 1 angiectasia in the duodenum that was treated and a colonoscopy on 10/28/2019 which was negative.  She is unable to tolerate oral iron secondary to constipation.  Labs from 05/31/2021 show ferritin of 4 and iron saturation of 5%.  TIBC is 442.  Hemoglobin is low at 9.1.  Does not appear she received any IV iron.  Repeat labs from today show hemoglobin of 7.8 and MCV of 79.9.  Iron levels are pending.  Proceed with 2 doses of IV Feraheme.  Patient is unable to stay for her iron today but will return next week for her first dose.  She will then return to clinic in  6 weeks for labs, see MD/NP and additional IV iron.  Disposition: IV Feraheme x2 followed by 6 weeks labs, see MD and possible additional iron.  I spent 15 minutes dedicated to the care of this patient (face-to-face and non-face-to-face) on the date of the encounter to include what is described in the assessment and plan.  Patient expressed understanding and was in agreement with this plan. She also understands that She can call clinic at any time with any questions, concerns, or complaints.    Jacquelin Hawking, NP   08/05/2021 2:02 PM

## 2021-08-06 ENCOUNTER — Other Ambulatory Visit (INDEPENDENT_AMBULATORY_CARE_PROVIDER_SITE_OTHER): Payer: Self-pay | Admitting: Nurse Practitioner

## 2021-08-06 DIAGNOSIS — I739 Peripheral vascular disease, unspecified: Secondary | ICD-10-CM

## 2021-08-06 DIAGNOSIS — I6523 Occlusion and stenosis of bilateral carotid arteries: Secondary | ICD-10-CM

## 2021-08-09 ENCOUNTER — Ambulatory Visit (INDEPENDENT_AMBULATORY_CARE_PROVIDER_SITE_OTHER): Payer: PPO

## 2021-08-09 ENCOUNTER — Other Ambulatory Visit: Payer: Self-pay

## 2021-08-09 ENCOUNTER — Encounter (INDEPENDENT_AMBULATORY_CARE_PROVIDER_SITE_OTHER): Payer: Self-pay | Admitting: Nurse Practitioner

## 2021-08-09 ENCOUNTER — Ambulatory Visit (INDEPENDENT_AMBULATORY_CARE_PROVIDER_SITE_OTHER): Payer: PPO | Admitting: Nurse Practitioner

## 2021-08-09 VITALS — BP 128/76 | HR 88 | Resp 16 | Ht 61.0 in | Wt 129.0 lb

## 2021-08-09 DIAGNOSIS — E785 Hyperlipidemia, unspecified: Secondary | ICD-10-CM

## 2021-08-09 DIAGNOSIS — I6523 Occlusion and stenosis of bilateral carotid arteries: Secondary | ICD-10-CM

## 2021-08-09 DIAGNOSIS — I70213 Atherosclerosis of native arteries of extremities with intermittent claudication, bilateral legs: Secondary | ICD-10-CM

## 2021-08-09 DIAGNOSIS — I10 Essential (primary) hypertension: Secondary | ICD-10-CM

## 2021-08-09 DIAGNOSIS — I739 Peripheral vascular disease, unspecified: Secondary | ICD-10-CM | POA: Diagnosis not present

## 2021-08-12 ENCOUNTER — Ambulatory Visit: Payer: PPO

## 2021-08-15 ENCOUNTER — Encounter (INDEPENDENT_AMBULATORY_CARE_PROVIDER_SITE_OTHER): Payer: Self-pay | Admitting: Nurse Practitioner

## 2021-08-15 NOTE — Progress Notes (Signed)
Subjective:    Patient ID: Jasmine Acosta, female    DOB: [birth year], 70 y.o.   MRN: [id number] No chief complaint on file.   Jasmine Acosta is a 70 year old female that returns to the office for followup and review of the noninvasive studies. There have been no interval changes in lower extremity symptoms. No interval shortening of the patient's claudication distance or development of rest pain symptoms. No new ulcers or wounds have occurred since the last visit.   There have been no significant changes to the patient's overall health care.   The patient denies amaurosis fugax or recent TIA symptoms. There are no recent neurological changes noted. The patient denies history of DVT, PE or superficial thrombophlebitis. The patient denies recent episodes of angina or shortness of breath.    ABI Rt=1.17 and Lt=1.14  (previous ABI's Rt=0.99 and Lt=1.05) Duplex ultrasound of the triphasic tibial artery waveforms with good toe waveforms bilaterally   The patient is seen for follow up evaluation of carotid stenosis as well. The carotid stenosis followed by ultrasound.    The patient has had a previous right carotid endarterectomy.   The patient is taking enteric-coated aspirin 81 mg daily.   There is no history of migraine headaches. There is no history of seizures.       Carotid Duplex done today shows no evidence of stenosis in the right ICA just some minimal wall thickening.  The left ICA is consistent with a 1 to 39% stenosis.  The bilateral vertebral arteries demonstrate antegrade flow with normal flow hemodynamics in the bilateral subclavian arteries.  This consistent with previous studies done on 07/08/2020.   Review of Systems  Eyes:  Negative for visual disturbance.  All other systems reviewed and are negative.     Objective:   Physical Exam Vitals reviewed.  HENT:     Head: Normocephalic.  Neck:     Vascular: No carotid bruit.  Cardiovascular:     Rate and Rhythm: Normal  rate.     Pulses: Normal pulses.  Pulmonary:     Effort: Pulmonary effort is normal.  Skin:    General: Skin is warm and dry.     Capillary Refill: Capillary refill takes 2 to 3 seconds.  Neurological:     Mental Status: She is alert and oriented to person, place, and time.  Psychiatric:        Mood and Affect: Mood normal.        Behavior: Behavior normal.        Thought Content: Thought content normal.        Judgment: Judgment normal.    BP 128/76   Pulse 88   Resp 16   Ht 5\' 1"  (1.549 m)   Wt 129 lb (58.5 kg)   BMI 24.37 kg/m   Past Medical History:  Diagnosis Date   Carotid artery occlusion    COPD (chronic obstructive pulmonary disease) ([place]) 07/06/2015   Coronary artery disease    Headache    Hypertension    Hyperthyroidism    ablated with iodine   Peripheral vascular disease (HCC)    Bilateral Carotid Artery Disease   Personal history of arterial venous malformation (AVM)    Pseudoclaudication     Social History   Socioeconomic History   Marital status: Widowed    Spouse name: Not on file   Number of children: Not on file   Years of education: Not on file   Highest education level: Not  on file  Occupational History   Not on file  Tobacco Use   Smoking status: Former    Packs/day: 1.00    Years: 50.00    Pack years: 50.00    Types: Cigarettes    Quit date: 06/14/2018    Years since quitting: 3.1   Smokeless tobacco: Never   Tobacco comments:    since age 30  Vaping Use   Vaping Use: Never used  Substance and Sexual Activity   Alcohol use: No   Drug use: No   Sexual activity: Not Currently    Birth control/protection: Surgical  Other Topics Concern   Not on file  Social History Narrative   Not on file   Social Determinants of Health   Financial Resource Strain: Not on file  Food Insecurity: Not on file  Transportation Needs: Not on file  Physical Activity: Not on file  Stress: Not on file  Social Connections: Not on file  Intimate  Partner Violence: Not on file    Past Surgical History:  Procedure Laterality Date   ABDOMINAL HYSTERECTOMY     heavy bleeding   CATARACT EXTRACTION W/PHACO Right 10/09/2018   Procedure: CATARACT EXTRACTION PHACO AND INTRAOCULAR LENS PLACEMENT ([place]) RIGHT;  Surgeon: Leandrew Koyanagi, MD;  Location: [place];  Service: Ophthalmology;  Laterality: Right;   CATARACT EXTRACTION W/PHACO Left 01/01/2019   Procedure: CATARACT EXTRACTION PHACO AND INTRAOCULAR LENS PLACEMENT ([place])  LEFT;  Surgeon: Leandrew Koyanagi, MD;  Location: [place];  Service: Ophthalmology;  Laterality: Left;   COLONOSCOPY WITH PROPOFOL N/A 10/30/2019   Procedure: COLONOSCOPY WITH PROPOFOL;  Surgeon: Toledo, Benay Pike, MD;  Location: [facility] ENDOSCOPY;  Service: Gastroenterology;  Laterality: N/A;   CORONARY ANGIOPLASTY     ENDARTERECTOMY Right 06/14/2018   Procedure: ENDARTERECTOMY CAROTID;  Surgeon: Algernon Huxley, MD;  Location: [facility];  Service: Vascular;  Laterality: Right;   ESOPHAGOGASTRODUODENOSCOPY N/A 07/05/2019   Procedure: ESOPHAGOGASTRODUODENOSCOPY (EGD);  Surgeon: Lollie Sails, MD;  Location[facility] ENDOSCOPY;  Service: Endoscopy;  Laterality: N/A;   ESOPHAGOGASTRODUODENOSCOPY (EGD) WITH PROPOFOL N/A 07/08/2019   Procedure: ESOPHAGOGASTRODUODENOSCOPY (EGD) WITH PROPOFOL;  Surgeon: Lollie Sails, MD;  Location: [facility] ENDOSCOPY;  Service: Endoscopy;  Laterality: N/A;   EYE SURGERY     hallux vagus correction Right    HALLUX VALGUS AUSTIN Left 07/01/2016   Procedure: HALLUX VALGUS AUSTIN left foot;  Surgeon: Sharlotte Alamo, DPM;  Location: [facility];  Service: Podiatry;  Laterality: Left;   LOWER EXTREMITY ANGIOGRAPHY Right 11/05/2018   Procedure: LOWER EXTREMITY ANGIOGRAPHY;  Surgeon: Algernon Huxley, MD;  Location: [place] [facility];  Service: Cardiovascular;  Laterality: Right;    Family History  Problem Relation Age of Onset   Diabetes Mother    Emphysema Father    Heart disease  Sister    Heart disease Brother    Breast cancer Neg Hx    Ovarian cancer Neg Hx    Colon cancer Neg Hx     Allergies  Allergen Reactions   Lisinopril Other (See Comments)    Other reaction(s): Angioedema Of tongue only   Codeine Nausea And Vomiting    CBC Latest Ref Rng & Units 08/05/2021 05/31/2021 04/02/2021  WBC 4.0 - 10.5 K/uL 7.1 6.0 8.6  Hemoglobin 12.0 - 15.0 g/dL 7.8(L) 9.1(L) 12.3  Hematocrit 36.0 - 46.0 % 27.4(L) 30.5(L) 39.1  Platelets 150 - 400 K/uL 246 256 230      CMP     Component Value Date/Time  NA 137 06/11/2019 1215   K 3.2 (L) 06/11/2019 1215   CL 105 06/11/2019 1215   CO2 23 06/11/2019 1215   GLUCOSE 110 (H) 06/11/2019 1215   BUN 9 06/11/2019 1215   CREATININE 0.65 06/11/2019 1215   CALCIUM 9.2 06/11/2019 1215   PROT 7.7 06/11/2019 1215   ALBUMIN 4.2 06/11/2019 1215   AST 18 06/11/2019 1215   ALT 17 06/11/2019 1215   ALKPHOS 67 06/11/2019 1215   BILITOT 0.6 06/11/2019 1215   GFRNONAA >60 06/11/2019 1215   GFRAA >60 06/11/2019 1215     No results found.     Assessment & Plan:   1. Atherosclerosis of native artery of both lower extremities with intermittent claudication (HCC)  Recommend:  The patient has evidence of atherosclerosis of the lower extremities with claudication.  The patient does not voice lifestyle limiting changes at this point in time.  Noninvasive studies do not suggest clinically significant change.  No invasive studies, angiography or surgery at this time The patient should continue walking and begin a more formal exercise program.  The patient should continue antiplatelet therapy and aggressive treatment of the lipid abnormalities  No changes in the patient's medications at this time  The patient should continue wearing graduated compression socks 10-15 mmHg strength to control the mild edema.    Follow-up in 1 year with noninvasive studies  2. Bilateral carotid artery stenosis Recommend:  Given the patient's  asymptomatic subcritical stenosis no further invasive testing or surgery at this time.  Duplex ultrasound shows 1-39% stenosis bilaterally.  Continue antiplatelet therapy as prescribed Continue management of CAD, HTN and Hyperlipidemia Healthy heart diet,  encouraged exercise at least 4 times per week Follow up in 12 months with duplex ultrasound and physical exam    3. Essential hypertension Continue antihypertensive medications as already ordered, these medications have been reviewed and there are no changes at this time.   4. Hyperlipidemia, unspecified hyperlipidemia type Continue statin as ordered and reviewed, no changes at this time    Current Outpatient Medications on File Prior to Visit  Medication Sig Dispense Refill   amLODipine (NORVASC) 5 MG tablet Take 5 mg by mouth daily.   11   aspirin EC 81 MG tablet Take 1 tablet (81 mg total) by mouth daily. 150 tablet 2   clopidogrel (PLAVIX) 75 MG tablet Take 1 tablet (75 mg total) by mouth daily with breakfast. 30 tablet 11   meloxicam (MOBIC) 15 MG tablet Take by mouth.     Multiple Vitamin (MULTIVITAMIN) capsule Take 1 capsule by mouth daily.     potassium chloride (K-DUR,KLOR-CON) 10 MEQ tablet Take 10 mEq by mouth daily.     rosuvastatin (CRESTOR) 40 MG tablet Take 40 mg by mouth daily.     topiramate (TOPAMAX) 50 MG tablet Take 1 tablet by mouth 2 (two) times daily.     No current facility-administered medications on file prior to visit.    There are no Patient Instructions on file for this visit. No follow-ups on file.   Kris Hartmann, NP

## 2021-08-16 ENCOUNTER — Other Ambulatory Visit: Payer: Self-pay

## 2021-08-16 ENCOUNTER — Inpatient Hospital Stay: Payer: PPO

## 2021-08-16 VITALS — BP 134/59 | HR 96 | Resp 18

## 2021-08-16 DIAGNOSIS — I208 Other forms of angina pectoris: Secondary | ICD-10-CM | POA: Diagnosis not present

## 2021-08-16 DIAGNOSIS — R011 Cardiac murmur, unspecified: Secondary | ICD-10-CM | POA: Diagnosis not present

## 2021-08-16 DIAGNOSIS — I779 Disorder of arteries and arterioles, unspecified: Secondary | ICD-10-CM | POA: Diagnosis not present

## 2021-08-16 DIAGNOSIS — D509 Iron deficiency anemia, unspecified: Secondary | ICD-10-CM

## 2021-08-16 DIAGNOSIS — R0602 Shortness of breath: Secondary | ICD-10-CM | POA: Diagnosis not present

## 2021-08-16 DIAGNOSIS — I10 Essential (primary) hypertension: Secondary | ICD-10-CM | POA: Diagnosis not present

## 2021-08-16 DIAGNOSIS — J449 Chronic obstructive pulmonary disease, unspecified: Secondary | ICD-10-CM | POA: Diagnosis not present

## 2021-08-16 DIAGNOSIS — I739 Peripheral vascular disease, unspecified: Secondary | ICD-10-CM | POA: Diagnosis not present

## 2021-08-16 DIAGNOSIS — I70219 Atherosclerosis of native arteries of extremities with intermittent claudication, unspecified extremity: Secondary | ICD-10-CM | POA: Diagnosis not present

## 2021-08-16 DIAGNOSIS — Z87891 Personal history of nicotine dependence: Secondary | ICD-10-CM | POA: Diagnosis not present

## 2021-08-16 MED ORDER — SODIUM CHLORIDE 0.9 % IV SOLN
510.0000 mg | Freq: Once | INTRAVENOUS | Status: AC
  Administered 2021-08-16: 510 mg via INTRAVENOUS
  Filled 2021-08-16: qty 510

## 2021-08-16 MED ORDER — SODIUM CHLORIDE 0.9 % IV SOLN
INTRAVENOUS | Status: DC
  Filled 2021-08-16: qty 250

## 2021-08-23 ENCOUNTER — Inpatient Hospital Stay: Payer: PPO

## 2021-08-23 VITALS — BP 133/78 | HR 98 | Temp 98.2°F | Resp 18

## 2021-08-23 DIAGNOSIS — D509 Iron deficiency anemia, unspecified: Secondary | ICD-10-CM

## 2021-08-23 MED ORDER — SODIUM CHLORIDE 0.9 % IV SOLN
510.0000 mg | Freq: Once | INTRAVENOUS | Status: AC
  Administered 2021-08-23: 510 mg via INTRAVENOUS
  Filled 2021-08-23: qty 510

## 2021-08-23 MED ORDER — SODIUM CHLORIDE 0.9 % IV SOLN
INTRAVENOUS | Status: DC
  Filled 2021-08-23: qty 250

## 2021-09-02 DIAGNOSIS — R0602 Shortness of breath: Secondary | ICD-10-CM | POA: Diagnosis not present

## 2021-09-02 DIAGNOSIS — I208 Other forms of angina pectoris: Secondary | ICD-10-CM | POA: Diagnosis not present

## 2021-09-03 ENCOUNTER — Other Ambulatory Visit (INDEPENDENT_AMBULATORY_CARE_PROVIDER_SITE_OTHER): Payer: Self-pay | Admitting: Vascular Surgery

## 2021-09-03 DIAGNOSIS — I70209 Unspecified atherosclerosis of native arteries of extremities, unspecified extremity: Secondary | ICD-10-CM

## 2021-09-03 DIAGNOSIS — I7090 Unspecified atherosclerosis: Secondary | ICD-10-CM

## 2021-09-06 ENCOUNTER — Ambulatory Visit (INDEPENDENT_AMBULATORY_CARE_PROVIDER_SITE_OTHER): Payer: PPO | Admitting: Nurse Practitioner

## 2021-09-06 ENCOUNTER — Ambulatory Visit (INDEPENDENT_AMBULATORY_CARE_PROVIDER_SITE_OTHER): Payer: PPO

## 2021-09-06 ENCOUNTER — Other Ambulatory Visit: Payer: Self-pay

## 2021-09-06 VITALS — BP 149/83 | HR 86 | Ht 60.0 in | Wt 127.0 lb

## 2021-09-06 DIAGNOSIS — I7090 Unspecified atherosclerosis: Secondary | ICD-10-CM | POA: Diagnosis not present

## 2021-09-06 DIAGNOSIS — E785 Hyperlipidemia, unspecified: Secondary | ICD-10-CM | POA: Diagnosis not present

## 2021-09-06 DIAGNOSIS — I10 Essential (primary) hypertension: Secondary | ICD-10-CM | POA: Diagnosis not present

## 2021-09-06 DIAGNOSIS — I70209 Unspecified atherosclerosis of native arteries of extremities, unspecified extremity: Secondary | ICD-10-CM

## 2021-09-08 DIAGNOSIS — I739 Peripheral vascular disease, unspecified: Secondary | ICD-10-CM | POA: Diagnosis not present

## 2021-09-08 DIAGNOSIS — I6523 Occlusion and stenosis of bilateral carotid arteries: Secondary | ICD-10-CM | POA: Diagnosis not present

## 2021-09-08 DIAGNOSIS — I10 Essential (primary) hypertension: Secondary | ICD-10-CM | POA: Diagnosis not present

## 2021-09-08 DIAGNOSIS — J449 Chronic obstructive pulmonary disease, unspecified: Secondary | ICD-10-CM | POA: Diagnosis not present

## 2021-09-08 DIAGNOSIS — I700 Atherosclerosis of aorta: Secondary | ICD-10-CM | POA: Diagnosis not present

## 2021-09-08 DIAGNOSIS — I2510 Atherosclerotic heart disease of native coronary artery without angina pectoris: Secondary | ICD-10-CM | POA: Diagnosis not present

## 2021-09-12 ENCOUNTER — Encounter (INDEPENDENT_AMBULATORY_CARE_PROVIDER_SITE_OTHER): Payer: Self-pay | Admitting: Nurse Practitioner

## 2021-09-12 NOTE — Progress Notes (Signed)
Subjective:    Patient ID: Jasmine Acosta, female    DOB: [birth year], 70 y.o.   MRN: [id number] Chief Complaint  Patient presents with   Follow-up    Pt conov aorta iliac U/S w.toe lifts     Ryelee Primmer  There have been no interval changes in lower extremity symptoms. No interval shortening of the patient's claudication distance or development of rest pain symptoms. No new ulcers or wounds have occurred since the last visit.  There have been no significant changes to the patient's overall health care.  The patient denies amaurosis fugax or recent TIA symptoms. There are no recent neurological changes noted. The patient denies history of DVT, PE or superficial thrombophlebitis. The patient denies recent episodes of angina or shortness of breath.   ABI Rt=1.09 and Lt=1.02  (previous ABI's Rt=1.17 and Lt=1.14) Duplex ultrasound of the right lower extremity shows triphasic waveforms with biphasic/triphasic waveforms in the left the patient has good toe waveforms bilaterally.  Aortoiliac duplex shows no evidence of aortic aneurysm.  No evidence of significant stenosis although the right iliac arteries have some elevated velocities but nothing significant.  It is noted that these are calcific.   Review of Systems  All other systems reviewed and are negative.     Objective:   Physical Exam Vitals reviewed.  HENT:     Head: Normocephalic.  Cardiovascular:     Rate and Rhythm: Normal rate.     Pulses: Normal pulses.  Pulmonary:     Effort: Pulmonary effort is normal.  Skin:    General: Skin is warm and dry.  Neurological:     Mental Status: She is alert and oriented to person, place, and time.  Psychiatric:        Mood and Affect: Mood normal.        Behavior: Behavior normal.        Thought Content: Thought content normal.        Judgment: Judgment normal.    BP (!) 149/83   Pulse 86   Ht 5' (1.524 m)   Wt 127 lb (57.6 kg)   BMI 24.80 kg/m   Past Medical History:   Diagnosis Date   Carotid artery occlusion    COPD (chronic obstructive pulmonary disease) ([place]) 07/06/2015   Coronary artery disease    Headache    Hypertension    Hyperthyroidism    ablated with iodine   Peripheral vascular disease (HCC)    Bilateral Carotid Artery Disease   Personal history of arterial venous malformation (AVM)    Pseudoclaudication     Social History   Socioeconomic History   Marital status: Widowed    Spouse name: Not on file   Number of children: Not on file   Years of education: Not on file   Highest education level: Not on file  Occupational History   Not on file  Tobacco Use   Smoking status: Former    Packs/day: 1.00    Years: 50.00    Pack years: 50.00    Types: Cigarettes    Quit date: 06/14/2018    Years since quitting: 3.2   Smokeless tobacco: Never   Tobacco comments:    since age 10  Vaping Use   Vaping Use: Never used  Substance and Sexual Activity   Alcohol use: No   Drug use: No   Sexual activity: Not Currently    Birth control/protection: Surgical  Other Topics Concern   Not on file  Social History  Narrative   Not on file   Social Determinants of Health   Financial Resource Strain: Not on file  Food Insecurity: Not on file  Transportation Needs: Not on file  Physical Activity: Not on file  Stress: Not on file  Social Connections: Not on file  Intimate Partner Violence: Not on file    Past Surgical History:  Procedure Laterality Date   ABDOMINAL HYSTERECTOMY     heavy bleeding   CATARACT EXTRACTION W/PHACO Right 10/09/2018   Procedure: CATARACT EXTRACTION PHACO AND INTRAOCULAR LENS PLACEMENT ([place]) RIGHT;  Surgeon: Leandrew Koyanagi, MD;  Location: [place];  Service: Ophthalmology;  Laterality: Right;   CATARACT EXTRACTION W/PHACO Left 01/01/2019   Procedure: CATARACT EXTRACTION PHACO AND INTRAOCULAR LENS PLACEMENT ([place])  LEFT;  Surgeon: Leandrew Koyanagi, MD;  Location: [place];  Service:  Ophthalmology;  Laterality: Left;   COLONOSCOPY WITH PROPOFOL N/A 10/30/2019   Procedure: COLONOSCOPY WITH PROPOFOL;  Surgeon: Toledo, Benay Pike, MD;  Location: [facility];  Service: Gastroenterology;  Laterality: N/A;   CORONARY ANGIOPLASTY     ENDARTERECTOMY Right 06/14/2018   Procedure: ENDARTERECTOMY CAROTID;  Surgeon: Algernon Huxley, MD;  Location: [facility];  Service: Vascular;  Laterality: Right;   ESOPHAGOGASTRODUODENOSCOPY N/A 07/05/2019   Procedure: ESOPHAGOGASTRODUODENOSCOPY (EGD);  Surgeon: Lollie Sails, MD;  Location: [facility];  Service: Endoscopy;  Laterality: N/A;   ESOPHAGOGASTRODUODENOSCOPY (EGD) WITH PROPOFOL N/A 07/08/2019   Procedure: ESOPHAGOGASTRODUODENOSCOPY (EGD) WITH PROPOFOL;  Surgeon: Lollie Sails, MD;  Location: [facility] ENDOSCOPY;  Service: Endoscopy;  Laterality: N/A;   EYE SURGERY     hallux vagus correction Right    HALLUX VALGUS AUSTIN Left 07/01/2016   Procedure: HALLUX VALGUS AUSTIN left foot;  Surgeon: Sharlotte Alamo, DPM;  Location: [facility];  Service: Podiatry;  Laterality: Left;   LOWER EXTREMITY ANGIOGRAPHY Right 11/05/2018   Procedure: LOWER EXTREMITY ANGIOGRAPHY;  Surgeon: Algernon Huxley, MD;  Location: [place] [facility];  Service: Cardiovascular;  Laterality: Right;    Family History  Problem Relation Age of Onset   Diabetes Mother    Emphysema Father    Heart disease Sister    Heart disease Brother    Breast cancer Neg Hx    Ovarian cancer Neg Hx    Colon cancer Neg Hx     Allergies  Allergen Reactions   Lisinopril Other (See Comments)    Other reaction(s): Angioedema Of tongue only   Codeine Nausea And Vomiting    CBC Latest Ref Rng & Units 08/05/2021 05/31/2021 04/02/2021  WBC 4.0 - 10.5 K/uL 7.1 6.0 8.6  Hemoglobin 12.0 - 15.0 g/dL 7.8(L) 9.1(L) 12.3  Hematocrit 36.0 - 46.0 % 27.4(L) 30.5(L) 39.1  Platelets 150 - 400 K/uL 246 256 230      CMP     Component Value Date/Time   NA 137 06/11/2019 1215   K 3.2 (L) 06/11/2019  1215   CL 105 06/11/2019 1215   CO2 23 06/11/2019 1215   GLUCOSE 110 (H) 06/11/2019 1215   BUN 9 06/11/2019 1215   CREATININE 0.65 06/11/2019 1215   CALCIUM 9.2 06/11/2019 1215   PROT 7.7 06/11/2019 1215   ALBUMIN 4.2 06/11/2019 1215   AST 18 06/11/2019 1215   ALT 17 06/11/2019 1215   ALKPHOS 67 06/11/2019 1215   BILITOT 0.6 06/11/2019 1215   GFRNONAA >60 06/11/2019 1215   GFRAA >60 06/11/2019 1215     VAS [place] ABI WITH/WO TBI  Result Date: 08/19/2021  LOWER EXTREMITY DOPPLER STUDY  Patient Name:  Jasmine Acosta  Date of Exam:   08/09/2021 Medical Rec #: [id number]      Accession #:    [id number] Date of Birth: 08/19/1951       Patient Gender: F Patient Age:   63 years Exam Location:  [facility] Procedure:      VAS [place] ABI WITH/WO TBI Referring Phys: --------------------------------------------------------------------------------  Indications: Peripheral artery disease.  Vascular Interventions: 11/05/18: Bilateral CIA stents;. Comparison Study: 02/08/2021 Performing Technologist: Concha Norway RVT  Examination Guidelines: A complete evaluation includes at minimum, Doppler waveform signals and systolic blood pressure reading at the level of bilateral brachial, anterior tibial, and posterior tibial arteries, when vessel segments are accessible. Bilateral testing is considered an integral part of a complete examination. Photoelectric Plethysmograph (PPG) waveforms and toe systolic pressure readings are included as required and additional duplex testing as needed. Limited examinations for reoccurring indications may be performed as noted.  ABI Findings: +---------+------------------+-----+---------+--------+ Right    Rt Pressure (mmHg)IndexWaveform Comment  +---------+------------------+-----+---------+--------+ Brachial 113                                      +---------+------------------+-----+---------+--------+ ATA      121                    triphasic1.07      +---------+------------------+-----+---------+--------+ PTA      132               1.17 triphasic         +---------+------------------+-----+---------+--------+ Great Toe92                0.81 Normal            +---------+------------------+-----+---------+--------+ +---------+------------------+-----+---------+-------+ Left     Lt Pressure (mmHg)IndexWaveform Comment +---------+------------------+-----+---------+-------+ Brachial 113                                     +---------+------------------+-----+---------+-------+ ATA      136                    triphasic        +---------+------------------+-----+---------+-------+ PTA      129               1.14 triphasic        +---------+------------------+-----+---------+-------+ Great Toe80                0.71 Normal           +---------+------------------+-----+---------+-------+ +-------+-----------+-----------+------------+------------+ ABI/TBIToday's ABIToday's TBIPrevious ABIPrevious TBI +-------+-----------+-----------+------------+------------+ Right  1.17       .81        .98         .72          +-------+-----------+-----------+------------+------------+ Left   1.14       .71        1.05        .78          +-------+-----------+-----------+------------+------------+  Bilateral ABIs and TBIs appear essentially unchanged compared to prior study on 01/2021.  Summary: Right: Resting right ankle-brachial index is within normal range. No evidence of significant right lower extremity arterial disease. The right toe-brachial index is normal. Left: Resting left ankle-brachial index is within normal range. No evidence of significant left lower extremity arterial disease. The left toe-brachial index  is normal.  *See table(s) above for measurements and observations.  Electronically signed by Hortencia Pilar MD on 08/19/2021 at 4:32:26 PM.    Final        Assessment & Plan:   1. Atherosclerosis of arteries  of extremities (HCC) Currently the aortic iliac duplex shows open and patent stents.  There is evidence of calcified plaque within the iliac arteries and velocities are somewhat elevated but not extreme.  ABIs are stable today.  We will have the patient return to the office in 1 year for noninvasive studies unless claudication symptoms worsen or she develops rest pain, wounds or ulcerations.  2. Essential hypertension Continue antihypertensive medications as already ordered, these medications have been reviewed and there are no changes at this time.   3. Hyperlipidemia, unspecified hyperlipidemia type Continue statin as ordered and reviewed, no changes at this time    Current Outpatient Medications on File Prior to Visit  Medication Sig Dispense Refill   amLODipine (NORVASC) 5 MG tablet Take 5 mg by mouth daily.   11   aspirin EC 81 MG tablet Take 1 tablet (81 mg total) by mouth daily. 150 tablet 2   clopidogrel (PLAVIX) 75 MG tablet Take 1 tablet (75 mg total) by mouth daily with breakfast. 30 tablet 11   meloxicam (MOBIC) 15 MG tablet Take by mouth.     Multiple Vitamin (MULTIVITAMIN) capsule Take 1 capsule by mouth daily.     potassium chloride (K-DUR,KLOR-CON) 10 MEQ tablet Take 10 mEq by mouth daily.     rosuvastatin (CRESTOR) 40 MG tablet Take 40 mg by mouth daily.     topiramate (TOPAMAX) 50 MG tablet Take 1 tablet by mouth 2 (two) times daily.     No current facility-administered medications on file prior to visit.    There are no Patient Instructions on file for this visit. No follow-ups on file.   Kris Hartmann, NP

## 2021-09-20 ENCOUNTER — Inpatient Hospital Stay (HOSPITAL_BASED_OUTPATIENT_CLINIC_OR_DEPARTMENT_OTHER): Payer: PPO | Admitting: Nurse Practitioner

## 2021-09-20 ENCOUNTER — Inpatient Hospital Stay: Payer: PPO | Attending: Oncology

## 2021-09-20 ENCOUNTER — Other Ambulatory Visit: Payer: Self-pay

## 2021-09-20 ENCOUNTER — Inpatient Hospital Stay: Payer: PPO

## 2021-09-20 VITALS — BP 136/66 | HR 68 | Temp 97.2°F | Resp 16 | Wt 128.5 lb

## 2021-09-20 DIAGNOSIS — D509 Iron deficiency anemia, unspecified: Secondary | ICD-10-CM

## 2021-09-20 DIAGNOSIS — J449 Chronic obstructive pulmonary disease, unspecified: Secondary | ICD-10-CM | POA: Insufficient documentation

## 2021-09-20 DIAGNOSIS — I10 Essential (primary) hypertension: Secondary | ICD-10-CM | POA: Diagnosis not present

## 2021-09-20 LAB — CBC WITH DIFFERENTIAL/PLATELET
Abs Immature Granulocytes: 0.01 10*3/uL (ref 0.00–0.07)
Basophils Absolute: 0.1 10*3/uL (ref 0.0–0.1)
Basophils Relative: 1 %
Eosinophils Absolute: 0.2 10*3/uL (ref 0.0–0.5)
Eosinophils Relative: 3 %
HCT: 38.1 % (ref 36.0–46.0)
Hemoglobin: 11.4 g/dL — ABNORMAL LOW (ref 12.0–15.0)
Immature Granulocytes: 0 %
Lymphocytes Relative: 26 %
Lymphs Abs: 1.8 10*3/uL (ref 0.7–4.0)
MCH: 26.9 pg (ref 26.0–34.0)
MCHC: 29.9 g/dL — ABNORMAL LOW (ref 30.0–36.0)
MCV: 89.9 fL (ref 80.0–100.0)
Monocytes Absolute: 0.4 10*3/uL (ref 0.1–1.0)
Monocytes Relative: 6 %
Neutro Abs: 4.4 10*3/uL (ref 1.7–7.7)
Neutrophils Relative %: 64 %
Platelets: 210 10*3/uL (ref 150–400)
RBC: 4.24 MIL/uL (ref 3.87–5.11)
RDW: 23.4 % — ABNORMAL HIGH (ref 11.5–15.5)
WBC: 6.8 10*3/uL (ref 4.0–10.5)
nRBC: 0 % (ref 0.0–0.2)

## 2021-09-20 LAB — IRON AND TIBC
Iron: 56 ug/dL (ref 28–170)
Saturation Ratios: 18 % (ref 10.4–31.8)
TIBC: 307 ug/dL (ref 250–450)
UIBC: 251 ug/dL

## 2021-09-20 LAB — FERRITIN: Ferritin: 90 ng/mL (ref 11–307)

## 2021-09-20 NOTE — Progress Notes (Signed)
[place]  Telephone:(336) [phone] Fax:(336) [phone]  ID: Jasmine Acosta OB: Aug 09, 1951  MR#: [id number]  MKL#:[id number]  Patient Care Team: Kirk Ruths, MD as PCP - General (Internal Medicine) Lloyd Huger, MD as Consulting Physician (Oncology)  CHIEF COMPLAINT: Iron deficiency anemia.  INTERVAL HISTORY: Patient is 70 year old female with history of hypertension, atherosclerosis, PVD, COPD, tobacco abuse, who returns to clinic for discussion of lab results and consideration of IV iron. She continues to feel well and denies specific complaints. Denies any neurologic complaints. Denies recent fevers or illnesses. Denies any easy bleeding or bruising. No melena or hematochezia. No pica or restless leg. Reports good appetite and denies weight loss. Denies chest pain. Denies any nausea, vomiting, constipation, or diarrhea. Denies urinary complaints. Patient offers no further specific complaints today.  REVIEW OF SYSTEMS:   Review of Systems  Constitutional:  Negative for chills, fever, malaise/fatigue and weight loss.  HENT:  Negative for hearing loss, nosebleeds, sore throat and tinnitus.   Eyes:  Negative for blurred vision and double vision.  Respiratory:  Negative for cough, hemoptysis, shortness of breath and wheezing.   Cardiovascular:  Negative for chest pain, palpitations and leg swelling.  Gastrointestinal:  Negative for abdominal pain, blood in stool, constipation, diarrhea, melena, nausea and vomiting.  Genitourinary:  Negative for dysuria and urgency.  Musculoskeletal:  Negative for back pain, falls, joint pain and myalgias.  Skin:  Negative for itching and rash.  Neurological:  Negative for dizziness, tingling, sensory change, loss of consciousness, weakness and headaches.  Endo/Heme/Allergies:  Negative for environmental allergies. Does not bruise/bleed easily.  Psychiatric/Behavioral:  Negative for depression. The patient is not  nervous/anxious and does not have insomnia.   All other systems reviewed and are negative.  As per HPI. Otherwise, a complete review of systems is negative.  PAST MEDICAL HISTORY: Past Medical History:  Diagnosis Date   Carotid artery occlusion    COPD (chronic obstructive pulmonary disease) ([place]) 07/06/2015   Coronary artery disease    Headache    Hypertension    Hyperthyroidism    ablated with iodine   Peripheral vascular disease (HCC)    Bilateral Carotid Artery Disease   Personal history of arterial venous malformation (AVM)    Pseudoclaudication     PAST SURGICAL HISTORY: Past Surgical History:  Procedure Laterality Date   ABDOMINAL HYSTERECTOMY     heavy bleeding   CATARACT EXTRACTION W/PHACO Right 10/09/2018   Procedure: CATARACT EXTRACTION PHACO AND INTRAOCULAR LENS PLACEMENT ([place]) RIGHT;  Surgeon: Leandrew Koyanagi, MD;  Location: [place];  Service: Ophthalmology;  Laterality: Right;   CATARACT EXTRACTION W/PHACO Left 01/01/2019   Procedure: CATARACT EXTRACTION PHACO AND INTRAOCULAR LENS PLACEMENT ([place])  LEFT;  Surgeon: Leandrew Koyanagi, MD;  Location: [place];  Service: Ophthalmology;  Laterality: Left;   COLONOSCOPY WITH PROPOFOL N/A 10/30/2019   Procedure: COLONOSCOPY WITH PROPOFOL;  Surgeon: Toledo, Benay Pike, MD;  Location: [facility] ENDOSCOPY;  Service: Gastroenterology;  Laterality: N/A;   CORONARY ANGIOPLASTY     ENDARTERECTOMY Right 06/14/2018   Procedure: ENDARTERECTOMY CAROTID;  Surgeon: Algernon Huxley, MD;  Location: [facility] ORS;  Service: Vascular;  Laterality: Right;   ESOPHAGOGASTRODUODENOSCOPY N/A 07/05/2019   Procedure: ESOPHAGOGASTRODUODENOSCOPY (EGD);  Surgeon: Lollie Sails, MD;  [facility];  Service: Endoscopy;  Laterality: N/A;   ESOPHAGOGASTRODUODENOSCOPY (EGD) WITH PROPOFOL N/A 07/08/2019   Procedure: ESOPHAGOGASTRODUODENOSCOPY (EGD) WITH PROPOFOL;  Surgeon: Lollie Sails, MD;  Location: [facility] ENDOSCOPY;   Service: Endoscopy;  Laterality: N/A;   EYE SURGERY     hallux vagus correction Right    HALLUX VALGUS AUSTIN Left 07/01/2016   Procedure: HALLUX VALGUS AUSTIN left foot;  Surgeon: Sharlotte Alamo, DPM;  Location: [facility] ORS;  Service: Podiatry;  Laterality: Left;   LOWER EXTREMITY ANGIOGRAPHY Right 11/05/2018   Procedure: LOWER EXTREMITY ANGIOGRAPHY;  Surgeon: Algernon Huxley, MD;  Location: [place] CV LAB;  Service: Cardiovascular;  Laterality: Right;    FAMILY HISTORY: Family History  Problem Relation Age of Onset   Diabetes Mother    Emphysema Father    Heart disease Sister    Heart disease Brother    Breast cancer Neg Hx    Ovarian cancer Neg Hx    Colon cancer Neg Hx     ADVANCED DIRECTIVES (Y/N):  N  HEALTH MAINTENANCE: Social History   Tobacco Use   Smoking status: Former    Packs/day: 1.00    Years: 50.00    Pack years: 50.00    Types: Cigarettes    Quit date: 06/14/2018    Years since quitting: 3.2   Smokeless tobacco: Never   Tobacco comments:    since age 84  Vaping Use   Vaping Use: Never used  Substance Use Topics   Alcohol use: No   Drug use: No    Colonoscopy:  PAP:  Bone density:  Lipid panel:  Allergies  Allergen Reactions   Lisinopril Other (See Comments)    Other reaction(s): Angioedema Of tongue only   Codeine Nausea And Vomiting    Current Outpatient Medications  Medication Sig Dispense Refill   amLODipine (NORVASC) 5 MG tablet Take 5 mg by mouth daily.   11   aspirin EC 81 MG tablet Take 1 tablet (81 mg total) by mouth daily. 150 tablet 2   clopidogrel (PLAVIX) 75 MG tablet Take 1 tablet (75 mg total) by mouth daily with breakfast. 30 tablet 11   meloxicam (MOBIC) 15 MG tablet Take by mouth.     Multiple Vitamin (MULTIVITAMIN) capsule Take 1 capsule by mouth daily.     potassium chloride (K-DUR,KLOR-CON) 10 MEQ tablet Take 10 mEq by mouth daily.     rosuvastatin (CRESTOR) 40 MG tablet Take 40 mg by mouth daily.     topiramate (TOPAMAX)  50 MG tablet Take 1 tablet by mouth 2 (two) times daily.     No current facility-administered medications for this visit.    OBJECTIVE: Vitals:   09/20/21 1322  BP: 136/66  Pulse: 68  Resp: 16  Temp: (!) 97.2 F (36.2 C)  SpO2: 100%     Body mass index is 25.1 kg/m.    ECOG FS:0 - Asymptomatic  Physical Exam Constitutional:      Appearance: Normal appearance.  HENT:     Head: Normocephalic and atraumatic.  Eyes:     Pupils: Pupils are equal, round, and reactive to light.  Cardiovascular:     Rate and Rhythm: Normal rate and regular rhythm.     Heart sounds: Normal heart sounds. No murmur heard. Pulmonary:     Effort: Pulmonary effort is normal.     Breath sounds: Normal breath sounds. No wheezing.  Abdominal:     General: Bowel sounds are normal. There is no distension.     Palpations: Abdomen is soft.     Tenderness: There is no abdominal tenderness.  Musculoskeletal:        General: Normal range of motion.     Cervical back: Normal range  of motion.  Skin:    General: Skin is warm and dry.     Findings: No rash.  Neurological:     Mental Status: She is alert and oriented to person, place, and time.  Psychiatric:        Judgment: Judgment normal.    LAB RESULTS:  Lab Results  Component Value Date   NA 137 06/11/2019   K 3.2 (L) 06/11/2019   CL 105 06/11/2019   CO2 23 06/11/2019   GLUCOSE 110 (H) 06/11/2019   BUN 9 06/11/2019   CREATININE 0.65 06/11/2019   CALCIUM 9.2 06/11/2019   PROT 7.7 06/11/2019   ALBUMIN 4.2 06/11/2019   AST 18 06/11/2019   ALT 17 06/11/2019   ALKPHOS 67 06/11/2019   BILITOT 0.6 06/11/2019   GFRNONAA >60 06/11/2019   GFRAA >60 06/11/2019    Lab Results  Component Value Date   WBC 6.8 09/20/2021   NEUTROABS 4.4 09/20/2021   HGB 11.4 (L) 09/20/2021   HCT 38.1 09/20/2021   MCV 89.9 09/20/2021   PLT 210 09/20/2021   Lab Results  Component Value Date   IRON 56 09/20/2021   TIBC 307 09/20/2021   IRONPCTSAT 18 09/20/2021    Lab Results  Component Value Date   FERRITIN 90 09/20/2021     STUDIES: VAS [place] ABI WITH/WO TBI  Result Date: 09/13/2021  LOWER EXTREMITY DOPPLER STUDY Patient Name:  Alicja Everitt  Date of Exam:   09/06/2021 Medical Rec #: [id number]      Accession #:    [id number] Date of Birth: 28-Jul-1951       Patient Gender: F Patient Age:   23 years Exam Location:  [facility] Procedure:      VAS [place] ABI WITH/WO TBI Referring Phys: Leotis Pain --------------------------------------------------------------------------------  Indications: Peripheral artery disease.  Vascular Interventions: 11/05/18: Bilateral CIA stents;. Comparison Study: 08/09/2021 Performing Technologist: Almira Coaster RVS  Examination Guidelines: A complete evaluation includes at minimum, Doppler waveform signals and systolic blood pressure reading at the level of bilateral brachial, anterior tibial, and posterior tibial arteries, when vessel segments are accessible. Bilateral testing is considered an integral part of a complete examination. Photoelectric Plethysmograph (PPG) waveforms and toe systolic pressure readings are included as required and additional duplex testing as needed. Limited examinations for reoccurring indications may be performed as noted.  ABI Findings: +---------+------------------+-----+---------+--------+ Right    Rt Pressure (mmHg)IndexWaveform Comment  +---------+------------------+-----+---------+--------+ Brachial 128                                      +---------+------------------+-----+---------+--------+ ATA      121                    triphasic.95      +---------+------------------+-----+---------+--------+ PTA      139               1.09 triphasic         +---------+------------------+-----+---------+--------+ Great Toe118               0.92 Normal            +---------+------------------+-----+---------+--------+ +---------+------------------+-----+---------+-------+ Left      Lt Pressure (mmHg)IndexWaveform Comment +---------+------------------+-----+---------+-------+ Brachial 124                                     +---------+------------------+-----+---------+-------+  ATA      131                    biphasic 1.02    +---------+------------------+-----+---------+-------+ PTA      130               1.02 triphasic        +---------+------------------+-----+---------+-------+ Great Toe131               1.02 Normal           +---------+------------------+-----+---------+-------+ +-------+-----------+-----------+------------+------------+ ABI/TBIToday's ABIToday's TBIPrevious ABIPrevious TBI +-------+-----------+-----------+------------+------------+ Right  1.09       .92        1.17        .81          +-------+-----------+-----------+------------+------------+ Left   1.02       1.02       1.14        .71          +-------+-----------+-----------+------------+------------+  Bilateral ABIs appear essentially unchanged compared to prior study on 08/09/2021. Bilateral TBIs appear increased compared to prior study on 08/09/2021.  Summary: Right: Resting right ankle-brachial index is within normal range. No evidence of significant right lower extremity arterial disease. The right toe-brachial index is normal. Left: Resting left ankle-brachial index is within normal range. No evidence of significant left lower extremity arterial disease. The left toe-brachial index is normal.  *See table(s) above for measurements and observations.  Electronically signed by Leotis Pain MD on 09/13/2021 at 11:22:21 AM.    Final    VAS US AORTA/IVC/ILIACS  Result Date: 09/13/2021 ABDOMINAL AORTA STUDY Patient Name:  JANEL BEANE  Date of Exam:   09/06/2021 Medical Rec #: [id number]      Accession #:    [id number] Date of Birth: Dec 06, 1950       Patient Gender: F Patient Age:   18 years Exam Location:  [facility] Procedure:      VAS US  AORTA/IVC/ILIACS Referring Phys: Leotis Pain --------------------------------------------------------------------------------  Performing Technologist: Almira Coaster RVS  Examination Guidelines: A complete evaluation includes B-mode imaging, spectral Doppler, color Doppler, and power Doppler as needed of all accessible portions of each vessel. Bilateral testing is considered an integral part of a complete examination. Limited examinations for reoccurring indications may be performed as noted.  Abdominal Aorta Findings: +-------------+-------+----------+----------+----------+--------+--------+ Location     AP (cm)Trans (cm)PSV (cm/s)Waveform  ThrombusComments +-------------+-------+----------+----------+----------+--------+--------+ Proximal     2.18   2.30      41        monophasic                 +-------------+-------+----------+----------+----------+--------+--------+ Mid          1.92   2.14      45        monophasic                 +-------------+-------+----------+----------+----------+--------+--------+ Distal       1.59   1.73      61        monophasic                 +-------------+-------+----------+----------+----------+--------+--------+ RT CIA Prox  0.7    0.8       141       triphasic                  +-------------+-------+----------+----------+----------+--------+--------+ RT CIA Distal0.6    0.6       197  triphasic                  +-------------+-------+----------+----------+----------+--------+--------+ RT EIA Prox  0.8    0.7       190       triphasic                  +-------------+-------+----------+----------+----------+--------+--------+ RT EIA Distal0.7    0.7       199       triphasic                  +-------------+-------+----------+----------+----------+--------+--------+ LT CIA Prox  0.8    0.8       123       biphasic                   +-------------+-------+----------+----------+----------+--------+--------+  LT CIA Distal0.8    0.8       155       triphasic                  +-------------+-------+----------+----------+----------+--------+--------+ LT EIA Prox  0.5    0.8       135       biphasic                   +-------------+-------+----------+----------+----------+--------+--------+ LT EIA Distal0.5    0.5       111       triphasic                  +-------------+-------+----------+----------+----------+--------+--------+  Summary: Abdominal Aorta: The largest aortic measurement is 2.3 cm. Stenosis: There appears to be Calcified plaque seen in the Aorta,Both Common Iliac Arteries and Both External Iliac Arteries; No Extreme Velocities Noted.  *See table(s) above for measurements and observations.  Electronically signed by Leotis Pain MD on 09/13/2021 at 11:22:03 AM.    Final     ASSESSMENT: Iron deficiency anemia.  PLAN:    1.  Iron deficiency anemia: Upper endoscopy 07/08/2019 showed area of angiectasia in duodendum which was treated. Colonoscopy 10/28/19 was negative. Unable to tolerate oral iron d/t constipation. Received feraheme 510 mg x 2 in October 2022. Clinically improved. Hemoglobin has improved to 11.4 (previously 7.8). Ferritin 90 (previously 4). Iron saturation and TIBC have normalized. No IV iron at this time.   Disposition: Rtc in 3 months for labs (cbc, ferritin, iron studies). Day to week later see Dr Grayland Ormond, +/- feraheme  Patient expressed understanding and was in agreement with this plan. She also understands that She can call clinic at any time with any questions, concerns, or complaints.   Verlon Au, NP   09/20/2021

## 2021-09-20 NOTE — Progress Notes (Signed)
Pt reports chronic neck, back and leg pain. No other concerns at this time

## 2021-10-28 DIAGNOSIS — H02402 Unspecified ptosis of left eyelid: Secondary | ICD-10-CM | POA: Diagnosis not present

## 2021-11-04 DIAGNOSIS — H02402 Unspecified ptosis of left eyelid: Secondary | ICD-10-CM | POA: Diagnosis not present

## 2021-12-08 DIAGNOSIS — R011 Cardiac murmur, unspecified: Secondary | ICD-10-CM | POA: Diagnosis not present

## 2021-12-08 DIAGNOSIS — I70219 Atherosclerosis of native arteries of extremities with intermittent claudication, unspecified extremity: Secondary | ICD-10-CM | POA: Diagnosis not present

## 2021-12-08 DIAGNOSIS — I2510 Atherosclerotic heart disease of native coronary artery without angina pectoris: Secondary | ICD-10-CM | POA: Diagnosis not present

## 2021-12-08 DIAGNOSIS — I6523 Occlusion and stenosis of bilateral carotid arteries: Secondary | ICD-10-CM | POA: Diagnosis not present

## 2021-12-08 DIAGNOSIS — J449 Chronic obstructive pulmonary disease, unspecified: Secondary | ICD-10-CM | POA: Diagnosis not present

## 2021-12-08 DIAGNOSIS — Z87891 Personal history of nicotine dependence: Secondary | ICD-10-CM | POA: Diagnosis not present

## 2021-12-08 DIAGNOSIS — R0602 Shortness of breath: Secondary | ICD-10-CM | POA: Diagnosis not present

## 2021-12-08 DIAGNOSIS — I779 Disorder of arteries and arterioles, unspecified: Secondary | ICD-10-CM | POA: Diagnosis not present

## 2021-12-08 DIAGNOSIS — I700 Atherosclerosis of aorta: Secondary | ICD-10-CM | POA: Diagnosis not present

## 2021-12-08 DIAGNOSIS — I10 Essential (primary) hypertension: Secondary | ICD-10-CM | POA: Diagnosis not present

## 2021-12-08 DIAGNOSIS — I739 Peripheral vascular disease, unspecified: Secondary | ICD-10-CM | POA: Diagnosis not present

## 2021-12-08 DIAGNOSIS — I208 Other forms of angina pectoris: Secondary | ICD-10-CM | POA: Diagnosis not present

## 2021-12-14 ENCOUNTER — Other Ambulatory Visit: Payer: Self-pay | Admitting: *Deleted

## 2021-12-14 DIAGNOSIS — D509 Iron deficiency anemia, unspecified: Secondary | ICD-10-CM

## 2021-12-17 NOTE — Progress Notes (Signed)
[place]  Telephone:(336) [phone] Fax:(336) [phone]  ID: Jasmine Acosta OB: [birth year]  MR#: [id number]  ZJI#:[id number]  Patient Care Team: Kirk Ruths, MD as PCP - General (Internal Medicine) Lloyd Huger, MD as Consulting Physician (Oncology)  CHIEF COMPLAINT: Iron deficiency anemia.  INTERVAL HISTORY: Patient returns to clinic today for repeat laboratory work, further evaluation, and consideration of additional IV Feraheme.  She currently feels well and is asymptomatic.  She does not complain of any weakness or fatigue today.  She has no neurologic complaints. She denies any recent fevers or illnesses.  She has a good appetite and denies weight loss.  She has no chest pain, shortness of breath, cough, or hemoptysis.  She denies any nausea, vomiting, constipation, or diarrhea.  She has no melena or hematochezia.  She has no urinary complaints.  Patient offers no specific complaints today.  REVIEW OF SYSTEMS:   Review of Systems  Constitutional: Negative.  Negative for fever, malaise/fatigue and weight loss.  Respiratory: Negative.  Negative for cough, hemoptysis and shortness of breath.   Cardiovascular: Negative.  Negative for chest pain and leg swelling.  Gastrointestinal: Negative.  Negative for abdominal pain, blood in stool and melena.  Genitourinary: Negative.  Negative for hematuria.  Musculoskeletal: Negative.  Negative for back pain.  Skin: Negative.  Negative for rash.  Neurological: Negative.  Negative for dizziness, focal weakness, weakness and headaches.  Psychiatric/Behavioral: Negative.  The patient is not nervous/anxious.    As per HPI. Otherwise, a complete review of systems is negative.  PAST MEDICAL HISTORY: Past Medical History:  Diagnosis Date   Carotid artery occlusion    COPD (chronic obstructive pulmonary disease) ([place]) 07/06/2015   Coronary artery disease    Headache    Hypertension    Hyperthyroidism    ablated with  iodine   Peripheral vascular disease (HCC)    Bilateral Carotid Artery Disease   Personal history of arterial venous malformation (AVM)    Pseudoclaudication     PAST SURGICAL HISTORY: Past Surgical History:  Procedure Laterality Date   ABDOMINAL HYSTERECTOMY     heavy bleeding   CATARACT EXTRACTION W/PHACO Right 10/09/2018   Procedure: CATARACT EXTRACTION PHACO AND INTRAOCULAR LENS PLACEMENT ([place]) RIGHT;  Surgeon: Leandrew Koyanagi, MD;  Location: [place];  Service: Ophthalmology;  Laterality: Right;   CATARACT EXTRACTION W/PHACO Left 01/01/2019   Procedure: CATARACT EXTRACTION PHACO AND INTRAOCULAR LENS PLACEMENT ([place])  LEFT;  Surgeon: Leandrew Koyanagi, MD;  Location: [place];  Service: Ophthalmology;  Laterality: Left;   COLONOSCOPY WITH PROPOFOL N/A 10/30/2019   Procedure: COLONOSCOPY WITH PROPOFOL;  Surgeon: Toledo, Benay Pike, MD;  Location: [facility] ENDOSCOPY;  Service: Gastroenterology;  Laterality: N/A;   CORONARY ANGIOPLASTY     ENDARTERECTOMY Right 06/14/2018   Procedure: ENDARTERECTOMY CAROTID;  Surgeon: Algernon Huxley, MD;  Location: [facility];  Service: Vascular;  Laterality: Right;   ESOPHAGOGASTRODUODENOSCOPY N/A 07/05/2019   Procedure: ESOPHAGOGASTRODUODENOSCOPY (EGD);  Surgeon: Lollie Sails, MD;  [facility];  Service: Endoscopy;  Laterality: N/A;   ESOPHAGOGASTRODUODENOSCOPY (EGD) WITH PROPOFOL N/A 07/08/2019   Procedure: ESOPHAGOGASTRODUODENOSCOPY (EGD) WITH PROPOFOL;  Surgeon: Lollie Sails, MD;  Location: [facility] ENDOSCOPY;  Service: Endoscopy;  Laterality: N/A;   EYE SURGERY     hallux vagus correction Right    HALLUX VALGUS AUSTIN Left 07/01/2016   Procedure: HALLUX VALGUS AUSTIN left foot;  Surgeon: Sharlotte Alamo, DPM;  Location: [facility];  Service: Podiatry;  Laterality: Left;   LOWER EXTREMITY  ANGIOGRAPHY Right 11/05/2018   Procedure: LOWER EXTREMITY ANGIOGRAPHY;  Surgeon: Algernon Huxley, MD;  Location: [place] CV LAB;   Service: Cardiovascular;  Laterality: Right;    FAMILY HISTORY: Family History  Problem Relation Age of Onset   Diabetes Mother    Emphysema Father    Heart disease Sister    Heart disease Brother    Breast cancer Neg Hx    Ovarian cancer Neg Hx    Colon cancer Neg Hx     ADVANCED DIRECTIVES (Y/N):  N  HEALTH MAINTENANCE: Social History   Tobacco Use   Smoking status: Former    Packs/day: 1.00    Years: 50.00    Pack years: 50.00    Types: Cigarettes    Quit date: 06/14/2018    Years since quitting: 3.5   Smokeless tobacco: Never   Tobacco comments:    since age 59  Vaping Use   Vaping Use: Never used  Substance Use Topics   Alcohol use: No   Drug use: No     Colonoscopy:  PAP:  Bone density:  Lipid panel:  Allergies  Allergen Reactions   Lisinopril Other (See Comments)    Other reaction(s): Angioedema Of tongue only   Codeine Nausea And Vomiting    Current Outpatient Medications  Medication Sig Dispense Refill   amLODipine (NORVASC) 5 MG tablet Take 5 mg by mouth daily.   11   aspirin EC 81 MG tablet Take 1 tablet (81 mg total) by mouth daily. 150 tablet 2   clopidogrel (PLAVIX) 75 MG tablet Take 1 tablet (75 mg total) by mouth daily with breakfast. 30 tablet 11   meloxicam (MOBIC) 15 MG tablet Take by mouth.     Multiple Vitamin (MULTIVITAMIN) capsule Take 1 capsule by mouth daily.     potassium chloride (K-DUR,KLOR-CON) 10 MEQ tablet Take 10 mEq by mouth daily.     rosuvastatin (CRESTOR) 40 MG tablet Take 40 mg by mouth daily.     topiramate (TOPAMAX) 50 MG tablet Take 1 tablet by mouth 2 (two) times daily.     No current facility-administered medications for this visit.    OBJECTIVE: Vitals:   12/21/21 1301  BP: 128/61  Pulse: 75  Temp: 98.6 F (37 C)     Body mass index is 25.58 kg/m.    ECOG FS:0 - Asymptomatic  General: Well-developed, well-nourished, no acute distress. Eyes: Pink conjunctiva, anicteric sclera. HEENT: Normocephalic,  moist mucous membranes. Lungs: No audible wheezing or coughing. Heart: Regular rate and rhythm. Abdomen: Soft, nontender, no obvious distention. Musculoskeletal: No edema, cyanosis, or clubbing. Neuro: Alert, answering all questions appropriately. Cranial nerves grossly intact. Skin: No rashes or petechiae noted. Psych: Normal affect.  LAB RESULTS:  Lab Results  Component Value Date   NA 137 06/11/2019   K 3.2 (L) 06/11/2019   CL 105 06/11/2019   CO2 23 06/11/2019   GLUCOSE 110 (H) 06/11/2019   BUN 9 06/11/2019   CREATININE 0.65 06/11/2019   CALCIUM 9.2 06/11/2019   PROT 7.7 06/11/2019   ALBUMIN 4.2 06/11/2019   AST 18 06/11/2019   ALT 17 06/11/2019   ALKPHOS 67 06/11/2019   BILITOT 0.6 06/11/2019   GFRNONAA >60 06/11/2019   GFRAA >60 06/11/2019    Lab Results  Component Value Date   WBC 7.9 12/20/2021   NEUTROABS 5.9 12/20/2021   HGB 9.1 (L) 12/20/2021   HCT 30.5 (L) 12/20/2021   MCV 93.3 12/20/2021   PLT 228 12/20/2021  Lab Results  Component Value Date   IRON 46 12/20/2021   TIBC 273 12/20/2021   IRONPCTSAT 8 (L) 12/20/2021   Lab Results  Component Value Date   FERRITIN 5 (L) 12/20/2021     STUDIES: No results found.  ASSESSMENT: Iron deficiency anemia.  PLAN:    1.  Iron deficiency anemia: Patient's hemoglobin and iron stores have trended down.  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  Prior to that, upper endoscopy on July 08, 2019 that revealed 1 angiectasia in the duodenum that was treated with argon plasma coagulation.  Colonoscopy on October 28, 2019 did not reveal any significant pathology.  Proceed with 510 mg IV Feraheme today.  Return to clinic in 1 week for second infusion.  Patient will then return to clinic in 3 months with repeat laboratory work, further evaluation, and continuation of treatment if needed.    I spent a total of 30 minutes reviewing chart data,  face-to-face evaluation with the patient, counseling and coordination of care as detailed above.  Patient expressed understanding and was in agreement with this plan. She also understands that She can call clinic at any time with any questions, concerns, or complaints.    Lloyd Huger, MD   12/21/2021 1:31 PM

## 2021-12-20 ENCOUNTER — Other Ambulatory Visit: Payer: Self-pay

## 2021-12-20 ENCOUNTER — Inpatient Hospital Stay: Payer: PPO | Attending: Oncology

## 2021-12-20 DIAGNOSIS — Z87891 Personal history of nicotine dependence: Secondary | ICD-10-CM | POA: Diagnosis not present

## 2021-12-20 DIAGNOSIS — Z7982 Long term (current) use of aspirin: Secondary | ICD-10-CM | POA: Diagnosis not present

## 2021-12-20 DIAGNOSIS — Z79899 Other long term (current) drug therapy: Secondary | ICD-10-CM | POA: Insufficient documentation

## 2021-12-20 DIAGNOSIS — D509 Iron deficiency anemia, unspecified: Secondary | ICD-10-CM | POA: Diagnosis not present

## 2021-12-20 LAB — CBC WITH DIFFERENTIAL/PLATELET
Abs Immature Granulocytes: 0.02 10*3/uL (ref 0.00–0.07)
Basophils Absolute: 0 10*3/uL (ref 0.0–0.1)
Basophils Relative: 1 %
Eosinophils Absolute: 0.1 10*3/uL (ref 0.0–0.5)
Eosinophils Relative: 2 %
HCT: 30.5 % — ABNORMAL LOW (ref 36.0–46.0)
Hemoglobin: 9.1 g/dL — ABNORMAL LOW (ref 12.0–15.0)
Immature Granulocytes: 0 %
Lymphocytes Relative: 17 %
Lymphs Abs: 1.3 10*3/uL (ref 0.7–4.0)
MCH: 27.8 pg (ref 26.0–34.0)
MCHC: 29.8 g/dL — ABNORMAL LOW (ref 30.0–36.0)
MCV: 93.3 fL (ref 80.0–100.0)
Monocytes Absolute: 0.5 10*3/uL (ref 0.1–1.0)
Monocytes Relative: 6 %
Neutro Abs: 5.9 10*3/uL (ref 1.7–7.7)
Neutrophils Relative %: 74 %
Platelets: 228 10*3/uL (ref 150–400)
RBC: 3.27 MIL/uL — ABNORMAL LOW (ref 3.87–5.11)
RDW: 13.2 % (ref 11.5–15.5)
WBC: 7.9 10*3/uL (ref 4.0–10.5)
nRBC: 0 % (ref 0.0–0.2)

## 2021-12-20 LAB — IRON AND TIBC
Iron: 46 ug/dL (ref 28–170)
Saturation Ratios: 8 % — ABNORMAL LOW (ref 10.4–31.8)
TIBC: 273 ug/dL (ref 250–450)
UIBC: 252 ug/dL

## 2021-12-20 LAB — FERRITIN: Ferritin: 5 ng/mL — ABNORMAL LOW (ref 11–307)

## 2021-12-21 ENCOUNTER — Inpatient Hospital Stay (HOSPITAL_BASED_OUTPATIENT_CLINIC_OR_DEPARTMENT_OTHER): Payer: PPO | Admitting: Oncology

## 2021-12-21 ENCOUNTER — Inpatient Hospital Stay: Payer: PPO

## 2021-12-21 ENCOUNTER — Encounter: Payer: Self-pay | Admitting: Oncology

## 2021-12-21 VITALS — BP 128/61 | HR 75 | Temp 98.6°F | Wt 131.0 lb

## 2021-12-21 DIAGNOSIS — D509 Iron deficiency anemia, unspecified: Secondary | ICD-10-CM

## 2021-12-21 MED ORDER — SODIUM CHLORIDE 0.9 % IV SOLN
510.0000 mg | Freq: Once | INTRAVENOUS | Status: AC
  Administered 2021-12-21: 510 mg via INTRAVENOUS
  Filled 2021-12-21: qty 510

## 2021-12-21 MED ORDER — SODIUM CHLORIDE 0.9 % IV SOLN
INTRAVENOUS | Status: DC
  Filled 2021-12-21: qty 250

## 2021-12-21 NOTE — Patient Instructions (Signed)

## 2021-12-29 ENCOUNTER — Inpatient Hospital Stay: Payer: PPO

## 2022-01-05 ENCOUNTER — Ambulatory Visit: Payer: PPO

## 2022-01-10 DIAGNOSIS — I779 Disorder of arteries and arterioles, unspecified: Secondary | ICD-10-CM | POA: Diagnosis not present

## 2022-01-10 DIAGNOSIS — I700 Atherosclerosis of aorta: Secondary | ICD-10-CM | POA: Diagnosis not present

## 2022-01-13 ENCOUNTER — Inpatient Hospital Stay: Payer: PPO | Attending: Oncology

## 2022-01-13 ENCOUNTER — Other Ambulatory Visit: Payer: Self-pay

## 2022-01-13 VITALS — BP 136/67 | HR 78 | Temp 97.0°F | Resp 19

## 2022-01-13 DIAGNOSIS — D509 Iron deficiency anemia, unspecified: Secondary | ICD-10-CM | POA: Insufficient documentation

## 2022-01-13 MED ORDER — SODIUM CHLORIDE 0.9 % IV SOLN
510.0000 mg | Freq: Once | INTRAVENOUS | Status: AC
  Administered 2022-01-13: 510 mg via INTRAVENOUS
  Filled 2022-01-13: qty 510

## 2022-01-13 MED ORDER — SODIUM CHLORIDE 0.9 % IV SOLN
Freq: Once | INTRAVENOUS | Status: AC
  Filled 2022-01-13: qty 250

## 2022-01-13 NOTE — Patient Instructions (Signed)

## 2022-01-31 DIAGNOSIS — Z0000 Encounter for general adult medical examination without abnormal findings: Secondary | ICD-10-CM | POA: Diagnosis not present

## 2022-01-31 DIAGNOSIS — I10 Essential (primary) hypertension: Secondary | ICD-10-CM | POA: Diagnosis not present

## 2022-01-31 DIAGNOSIS — R1030 Lower abdominal pain, unspecified: Secondary | ICD-10-CM | POA: Diagnosis not present

## 2022-01-31 DIAGNOSIS — I739 Peripheral vascular disease, unspecified: Secondary | ICD-10-CM | POA: Diagnosis not present

## 2022-01-31 DIAGNOSIS — I700 Atherosclerosis of aorta: Secondary | ICD-10-CM | POA: Diagnosis not present

## 2022-01-31 DIAGNOSIS — I779 Disorder of arteries and arterioles, unspecified: Secondary | ICD-10-CM | POA: Diagnosis not present

## 2022-01-31 DIAGNOSIS — Z87891 Personal history of nicotine dependence: Secondary | ICD-10-CM | POA: Diagnosis not present

## 2022-01-31 DIAGNOSIS — J449 Chronic obstructive pulmonary disease, unspecified: Secondary | ICD-10-CM | POA: Diagnosis not present

## 2022-03-03 DIAGNOSIS — H02402 Unspecified ptosis of left eyelid: Secondary | ICD-10-CM | POA: Diagnosis not present

## 2022-03-18 NOTE — Progress Notes (Unsigned)
[place]  [phone] Fax:([phone]  ID: Hattie Perch OB: 1950-12-01  MR#: [id number]  IRC#:[id number]  Patient Care Team: Kirk Ruths, MD as PCP - General (Internal Medicine) Lloyd Huger, MD as Consulting Physician (Oncology)  CHIEF COMPLAINT: Iron deficiency anemia.  INTERVAL HISTORY: Patient returns to clinic today for repeat laboratory work, further evaluation, and consideration of additional IV Feraheme.  She currently feels well and is asymptomatic.  She does not complain of any weakness or fatigue today.  She has no neurologic complaints. She denies any recent fevers or illnesses.  She has a good appetite and denies weight loss.  She has no chest pain, shortness of breath, cough, or hemoptysis.  She denies any nausea, vomiting, constipation, or diarrhea.  She has no melena or hematochezia.  She has no urinary complaints.  Patient offers no specific complaints today.  REVIEW OF SYSTEMS:   Review of Systems  Constitutional: Negative.  Negative for fever, malaise/fatigue and weight loss.  Respiratory: Negative.  Negative for cough, hemoptysis and shortness of breath.   Cardiovascular: Negative.  Negative for chest pain and leg swelling.  Gastrointestinal: Negative.  Negative for abdominal pain, blood in stool and melena.  Genitourinary: Negative.  Negative for hematuria.  Musculoskeletal: Negative.  Negative for back pain.  Skin: Negative.  Negative for rash.  Neurological: Negative.  Negative for dizziness, focal weakness, weakness and headaches.  Psychiatric/Behavioral: Negative.  The patient is not nervous/anxious.    As per HPI. Otherwise, a complete review of systems is negative.  PAST MEDICAL HISTORY: Past Medical History:  Diagnosis Date   Carotid artery occlusion    COPD (chronic obstructive pulmonary disease) ([place]) 07/06/2015   Coronary artery disease    Headache    Hypertension    Hyperthyroidism    ablated with  iodine   Peripheral vascular disease (HCC)    Bilateral Carotid Artery Disease   Personal history of arterial venous malformation (AVM)    Pseudoclaudication     PAST SURGICAL HISTORY: Past Surgical History:  Procedure Laterality Date   ABDOMINAL HYSTERECTOMY     heavy bleeding   CATARACT EXTRACTION W/PHACO Right 10/09/2018   Procedure: CATARACT EXTRACTION PHACO AND INTRAOCULAR LENS PLACEMENT ([place]) RIGHT;  Surgeon: Leandrew Koyanagi, MD;  Location: [place];  Service: Ophthalmology;  Laterality: Right;   CATARACT EXTRACTION W/PHACO Left 01/01/2019   Procedure: CATARACT EXTRACTION PHACO AND INTRAOCULAR LENS PLACEMENT ([place])  LEFT;  Surgeon: Leandrew Koyanagi, MD;  Location: [place];  Service: Ophthalmology;  Laterality: Left;   COLONOSCOPY WITH PROPOFOL N/A 10/30/2019   Procedure: COLONOSCOPY WITH PROPOFOL;  Surgeon: Toledo, Benay Pike, MD;  Location: [facility]OSCOPY;  Service: Gastroenterology;  Laterality: N/A;   CORONARY ANGIOPLASTY     ENDARTERECTOMY Right 06/14/2018   Procedure: ENDARTERECTOMY CAROTID;  Surgeon: Algernon Huxley, MD;  Location: [facility];  Service: Vascular;  Laterality: Right;   ESOPHAGOGASTRODUODENOSCOPY N/A 07/05/2019   Procedure: ESOPHAGOGASTRODUODENOSCOPY (EGD);  Surgeon: Lollie Sails, MD;  [facility] END[facility]COPY;  Service: Endoscopy;  Laterality: N/A;   ESOPHAGOGASTRODUODENOSCOPY (EGD) WITH PROPOFOL N/A 07/08/2019   Procedure: ESOPHAGOGASTRODUODENOSCOPY (EGD) WITH PROPOFOL;  Surgeon: Lollie Sails, MD;  Location: [facility] ENDOSCOPY;  Service: Endoscopy;  Laterality: N/A;   EYE SURGERY     hallux vagus correction Right    HALLUX VALGUS AUSTIN Left 07/01/2016   Procedure: HALLUX VALGUS AUSTIN left foot;  Surgeon: Sharlotte Alamo, DPM;  Location: [facility];  Service: Podiatry;  Laterality: Left;   LOWER EXTREMITY  ANGIOGRAPHY Right 11/05/2018   Procedure: LOWER EXTREMITY ANGIOGRAPHY;  Surgeon: Algernon Huxley, MD;  Location: [place] CV LAB;   Service: Cardiovascular;  Laterality: Right;    FAMILY HISTORY: Family History  Problem Relation Age of Onset   Diabetes Mother    Emphysema Father    Heart disease Sister    Heart disease Brother    Breast cancer Neg Hx    Ovarian cancer Neg Hx    Colon cancer Neg Hx     ADVANCED DIRECTIVES (Y/N):  N  HEALTH MAINTENANCE: Social History   Tobacco Use   Smoking status: Former    Packs/day: 1.00    Years: 50.00    Pack years: 50.00    Types: Cigarettes    Quit date: 06/14/2018    Years since quitting: 3.7   Smokeless tobacco: Never   Tobacco comments:    since age 60  Vaping Use   Vaping Use: Never used  Substance Use Topics   Alcohol use: No   Drug use: No     Colonoscopy:  PAP:  Bone density:  Lipid panel:  Allergies  Allergen Reactions   Lisinopril Other (See Comments)    Other reaction(s): Angioedema Of tongue only   Codeine Nausea And Vomiting    Current Outpatient Medications  Medication Sig Dispense Refill   amLODipine (NORVASC) 5 MG tablet Take 5 mg by mouth daily.   11   aspirin EC 81 MG tablet Take 1 tablet (81 mg total) by mouth daily. 150 tablet 2   clopidogrel (PLAVIX) 75 MG tablet Take 1 tablet (75 mg total) by mouth daily with breakfast. 30 tablet 11   meloxicam (MOBIC) 15 MG tablet Take by mouth.     Multiple Vitamin (MULTIVITAMIN) capsule Take 1 capsule by mouth daily.     potassium chloride (K-DUR,KLOR-CON) 10 MEQ tablet Take 10 mEq by mouth daily.     rosuvastatin (CRESTOR) 40 MG tablet Take 40 mg by mouth daily.     No current facility-administered medications for this visit.    OBJECTIVE: There were no vitals filed for this visit.    There is no height or weight on file to calculate BMI.    ECOG FS:0 - Asymptomatic  General: Well-developed, well-nourished, no acute distress. Eyes: Pink conjunctiva, anicteric sclera. HEENT: Normocephalic, moist mucous membranes. Lungs: No audible wheezing or coughing. Heart: Regular rate and  rhythm. Abdomen: Soft, nontender, no obvious distention. Musculoskeletal: No edema, cyanosis, or clubbing. Neuro: Alert, answering all questions appropriately. Cranial nerves grossly intact. Skin: No rashes or petechiae noted. Psych: Normal affect.  LAB RESULTS:  Lab Results  Component Value Date   NA 137 06/11/2019   K 3.2 (L) 06/11/2019   CL 105 06/11/2019   CO2 23 06/11/2019   GLUCOSE 110 (H) 06/11/2019   BUN 9 06/11/2019   CREATININE 0.65 06/11/2019   CALCIUM 9.2 06/11/2019   PROT 7.7 06/11/2019   ALBUMIN 4.2 06/11/2019   AST 18 06/11/2019   ALT 17 06/11/2019   ALKPHOS 67 06/11/2019   BILITOT 0.6 06/11/2019   GFRNONAA >60 06/11/2019   GFRAA >60 06/11/2019    Lab Results  Component Value Date   WBC 7.9 12/20/2021   NEUTROABS 5.9 12/20/2021   HGB 9.1 (L) 12/20/2021   HCT 30.5 (L) 12/20/2021   MCV 93.3 12/20/2021   PLT 228 12/20/2021   Lab Results  Component Value Date   IRON 46 12/20/2021   TIBC 273 12/20/2021   IRONPCTSAT 8 (L) 12/20/2021  Lab Results  Component Value Date   FERRITIN 5 (L) 12/20/2021     STUDIES: No results found.  ASSESSMENT: Iron deficiency anemia.  PLAN:    1.  Iron deficiency anemia: Patient's hemoglobin and iron stores have trended down.  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  Prior to that, upper endoscopy on July 08, 2019 that revealed 1 angiectasia in the duodenum that was treated with argon plasma coagulation.  Colonoscopy on October 28, 2019 did not reveal any significant pathology.  Proceed with 510 mg IV Feraheme today.  Return to clinic in 1 week for second infusion.  Patient will then return to clinic in 3 months with repeat laboratory work, further evaluation, and continuation of treatment if needed.    I spent a total of 30 minutes reviewing chart data, face-to-face evaluation with the patient, counseling and coordination of care as detailed  above.  Patient expressed understanding and was in agreement with this plan. She also understands that She can call clinic at any time with any questions, concerns, or complaints.    Lloyd Huger, MD   03/18/2022 10:45 AM

## 2022-03-22 ENCOUNTER — Other Ambulatory Visit: Payer: PPO

## 2022-03-23 ENCOUNTER — Inpatient Hospital Stay: Payer: PPO | Attending: Oncology

## 2022-03-23 DIAGNOSIS — D509 Iron deficiency anemia, unspecified: Secondary | ICD-10-CM | POA: Insufficient documentation

## 2022-03-23 LAB — CBC WITH DIFFERENTIAL/PLATELET
Abs Immature Granulocytes: 0.02 10*3/uL (ref 0.00–0.07)
Basophils Absolute: 0 10*3/uL (ref 0.0–0.1)
Basophils Relative: 1 %
Eosinophils Absolute: 0.1 10*3/uL (ref 0.0–0.5)
Eosinophils Relative: 2 %
HCT: 34.9 % — ABNORMAL LOW (ref 36.0–46.0)
Hemoglobin: 10.7 g/dL — ABNORMAL LOW (ref 12.0–15.0)
Immature Granulocytes: 0 %
Lymphocytes Relative: 22 %
Lymphs Abs: 1.5 10*3/uL (ref 0.7–4.0)
MCH: 29.4 pg (ref 26.0–34.0)
MCHC: 30.7 g/dL (ref 30.0–36.0)
MCV: 95.9 fL (ref 80.0–100.0)
Monocytes Absolute: 0.6 10*3/uL (ref 0.1–1.0)
Monocytes Relative: 9 %
Neutro Abs: 4.7 10*3/uL (ref 1.7–7.7)
Neutrophils Relative %: 66 %
Platelets: 251 10*3/uL (ref 150–400)
RBC: 3.64 MIL/uL — ABNORMAL LOW (ref 3.87–5.11)
RDW: 13.2 % (ref 11.5–15.5)
WBC: 7 10*3/uL (ref 4.0–10.5)
nRBC: 0 % (ref 0.0–0.2)

## 2022-03-23 LAB — IRON AND TIBC
Iron: 43 ug/dL (ref 28–170)
Saturation Ratios: 12 % (ref 10.4–31.8)
TIBC: 375 ug/dL (ref 250–450)
UIBC: 332 ug/dL

## 2022-03-23 LAB — FERRITIN: Ferritin: 9 ng/mL — ABNORMAL LOW (ref 11–307)

## 2022-03-24 ENCOUNTER — Inpatient Hospital Stay: Payer: PPO

## 2022-03-24 ENCOUNTER — Encounter: Payer: Self-pay | Admitting: Oncology

## 2022-03-24 ENCOUNTER — Inpatient Hospital Stay: Payer: PPO | Attending: Oncology | Admitting: Oncology

## 2022-03-24 VITALS — BP 132/71 | HR 71 | Temp 97.5°F | Resp 16 | Ht 60.0 in | Wt 129.4 lb

## 2022-03-24 VITALS — BP 119/62 | HR 74

## 2022-03-24 DIAGNOSIS — D509 Iron deficiency anemia, unspecified: Secondary | ICD-10-CM | POA: Diagnosis not present

## 2022-03-24 MED ORDER — SODIUM CHLORIDE 0.9 % IV SOLN
INTRAVENOUS | Status: DC
  Filled 2022-03-24: qty 250

## 2022-03-24 MED ORDER — SODIUM CHLORIDE 0.9 % IV SOLN
510.0000 mg | Freq: Once | INTRAVENOUS | Status: AC
  Administered 2022-03-24: 510 mg via INTRAVENOUS
  Filled 2022-03-24: qty 510

## 2022-03-24 NOTE — Progress Notes (Signed)
Patient tolerated Feraheme infusion well, no questions/concerns voiced. Patient stable at discharge. Refused post monitoring. AVS given.

## 2022-03-24 NOTE — Patient Instructions (Signed)

## 2022-03-31 DIAGNOSIS — R1032 Left lower quadrant pain: Secondary | ICD-10-CM | POA: Diagnosis not present

## 2022-03-31 DIAGNOSIS — N898 Other specified noninflammatory disorders of vagina: Secondary | ICD-10-CM | POA: Diagnosis not present

## 2022-04-20 DIAGNOSIS — N898 Other specified noninflammatory disorders of vagina: Secondary | ICD-10-CM | POA: Diagnosis not present

## 2022-04-20 DIAGNOSIS — N828 Other female genital tract fistulae: Secondary | ICD-10-CM | POA: Diagnosis not present

## 2022-04-21 ENCOUNTER — Other Ambulatory Visit: Payer: Self-pay | Admitting: Obstetrics and Gynecology

## 2022-04-21 DIAGNOSIS — N828 Other female genital tract fistulae: Secondary | ICD-10-CM

## 2022-04-28 ENCOUNTER — Ambulatory Visit: Admission: RE | Admit: 2022-04-28 | Payer: PPO | Source: Ambulatory Visit

## 2022-05-06 DIAGNOSIS — M19041 Primary osteoarthritis, right hand: Secondary | ICD-10-CM | POA: Diagnosis not present

## 2022-05-18 ENCOUNTER — Ambulatory Visit
Admission: RE | Admit: 2022-05-18 | Discharge: 2022-05-18 | Disposition: A | Discharge status: Home / Self Care | Payer: PPO | Source: Ambulatory Visit | Attending: Obstetrics and Gynecology | Admitting: Obstetrics and Gynecology

## 2022-05-18 DIAGNOSIS — N828 Other female genital tract fistulae: Secondary | ICD-10-CM | POA: Diagnosis not present

## 2022-05-18 DIAGNOSIS — R102 Pelvic and perineal pain: Secondary | ICD-10-CM | POA: Diagnosis not present

## 2022-05-18 DIAGNOSIS — N281 Cyst of kidney, acquired: Secondary | ICD-10-CM | POA: Diagnosis not present

## 2022-05-18 LAB — POCT I-STAT CREATININE: Creatinine, Ser: 0.9 mg/dL (ref 0.44–1.00)

## 2022-05-18 MED ORDER — IOHEXOL 300 MG/ML  SOLN
80.0000 mL | Freq: Once | INTRAMUSCULAR | Status: AC | PRN
  Administered 2022-05-18: 80 mL via INTRAVENOUS

## 2022-05-26 DIAGNOSIS — Z961 Presence of intraocular lens: Secondary | ICD-10-CM | POA: Diagnosis not present

## 2022-06-06 DIAGNOSIS — M79644 Pain in right finger(s): Secondary | ICD-10-CM | POA: Diagnosis not present

## 2022-06-06 DIAGNOSIS — M13841 Other specified arthritis, right hand: Secondary | ICD-10-CM | POA: Diagnosis not present

## 2022-06-21 ENCOUNTER — Other Ambulatory Visit: Payer: Self-pay

## 2022-06-21 DIAGNOSIS — Z87891 Personal history of nicotine dependence: Secondary | ICD-10-CM

## 2022-06-21 DIAGNOSIS — Z122 Encounter for screening for malignant neoplasm of respiratory organs: Secondary | ICD-10-CM

## 2022-07-06 ENCOUNTER — Ambulatory Visit: Admission: RE | Admit: 2022-07-06 | Payer: PPO | Source: Ambulatory Visit

## 2022-07-13 ENCOUNTER — Ambulatory Visit
Admission: RE | Admit: 2022-07-13 | Discharge: 2022-07-13 | Disposition: A | Discharge status: Home / Self Care | Payer: PPO | Source: Ambulatory Visit | Attending: Acute Care | Admitting: Acute Care

## 2022-07-13 DIAGNOSIS — Z122 Encounter for screening for malignant neoplasm of respiratory organs: Secondary | ICD-10-CM | POA: Insufficient documentation

## 2022-07-13 DIAGNOSIS — Z87891 Personal history of nicotine dependence: Secondary | ICD-10-CM | POA: Insufficient documentation

## 2022-07-14 ENCOUNTER — Other Ambulatory Visit: Payer: Self-pay

## 2022-07-14 DIAGNOSIS — Z87891 Personal history of nicotine dependence: Secondary | ICD-10-CM

## 2022-07-14 DIAGNOSIS — Z122 Encounter for screening for malignant neoplasm of respiratory organs: Secondary | ICD-10-CM

## 2022-07-26 DIAGNOSIS — I779 Disorder of arteries and arterioles, unspecified: Secondary | ICD-10-CM | POA: Diagnosis not present

## 2022-07-26 DIAGNOSIS — I700 Atherosclerosis of aorta: Secondary | ICD-10-CM | POA: Diagnosis not present

## 2022-07-26 DIAGNOSIS — I10 Essential (primary) hypertension: Secondary | ICD-10-CM | POA: Diagnosis not present

## 2022-07-27 ENCOUNTER — Inpatient Hospital Stay: Payer: PPO | Attending: Nurse Practitioner

## 2022-07-27 DIAGNOSIS — D509 Iron deficiency anemia, unspecified: Secondary | ICD-10-CM | POA: Insufficient documentation

## 2022-07-27 LAB — IRON AND TIBC
Iron: 22 ug/dL — ABNORMAL LOW (ref 28–170)
Saturation Ratios: 5 % — ABNORMAL LOW (ref 10.4–31.8)
TIBC: 419 ug/dL (ref 250–450)
UIBC: 397 ug/dL

## 2022-07-28 ENCOUNTER — Inpatient Hospital Stay (HOSPITAL_BASED_OUTPATIENT_CLINIC_OR_DEPARTMENT_OTHER): Payer: PPO | Admitting: Hospice and Palliative Medicine

## 2022-07-28 ENCOUNTER — Inpatient Hospital Stay: Payer: PPO

## 2022-07-28 ENCOUNTER — Other Ambulatory Visit: Payer: Self-pay

## 2022-07-28 VITALS — BP 151/67 | HR 79 | Temp 96.5°F | Resp 18 | Ht 60.0 in | Wt 128.9 lb

## 2022-07-28 DIAGNOSIS — D509 Iron deficiency anemia, unspecified: Secondary | ICD-10-CM

## 2022-07-28 LAB — CBC WITH DIFFERENTIAL/PLATELET
Abs Immature Granulocytes: 0.02 10*3/uL (ref 0.00–0.07)
Basophils Absolute: 0.1 10*3/uL (ref 0.0–0.1)
Basophils Relative: 1 %
Eosinophils Absolute: 0.2 10*3/uL (ref 0.0–0.5)
Eosinophils Relative: 3 %
HCT: 31.9 % — ABNORMAL LOW (ref 36.0–46.0)
Hemoglobin: 9.3 g/dL — ABNORMAL LOW (ref 12.0–15.0)
Immature Granulocytes: 0 %
Lymphocytes Relative: 30 %
Lymphs Abs: 2 10*3/uL (ref 0.7–4.0)
MCH: 23 pg — ABNORMAL LOW (ref 26.0–34.0)
MCHC: 29.2 g/dL — ABNORMAL LOW (ref 30.0–36.0)
MCV: 79 fL — ABNORMAL LOW (ref 80.0–100.0)
Monocytes Absolute: 0.6 10*3/uL (ref 0.1–1.0)
Monocytes Relative: 8 %
Neutro Abs: 3.8 10*3/uL (ref 1.7–7.7)
Neutrophils Relative %: 58 %
Platelets: 238 10*3/uL (ref 150–400)
RBC: 4.04 MIL/uL (ref 3.87–5.11)
RDW: 16.1 % — ABNORMAL HIGH (ref 11.5–15.5)
WBC: 6.6 10*3/uL (ref 4.0–10.5)
nRBC: 0 % (ref 0.0–0.2)

## 2022-07-28 LAB — FERRITIN: Ferritin: 6 ng/mL — ABNORMAL LOW (ref 11–307)

## 2022-07-28 MED ORDER — SODIUM CHLORIDE 0.9 % IV SOLN
510.0000 mg | Freq: Once | INTRAVENOUS | Status: AC
  Administered 2022-07-28: 510 mg via INTRAVENOUS
  Filled 2022-07-28: qty 510

## 2022-07-28 MED ORDER — SODIUM CHLORIDE 0.9 % IV SOLN
Freq: Once | INTRAVENOUS | Status: AC
  Filled 2022-07-28: qty 250

## 2022-07-28 NOTE — Progress Notes (Signed)
[place]  Telephone:(336) [phone] Fax:(336) [phone]  ID: Jasmine Acosta OB: Jun 08, 1951  MR#: [id number]  OAC#:[id number]  Patient Care Team: Kirk Ruths, MD as PCP - General (Internal Medicine) Lloyd Huger, MD as Consulting Physician (Oncology)  CHIEF COMPLAINT: Iron deficiency anemia.  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  Prior to that, upper endoscopy on July 08, 2019 that revealed 1 angiectasia in the duodenum that was treated with argon plasma coagulation.  Colonoscopy on October 28, 2019 did not reveal any significant pathology.  Patient has required multiple doses of Feraheme since May 2021.  INTERVAL HISTORY: Patient returns to clinic today for repeat laboratory work, further evaluation, and consideration of IV Feraheme.    Patient reports that she feels well.  She denies any significant changes or concerns.  No symptomatic complaints at present other than recent "sinus infection."  She denies fatigue or shortness of breath.  No chest pain.  She denies dark or tarry stools.  No overt bleeding or melena.  REVIEW OF SYSTEMS:   Review of Systems  Constitutional: Negative.  Negative for fever, malaise/fatigue and weight loss.  Respiratory: Negative.  Negative for cough, hemoptysis and shortness of breath.   Cardiovascular: Negative.  Negative for chest pain and leg swelling.  Gastrointestinal: Negative.  Negative for abdominal pain, blood in stool and melena.  Genitourinary: Negative.  Negative for hematuria.  Musculoskeletal: Negative.  Negative for back pain.  Skin: Negative.  Negative for rash.  Neurological: Negative.  Negative for dizziness, focal weakness, weakness and headaches.  Psychiatric/Behavioral: Negative.  The patient is not nervous/anxious.     As per HPI. Otherwise, a complete review of systems is negative.  PAST MEDICAL HISTORY: Past Medical  History:  Diagnosis Date   Carotid artery occlusion    COPD (chronic obstructive pulmonary disease) ([place]) 07/06/2015   Coronary artery disease    Headache    Hypertension    Hyperthyroidism    ablated with iodine   Peripheral vascular disease (HCC)    Bilateral Carotid Artery Disease   Personal history of arterial venous malformation (AVM)    Pseudoclaudication     PAST SURGICAL HISTORY: Past Surgical History:  Procedure Laterality Date   ABDOMINAL HYSTERECTOMY     heavy bleeding   CATARACT EXTRACTION W/PHACO Right 10/09/2018   Procedure: CATARACT EXTRACTION PHACO AND INTRAOCULAR LENS PLACEMENT ([place]) RIGHT;  Surgeon: Leandrew Koyanagi, MD;  Location: [place];  Service: Ophthalmology;  Laterality: Right;   CATARACT EXTRACTION W/PHACO Left 01/01/2019   Procedure: CATARACT EXTRACTION PHACO AND INTRAOCULAR LENS PLACEMENT ([place])  LEFT;  Surgeon: Leandrew Koyanagi, MD;  Location: [place];  Service: Ophthalmology;  Laterality: Left;   COLONOSCOPY WITH PROPOFOL N/A 10/30/2019   Procedure: COLONOSCOPY WITH PROPOFOL;  Surgeon: Toledo, Benay Pike, MD;  Location: [facility] ENDOSCOPY;  Service: Gastroenterology;  Laterality: N/A;   CORONARY ANGIOPLASTY     ENDARTERECTOMY Right 06/14/2018   Procedure: ENDARTERECTOMY CAROTID;  Surgeon: Algernon Huxley, MD;  Location: [facility];  Service: Vascular;  Laterality: Right;   ESOPHAGOGASTRODUODENOSCOPY N/A 07/05/2019   Procedure: ESOPHAGOGASTRODUODENOSCOPY (EGD);  Surgeon: Lollie Sails, MD;  Location: [facility] ENDOSCOPY;  Service: Endoscopy;  Laterality: N/A;   ESOPHAGOGASTRODUODENOSCOPY (EGD) WITH PROPOFOL N/A 07/08/2019   Procedure: ESOPHAGOGASTRODUODENOSCOPY (EGD) WITH PROPOFOL;  Surgeon: Lollie Sails, MD;  Location: [facility] ENDOSCOPY;  Service: Endoscopy;  Laterality: N/A;   EYE SURGERY     hallux vagus correction  Right    HALLUX VALGUS AUSTIN Left 07/01/2016   Procedure: HALLUX VALGUS AUSTIN left foot;  Surgeon: Sharlotte Alamo, DPM;   Location: [facility];  Service: Podiatry;  Laterality: Left;   LOWER EXTREMITY ANGIOGRAPHY Right 11/05/2018   Procedure: LOWER EXTREMITY ANGIOGRAPHY;  Surgeon: Algernon Huxley, MD;  Location: [place] CV LAB;  Service: Cardiovascular;  Laterality: Right;    FAMILY HISTORY: Family History  Problem Relation Age of Onset   Diabetes Mother    Emphysema Father    Heart disease Sister    Heart disease Brother    Breast cancer Neg Hx    Ovarian cancer Neg Hx    Colon cancer Neg Hx     ADVANCED DIRECTIVES (Y/N):  N  HEALTH MAINTENANCE: Social History   Tobacco Use   Smoking status: Former    Packs/day: 1.00    Years: 50.00    Total pack years: 50.00    Types: Cigarettes    Quit date: 06/14/2018    Years since quitting: 4.1   Smokeless tobacco: Never   Tobacco comments:    since age 64  Vaping Use   Vaping Use: Never used  Substance Use Topics   Alcohol use: No   Drug use: No     Colonoscopy:  PAP:  Bone density:  Lipid panel:  Allergies  Allergen Reactions   Lisinopril Other (See Comments)    Other reaction(s): Angioedema Of tongue only   Codeine Nausea And Vomiting    Current Outpatient Medications  Medication Sig Dispense Refill   amLODipine (NORVASC) 5 MG tablet Take 5 mg by mouth daily.   11   aspirin EC 81 MG tablet Take 1 tablet (81 mg total) by mouth daily. 150 tablet 2   clopidogrel (PLAVIX) 75 MG tablet Take 1 tablet (75 mg total) by mouth daily with breakfast. 30 tablet 11   meloxicam (MOBIC) 15 MG tablet Take by mouth.     Multiple Vitamin (MULTIVITAMIN) capsule Take 1 capsule by mouth daily.     potassium chloride (K-DUR,KLOR-CON) 10 MEQ tablet Take 10 mEq by mouth daily.     rosuvastatin (CRESTOR) 40 MG tablet Take 40 mg by mouth daily.     No current facility-administered medications for this visit.    OBJECTIVE: There were no vitals filed for this visit.    There is no height or weight on file to calculate BMI.    ECOG FS:0 -  Asymptomatic  General: Well-developed, well-nourished, no acute distress. Eyes: Pink conjunctiva, anicteric sclera. HEENT: Normocephalic, moist mucous membranes. Lungs: No audible wheezing or coughing. Heart: Regular rate and rhythm. Abdomen: Soft, nontender, no obvious distention. Musculoskeletal: No edema, cyanosis, or clubbing. Neuro: Alert, answering all questions appropriately. Cranial nerves grossly intact. Skin: No rashes or petechiae noted. Psych: Normal affect.  LAB RESULTS:  Lab Results  Component Value Date   NA 137 06/11/2019   K 3.2 (L) 06/11/2019   CL 105 06/11/2019   CO2 23 06/11/2019   GLUCOSE 110 (H) 06/11/2019   BUN 9 06/11/2019   CREATININE 0.90 05/18/2022   CALCIUM 9.2 06/11/2019   PROT 7.7 06/11/2019   ALBUMIN 4.2 06/11/2019   AST 18 06/11/2019   ALT 17 06/11/2019   ALKPHOS 67 06/11/2019   BILITOT 0.6 06/11/2019   GFRNONAA >60 06/11/2019   GFRAA >60 06/11/2019    Lab Results  Component Value Date   WBC 7.0 03/23/2022   NEUTROABS 4.7 03/23/2022   HGB 10.7 (L) 03/23/2022   HCT  34.9 (L) 03/23/2022   MCV 95.9 03/23/2022   PLT 251 03/23/2022   Lab Results  Component Value Date   IRON 22 (L) 07/27/2022   TIBC 419 07/27/2022   IRONPCTSAT 5 (L) 07/27/2022   Lab Results  Component Value Date   FERRITIN 9 (L) 03/23/2022     STUDIES: CT CHEST LUNG CA SCREEN LOW DOSE W/O CM  Result Date: 07/14/2022 CLINICAL DATA:  71 year old female with 50 pack-year history of smoking. Lung cancer screening. EXAM: CT CHEST WITHOUT CONTRAST LOW-DOSE FOR LUNG CANCER SCREENING TECHNIQUE: Multidetector CT imaging of the chest was performed following the standard protocol without IV contrast. RADIATION DOSE REDUCTION: This exam was performed according to the departmental dose-optimization program which includes automated exposure control, adjustment of the mA and/or kV according to patient size and/or use of iterative reconstruction technique. COMPARISON:  05/20/2021  FINDINGS: Cardiovascular: The heart size is normal. No substantial pericardial effusion. Coronary artery calcification is evident. Mild atherosclerotic calcification is noted in the wall of the thoracic aorta. Mediastinum/Nodes: No mediastinal lymphadenopathy. No evidence for gross hilar lymphadenopathy although assessment is limited by the lack of intravenous contrast on the current study. The esophagus has normal imaging features. There is no axillary lymphadenopathy. Lungs/Pleura: Centrilobular and paraseptal emphysema evident. Biapical pleuroparenchymal scarring again noted. Previously identified tiny bilateral pulmonary nodules are stable in the interval. No new suspicious pulmonary nodule or mass. No focal airspace consolidation. No pleural effusion. Upper Abdomen: Unremarkable. Musculoskeletal: No worrisome lytic or sclerotic osseous abnormality. IMPRESSION: 1. Lung-RADS 2, benign appearance or behavior. Continue annual screening with low-dose chest CT without contrast in 12 months. 2.  Emphysema (ICD10-J43.9) and Aortic Atherosclerosis (ICD10-170.0) Electronically Signed   By: Misty Stanley M.D.   On: 07/14/2022 06:57   ASSESSMENT: Iron deficiency anemia.  PLAN:    Iron deficiency anemia: Iron 22 today, down from 43 to 4 months ago.  Hemoglobin dropped slightly from 10.7-9.3.  Proceed with Feraheme today and repeat dose in 1 week.  Will refer back to GI for follow-up (last seen in 2021).  Return to clinic in 3 months for repeat labs/APP  Case and plan discussed with Dr. Grayland Ormond   I spent a total of 15 minutes reviewing chart data, face-to-face evaluation with the patient, counseling and coordination of care as detailed above.  Patient expressed understanding and was in agreement with this plan. She also understands that She can call clinic at any time with any questions, concerns, or complaints.    Irean Hong, NP   07/28/2022 1:35 PM

## 2022-08-02 ENCOUNTER — Telehealth: Payer: Self-pay | Admitting: *Deleted

## 2022-08-02 DIAGNOSIS — Z23 Encounter for immunization: Secondary | ICD-10-CM | POA: Diagnosis not present

## 2022-08-02 DIAGNOSIS — I739 Peripheral vascular disease, unspecified: Secondary | ICD-10-CM | POA: Diagnosis not present

## 2022-08-02 DIAGNOSIS — I10 Essential (primary) hypertension: Secondary | ICD-10-CM | POA: Diagnosis not present

## 2022-08-02 DIAGNOSIS — I700 Atherosclerosis of aorta: Secondary | ICD-10-CM | POA: Diagnosis not present

## 2022-08-02 DIAGNOSIS — I779 Disorder of arteries and arterioles, unspecified: Secondary | ICD-10-CM | POA: Diagnosis not present

## 2022-08-02 DIAGNOSIS — J449 Chronic obstructive pulmonary disease, unspecified: Secondary | ICD-10-CM | POA: Diagnosis not present

## 2022-08-02 NOTE — Telephone Encounter (Signed)
GI referral to Dr. Alice Reichert- faxed to [facility]

## 2022-08-04 ENCOUNTER — Inpatient Hospital Stay: Payer: PPO

## 2022-08-04 VITALS — BP 137/65 | HR 87 | Temp 97.7°F | Resp 18

## 2022-08-04 DIAGNOSIS — D509 Iron deficiency anemia, unspecified: Secondary | ICD-10-CM

## 2022-08-04 MED ORDER — SODIUM CHLORIDE 0.9 % IV SOLN
510.0000 mg | Freq: Once | INTRAVENOUS | Status: AC
  Administered 2022-08-04: 510 mg via INTRAVENOUS
  Filled 2022-08-04: qty 510

## 2022-08-04 MED ORDER — SODIUM CHLORIDE 0.9 % IV SOLN
INTRAVENOUS | Status: DC
  Filled 2022-08-04: qty 250

## 2022-08-04 NOTE — Patient Instructions (Signed)
[facility]  Discharge Instructions: Thank you for choosing [facility] to provide your oncology and hematology care.  If you have a lab appointment with the Cancer Center, please go directly to the Cancer Center and check in at the registration area.  Wear comfortable clothing and clothing appropriate for easy access to any Portacath or PICC line.   We strive to give you quality time with your provider. You may need to reschedule your appointment if you arrive late (15 or more minutes).  Arriving late affects you and other patients whose appointments are after yours.  Also, if you miss three or more appointments without notifying the office, you may be dismissed from the clinic at the provider's discretion.      For prescription refill requests, have your pharmacy contact our office and allow 72 hours for refills to be completed.    Today you received the following chemotherapy and/or immunotherapy agents Feraheme.      To help prevent nausea and vomiting after your treatment, we encourage you to take your nausea medication as directed.  BELOW ARE SYMPTOMS THAT SHOULD BE REPORTED IMMEDIATELY: *FEVER GREATER THAN 100.4 F (38 C) OR HIGHER *CHILLS OR SWEATING *NAUSEA AND VOMITING THAT IS NOT CONTROLLED WITH YOUR NAUSEA MEDICATION *UNUSUAL SHORTNESS OF BREATH *UNUSUAL BRUISING OR BLEEDING *URINARY PROBLEMS (pain or burning when urinating, or frequent urination) *BOWEL PROBLEMS (unusual diarrhea, constipation, pain near the anus) TENDERNESS IN MOUTH AND THROAT WITH OR WITHOUT PRESENCE OF ULCERS (sore throat, sores in mouth, or a toothache) UNUSUAL RASH, SWELLING OR PAIN  UNUSUAL VAGINAL DISCHARGE OR ITCHING   Items with * indicate a potential emergency and should be followed up as soon as possible or go to the Emergency Department if any problems should occur.  Please show the CHEMOTHERAPY ALERT CARD or IMMUNOTHERAPY ALERT CARD at check-in to  the Emergency Department and triage nurse.  Should you have questions after your visit or need to cancel or reschedule your appointment, please contact [facility]  [phone] and follow the prompts.  Office hours are 8:00 a.m. to 4:30 p.m. Monday - Friday. Please note that voicemails left after 4:00 p.m. may not be returned until the following business day.  We are closed weekends and major holidays. You have access to a nurse at all times for urgent questions. Please call the main number to the clinic [phone] and follow the prompts.  For any non-urgent questions, you may also contact your provider using MyChart. We now offer e-Visits for anyone 18 and older to request care online for non-urgent symptoms. For details visit [email or web address].   Also download the MyChart app! Go to the app store, search "MyChart", open the app, select [facility], and log in with your MyChart username and password.  Masks are optional in the cancer centers. If you would like for your care team to wear a mask while they are taking care of you, please let them know. For doctor visits, patients may have with them one support person who is at least 72 years old. At this time, visitors are not allowed in the infusion area.   

## 2022-08-09 DIAGNOSIS — Z23 Encounter for immunization: Secondary | ICD-10-CM | POA: Diagnosis not present

## 2022-08-22 ENCOUNTER — Encounter (INDEPENDENT_AMBULATORY_CARE_PROVIDER_SITE_OTHER): Payer: Self-pay

## 2022-08-26 DIAGNOSIS — K1321 Leukoplakia of oral mucosa, including tongue: Secondary | ICD-10-CM | POA: Diagnosis not present

## 2022-08-31 ENCOUNTER — Other Ambulatory Visit (INDEPENDENT_AMBULATORY_CARE_PROVIDER_SITE_OTHER): Payer: Self-pay | Admitting: Nurse Practitioner

## 2022-08-31 DIAGNOSIS — Z9889 Other specified postprocedural states: Secondary | ICD-10-CM

## 2022-09-02 ENCOUNTER — Encounter: Payer: Self-pay | Admitting: Oncology

## 2022-09-06 ENCOUNTER — Ambulatory Visit (INDEPENDENT_AMBULATORY_CARE_PROVIDER_SITE_OTHER): Payer: PPO | Admitting: Vascular Surgery

## 2022-09-06 ENCOUNTER — Ambulatory Visit (INDEPENDENT_AMBULATORY_CARE_PROVIDER_SITE_OTHER): Payer: PPO

## 2022-09-06 ENCOUNTER — Encounter (INDEPENDENT_AMBULATORY_CARE_PROVIDER_SITE_OTHER): Payer: Self-pay | Admitting: Vascular Surgery

## 2022-09-06 VITALS — BP 157/76 | HR 87 | Resp 16 | Wt 127.8 lb

## 2022-09-06 DIAGNOSIS — Z9889 Other specified postprocedural states: Secondary | ICD-10-CM

## 2022-09-06 DIAGNOSIS — I6521 Occlusion and stenosis of right carotid artery: Secondary | ICD-10-CM

## 2022-09-06 DIAGNOSIS — I70213 Atherosclerosis of native arteries of extremities with intermittent claudication, bilateral legs: Secondary | ICD-10-CM

## 2022-09-06 DIAGNOSIS — I10 Essential (primary) hypertension: Secondary | ICD-10-CM | POA: Diagnosis not present

## 2022-09-06 DIAGNOSIS — E785 Hyperlipidemia, unspecified: Secondary | ICD-10-CM | POA: Diagnosis not present

## 2022-09-06 NOTE — Assessment & Plan Note (Signed)
Her ABIs today are 0.88 on the right and 0.96 on the left with multiphasic waveforms bilaterally and normal digital pressures bilaterally. Doing well. No change in medications. Recheck in one year.

## 2022-09-06 NOTE — Assessment & Plan Note (Signed)
Has not been checked recently. Check at her next visit.

## 2022-09-06 NOTE — Progress Notes (Signed)
MRN : [id number]  Jasmine Acosta is a 71 y.o. ([birth year]) female who presents with chief complaint of  Chief Complaint  Patient presents with   Follow-up    Ultrasound follow up  .  History of Present Illness: Patient returns today in follow up of her PAD.  About 2 years ago, she underwent kissing stent placements of both common iliac arteries for aortoiliac disease.  She has done well.  This has essentially resolved her claudication symptoms.  She is walking well.  Her ABIs today are 0.88 on the right and 0.96 on the left with multiphasic waveforms bilaterally and normal digital pressures bilaterally.  Current Outpatient Medications  Medication Sig Dispense Refill   amLODipine (NORVASC) 5 MG tablet Take 5 mg by mouth daily.   11   aspirin EC 81 MG tablet Take 1 tablet (81 mg total) by mouth daily. 150 tablet 2   clopidogrel (PLAVIX) 75 MG tablet Take 1 tablet (75 mg total) by mouth daily with breakfast. 30 tablet 11   guaiFENesin (MUCINEX) 600 MG 12 hr tablet Take 600 mg by mouth 2 (two) times daily.     meloxicam (MOBIC) 15 MG tablet Take by mouth.     Multiple Vitamin (MULTIVITAMIN) capsule Take 1 capsule by mouth daily.     potassium chloride (K-DUR,KLOR-CON) 10 MEQ tablet Take 10 mEq by mouth daily.     rosuvastatin (CRESTOR) 40 MG tablet Take 40 mg by mouth daily.     No current facility-administered medications for this visit.    Past Medical History:  Diagnosis Date   Carotid artery occlusion    COPD (chronic obstructive pulmonary disease) ([place]) 07/06/2015   Coronary artery disease    Headache    Hypertension    Hyperthyroidism    ablated with iodine   Peripheral vascular disease ([place])    Bilateral Carotid Artery Disease   Personal history of arterial venous malformation (AVM)    Pseudoclaudication     Past Surgical History:  Procedure Laterality Date   ABDOMINAL HYSTERECTOMY     heavy bleeding   CATARACT EXTRACTION W/PHACO Right 10/09/2018   Procedure: CATARACT  EXTRACTION PHACO AND INTRAOCULAR LENS PLACEMENT ([place]) RIGHT;  Surgeon: Leandrew Koyanagi, MD;  Location: [facility];  Service: Ophthalmology;  Laterality: Right;   CATARACT EXTRACTION W/PHACO Left 01/01/2019   Procedure: CATARACT EXTRACTION PHACO AND INTRAOCULAR LENS PLACEMENT ([place])  LEFT;  Surgeon: Leandrew Koyanagi, MD;  Location: [facility];  Service: Ophthalmology;  Laterality: Left;   COLONOSCOPY WITH PROPOFOL N/A 10/30/2019   Procedure: COLONOSCOPY WITH PROPOFOL;  Surgeon: Toledo, Benay Pike, MD;  Location: [facility] ENDOSCOPY;  Service: Gastroenterology;  Laterality: N/A;   CORONARY ANGIOPLASTY     ENDARTERECTOMY Right 06/14/2018   Procedure: ENDARTERECTOMY CAROTID;  Surgeon: Algernon Huxley, MD;  Location: [facility];  Service: Vascular;  Laterality: Right;   ESOPHAGOGASTRODUODENOSCOPY N/A 07/05/2019   Procedure: ESOPHAGOGASTRODUODENOSCOPY (EGD);  Surgeon: Lollie Sails, MD;  Location: [facility] ENDOSCOPY;  Service: Endoscopy;  Laterality: N/A;   ESOPHAGOGASTRODUODENOSCOPY (EGD) WITH PROPOFOL N/A 07/08/2019   Procedure: ESOPHAGOGASTRODUODENOSCOPY (EGD) WITH PROPOFOL;  Surgeon: Lollie Sails, MD;  Location: [facility] ENDOSCOPY;  Service: Endoscopy;  Laterality: N/A;   EYE SURGERY     hallux vagus correction Right    HALLUX VALGUS AUSTIN Left 07/01/2016   Procedure: HALLUX VALGUS AUSTIN left foot;  Surgeon: Sharlotte Alamo, DPM;  Location: [facility];  Service: Podiatry;  Laterality: Left;   LOWER EXTREMITY ANGIOGRAPHY Right 11/05/2018   Procedure: LOWER  EXTREMITY ANGIOGRAPHY;  Surgeon: Algernon Huxley, MD;  Location: [facility];  Service: Cardiovascular;  Laterality: Right;     Social History   Tobacco Use   Smoking status: Former    Packs/day: 1.00    Years: 50.00    Total pack years: 50.00    Types: Cigarettes    Quit date: 06/14/2018    Years since quitting: 4.2   Smokeless tobacco: Never   Tobacco comments:    since age 2  Vaping Use   Vaping Use: Never used   Substance Use Topics   Alcohol use: No   Drug use: No      Family History  Problem Relation Age of Onset   Diabetes Mother    Emphysema Father    Heart disease Sister    Heart disease Brother    Breast cancer Neg Hx    Ovarian cancer Neg Hx    Colon cancer Neg Hx      Allergies  Allergen Reactions   Lisinopril Other (See Comments)    Other reaction(s): Angioedema Of tongue only   Codeine Nausea And Vomiting    REVIEW OF SYSTEMS (Negative unless checked)   Constitutional: '[]'$ Weight loss  '[]'$ Fever  '[]'$ Chills Cardiac: '[]'$ Chest pain   '[]'$ Chest pressure   '[]'$ Palpitations   '[]'$ Shortness of breath when laying flat   '[]'$ Shortness of breath at rest   '[]'$ Shortness of breath with exertion. Vascular:  '[x]'$ Pain in legs with walking   '[x]'$ Pain in legs at rest   '[]'$ Pain in legs when laying flat   '[x]'$ Claudication   '[]'$ Pain in feet when walking  '[]'$ Pain in feet at rest  '[]'$ Pain in feet when laying flat   '[]'$ History of DVT   '[]'$ Phlebitis   '[]'$ Swelling in legs   '[]'$ Varicose veins   '[]'$ Non-healing ulcers Pulmonary:   '[]'$ Uses home oxygen   '[]'$ Productive cough   '[]'$ Hemoptysis   '[]'$ Wheeze  '[x]'$ COPD   '[]'$ Asthma Neurologic:  '[]'$ Dizziness  '[]'$ Blackouts   '[]'$ Seizures   '[]'$ History of stroke   '[]'$ History of TIA  '[]'$ Aphasia   '[]'$ Temporary blindness   '[]'$ Dysphagia   '[]'$ Weakness or numbness in arms   '[]'$ Weakness or numbness in legs Musculoskeletal:  '[x]'$ Arthritis   '[]'$ Joint swelling   '[x]'$ Joint pain   '[]'$ Low back pain Hematologic:  '[]'$ Easy bruising  '[]'$ Easy bleeding   '[]'$ Hypercoagulable state   '[]'$ Anemic  '[]'$ Hepatitis Gastrointestinal:  '[]'$ Blood in stool   '[]'$ Vomiting blood  '[x]'$ Gastroesophageal reflux/heartburn   '[]'$ Abdominal pain Genitourinary:  '[]'$ Chronic kidney disease   '[]'$ Difficult urination  '[]'$ Frequent urination  '[]'$ Burning with urination   '[]'$ Hematuria Skin:  '[]'$ Rashes   '[]'$ Ulcers   '[]'$ Wounds Psychological:  '[]'$ History of anxiety   '[]'$  History of major depression.  Physical Examination  BP (!) 157/76 (BP Location: Left Arm)   Pulse 87   Resp 16   Wt  127 lb 12.8 oz (58 kg)   BMI 24.96 kg/m  Gen:  WD/WN, NAD Head: [place]/AT, No temporalis wasting. Ear/Nose/Throat: Hearing grossly intact, nares w/o erythema or drainage Eyes: Conjunctiva clear. Sclera non-icteric Neck: Supple.  Trachea midline Pulmonary:  Good air movement, no use of accessory muscles.  Cardiac: RRR, no JVD Vascular:  Vessel Right Left  Radial Palpable Palpable                          PT Palpable Palpable  DP Palpable Palpable   Gastrointestinal: soft, non-tender/non-distended. No guarding/reflex.  Musculoskeletal: M/S 5/5 throughout.  No deformity or atrophy. No edema. Neurologic: Sensation  grossly intact in extremities.  Symmetrical.  Speech is fluent.  Psychiatric: Judgment intact, Mood & affect appropriate for pt's clinical situation. Dermatologic: No rashes or ulcers noted.  No cellulitis or open wounds.      Labs Recent Results (from the past 2160 hour(s))  Iron and TIBC([facility])     Status: Abnormal   Collection Time: 07/27/22  1:50 PM  Result Value Ref Range   Iron 22 (L) 28 - 170 ug/dL   TIBC 419 250 - 450 ug/dL   Saturation Ratios 5 (L) 10.4 - 31.8 %   UIBC 397 ug/dL    Comment: Performed at [facility], [address] [place], [place] [address]  CBC with Differential     Status: Abnormal   Collection Time: 07/28/22  2:34 PM  Result Value Ref Range   WBC 6.6 4.0 - 10.5 K/uL   RBC 4.04 3.87 - 5.11 MIL/uL   Hemoglobin 9.3 (L) 12.0 - 15.0 g/dL    Comment: Reticulocyte Hemoglobin testing may be clinically indicated, consider ordering this additional test [id number]    HCT 31.9 (L) 36.0 - 46.0 %   MCV 79.0 (L) 80.0 - 100.0 fL   MCH 23.0 (L) 26.0 - 34.0 pg   MCHC 29.2 (L) 30.0 - 36.0 g/dL   RDW 16.1 (H) 11.5 - 15.5 %   Platelets 238 150 - 400 K/uL   nRBC 0.0 0.0 - 0.2 %   Neutrophils Relative % 58 %   Neutro Abs 3.8 1.7 - 7.7 K/uL   Lymphocytes Relative 30 %   Lymphs Abs 2.0 0.7 - 4.0 K/uL   Monocytes Relative 8 %    Monocytes Absolute 0.6 0.1 - 1.0 K/uL   Eosinophils Relative 3 %   Eosinophils Absolute 0.2 0.0 - 0.5 K/uL   Basophils Relative 1 %   Basophils Absolute 0.1 0.0 - 0.1 K/uL   Immature Granulocytes 0 %   Abs Immature Granulocytes 0.02 0.00 - 0.07 K/uL    Comment: Performed at [facility], [address] [place], [place] [address]  Ferritin     Status: Abnormal   Collection Time: 07/28/22  2:34 PM  Result Value Ref Range   Ferritin 6 (L) 11 - 307 ng/mL    Comment: Performed at [facility], [address] [place], [place] [address]    Radiology No results found.  Assessment/Plan Essential hypertension blood pressure control important in reducing the progression of atherosclerotic disease. On appropriate oral medications.     Hyperlipidemia lipid control important in reducing the progression of atherosclerotic disease. Continue statin therapy  Atherosclerosis of native arteries of extremity with intermittent claudication (HCC) Her ABIs today are 0.88 on the right and 0.96 on the left with multiphasic waveforms bilaterally and normal digital pressures bilaterally. Doing well. No change in medications. Recheck in one year.  Carotid stenosis, asymptomatic, right Has not been checked recently. Check at her next visit.     Leotis Pain, MD  09/06/2022 9:57 AM    This note was created with Dragon medical transcription system.  Any errors from dictation are purely unintentional

## 2022-09-08 DIAGNOSIS — K1321 Leukoplakia of oral mucosa, including tongue: Secondary | ICD-10-CM | POA: Diagnosis not present

## 2022-09-09 ENCOUNTER — Ambulatory Visit (LOCAL_COMMUNITY_HEALTH_CENTER): Payer: PPO

## 2022-09-09 DIAGNOSIS — Z23 Encounter for immunization: Secondary | ICD-10-CM

## 2022-09-09 DIAGNOSIS — Z719 Counseling, unspecified: Secondary | ICD-10-CM

## 2022-09-09 NOTE — Progress Notes (Signed)
Client seen in [facility] for [facility]  VIS given.    Screening Questions  Are you feeling sick today? No   Have you ever received a dose of COVID-19 Vaccine? [employer], [place], [place], [place], Other) Yes  If yes, which vaccine and how many doses?    Moderna   3 dose  Did you bring the vaccination record card or other documentation?  Yes   Do you have a health condition or are undergoing treatment that makes you moderately or severely immunocompromised? This would include, but not be limited to: cancer, HIV, organ transplant, immunosuppressive therapy/high-dose corticosteroids, or moderate/severe primary immunodeficiency.  No  Have you received COVID-19 vaccine before or during hematopoietic cell transplant (HCT) or CAR-T-cell therapies? No  Have you ever had an allergic reaction to: (This would include a severe allergic reaction or a reaction that caused hives, swelling, or respiratory distress, including wheezing.) A component of a COVID-19 vaccine or a previous dose of COVID-19 vaccine? No   Have you ever had an allergic reaction to another vaccine (other thanCOVID-19 vaccine) or an injectable medication? (This would include a severe allergic reaction or a reaction that caused hives, swelling, or respiratory distress, including wheezing.)   No    Do you have a history of any of the following:  Myocarditis or Pericarditis No  Dermal fillers:  No  Multisystem Inflammatory Syndrome (MIS-C or MIS-A)? No  COVID-19 disease within the past 3 months? No  Vaccinated with monkeypox vaccine in the last 4 weeks? No   Vaccine administered and tolerated well.  After vaccine care reviewed. Consulted to stay for 15 minutes post vaccination.   Declined observation.    Marland Kitchenme

## 2022-09-28 ENCOUNTER — Encounter: Payer: Self-pay | Admitting: Oncology

## 2022-10-11 ENCOUNTER — Other Ambulatory Visit: Payer: Self-pay | Admitting: Internal Medicine

## 2022-10-11 DIAGNOSIS — Z1231 Encounter for screening mammogram for malignant neoplasm of breast: Secondary | ICD-10-CM

## 2022-10-19 ENCOUNTER — Ambulatory Visit
Admission: RE | Admit: 2022-10-19 | Discharge: 2022-10-19 | Disposition: A | Discharge status: Home / Self Care | Payer: PPO | Source: Ambulatory Visit | Attending: Internal Medicine | Admitting: Internal Medicine

## 2022-10-19 DIAGNOSIS — Z1231 Encounter for screening mammogram for malignant neoplasm of breast: Secondary | ICD-10-CM | POA: Diagnosis not present

## 2022-10-27 ENCOUNTER — Other Ambulatory Visit: Payer: Self-pay

## 2022-10-27 DIAGNOSIS — D509 Iron deficiency anemia, unspecified: Secondary | ICD-10-CM

## 2022-10-28 ENCOUNTER — Inpatient Hospital Stay: Payer: PPO | Attending: Nurse Practitioner | Admitting: Medical Oncology

## 2022-10-28 ENCOUNTER — Encounter: Payer: Self-pay | Admitting: Medical Oncology

## 2022-10-28 ENCOUNTER — Inpatient Hospital Stay: Payer: PPO

## 2022-10-28 VITALS — BP 131/60 | HR 94 | Temp 98.0°F | Wt 127.0 lb

## 2022-10-28 DIAGNOSIS — D509 Iron deficiency anemia, unspecified: Secondary | ICD-10-CM | POA: Diagnosis not present

## 2022-10-28 LAB — CBC WITH DIFFERENTIAL/PLATELET
Abs Immature Granulocytes: 0.01 10*3/uL (ref 0.00–0.07)
Basophils Absolute: 0 10*3/uL (ref 0.0–0.1)
Basophils Relative: 1 %
Eosinophils Absolute: 0.2 10*3/uL (ref 0.0–0.5)
Eosinophils Relative: 3 %
HCT: 30.9 % — ABNORMAL LOW (ref 36.0–46.0)
Hemoglobin: 9.6 g/dL — ABNORMAL LOW (ref 12.0–15.0)
Immature Granulocytes: 0 %
Lymphocytes Relative: 33 %
Lymphs Abs: 1.8 10*3/uL (ref 0.7–4.0)
MCH: 29.1 pg (ref 26.0–34.0)
MCHC: 31.1 g/dL (ref 30.0–36.0)
MCV: 93.6 fL (ref 80.0–100.0)
Monocytes Absolute: 0.5 10*3/uL (ref 0.1–1.0)
Monocytes Relative: 9 %
Neutro Abs: 3 10*3/uL (ref 1.7–7.7)
Neutrophils Relative %: 54 %
Platelets: 248 10*3/uL (ref 150–400)
RBC: 3.3 MIL/uL — ABNORMAL LOW (ref 3.87–5.11)
RDW: 14.1 % (ref 11.5–15.5)
WBC: 5.5 10*3/uL (ref 4.0–10.5)
nRBC: 0 % (ref 0.0–0.2)

## 2022-10-28 LAB — IRON AND TIBC
Iron: 32 ug/dL (ref 28–170)
Saturation Ratios: 9 % — ABNORMAL LOW (ref 10.4–31.8)
TIBC: 374 ug/dL (ref 250–450)
UIBC: 342 ug/dL

## 2022-10-28 LAB — FERRITIN: Ferritin: 9 ng/mL — ABNORMAL LOW (ref 11–307)

## 2022-10-28 NOTE — Progress Notes (Addendum)
[place] [facility]  [phone] [phone]  ID: Jasmine Acosta OB: 06-24-1951  MR#: [id number]  YWV#:[id number]  Patient Care Team: Kirk Ruths, MD as PCP - General (Internal Medicine) Lloyd Huger, MD as Consulting Physician (Oncology) Efrain Sella, MD as Consulting Physician (Gastroenterology)  CHIEF COMPLAINT: Iron deficiency anemia.  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  Prior to that, upper endoscopy on July 08, 2019 that revealed 1 angiectasia in the duodenum that was treated with argon plasma coagulation.  Colonoscopy on October 28, 2019 did not reveal any significant pathology.  Patient has required multiple doses of Feraheme since May 2021.  INTERVAL HISTORY: Patient returns to clinic today for repeat laboratory work, further evaluation, and consideration of IV Feraheme.  Last dose of IV Feraheme was on 08/04/2022.  Today she reports that she is feeling well. She has not had any problems since her last visit to our clinic. She has not had follow up with GI as suggested at the last visit. She denies fatigue or shortness of breath.  No chest pain.  She denies dark or tarry stools.  No overt bleeding or melena. Per patient she has never tried oral iron.   REVIEW OF SYSTEMS:   Review of Systems  Constitutional: Negative.  Negative for fever, malaise/fatigue and weight loss.  Respiratory: Negative.  Negative for cough, hemoptysis and shortness of breath.   Cardiovascular: Negative.  Negative for chest pain and leg swelling.  Gastrointestinal: Negative.  Negative for abdominal pain, blood in stool and melena.  Genitourinary: Negative.  Negative for hematuria.  Musculoskeletal: Negative.  Negative for back pain.  Skin: Negative.  Negative for rash.  Neurological: Negative.  Negative for dizziness, focal weakness, weakness and headaches.   Psychiatric/Behavioral: Negative.  The patient is not nervous/anxious.     As per HPI. Otherwise, a complete review of systems is negative.  PAST MEDICAL HISTORY: Past Medical History:  Diagnosis Date   Carotid artery occlusion    COPD (chronic obstructive pulmonary disease) ([place]) 07/06/2015   Coronary artery disease    Headache    Hypertension    Hyperthyroidism    ablated with iodine   Peripheral vascular disease (HCC)    Bilateral Carotid Artery Disease   Personal history of arterial venous malformation (AVM)    Pseudoclaudication     PAST SURGICAL HISTORY: Past Surgical History:  Procedure Laterality Date   ABDOMINAL HYSTERECTOMY     heavy bleeding   CATARACT EXTRACTION W/PHACO Right 10/09/2018   Procedure: CATARACT EXTRACTION PHACO AND INTRAOCULAR LENS PLACEMENT ([place]) RIGHT;  Surgeon: Leandrew Koyanagi, MD;  Location: [facility];  Service: Ophthalmology;  Laterality: Right;   CATARACT EXTRACTION W/PHACO Left 01/01/2019   Procedure: CATARACT EXTRACTION PHACO AND INTRAOCULAR LENS PLACEMENT ([place])  LEFT;  Surgeon: Leandrew Koyanagi, MD;  Location: [facility];  Service: Ophthalmology;  Laterality: Left;   COLONOSCOPY WITH PROPOFOL N/A 10/30/2019   Procedure: COLONOSCOPY WITH PROPOFOL;  Surgeon: Toledo, Benay Pike, MD;  Location: [facility] ENDOSCOPY;  Service: Gastroenterology;  Laterality: N/A;   CORONARY ANGIOPLASTY     ENDARTERECTOMY Right 06/14/2018   Procedure: ENDARTERECTOMY CAROTID;  Surgeon: Algernon Huxley, MD;  Location: [facility];  Service: Vascular;  Laterality: Right;   ESOPHAGOGASTRODUODENOSCOPY N/A 07/05/2019   Procedure: ESOPHAGOGASTRODUODENOSCOPY (EGD);  Surgeon: Lollie Sails, MD;  Location: [facility] ENDOSCOPY;  Service: Endoscopy;  Laterality: N/A;   ESOPHAGOGASTRODUODENOSCOPY (EGD) WITH PROPOFOL N/A 07/08/2019  Procedure: ESOPHAGOGASTRODUODENOSCOPY (EGD) WITH PROPOFOL;  Surgeon: Lollie Sails, MD;  Location: [facility] ENDOSCOPY;  Service: Endoscopy;   Laterality: N/A;   EYE SURGERY     hallux vagus correction Right    HALLUX VALGUS AUSTIN Left 07/01/2016   Procedure: HALLUX VALGUS AUSTIN left foot;  Surgeon: Sharlotte Alamo, DPM;  Location: [facility];  Service: Podiatry;  Laterality: Left;   LOWER EXTREMITY ANGIOGRAPHY Right 11/05/2018   Procedure: LOWER EXTREMITY ANGIOGRAPHY;  Surgeon: Algernon Huxley, MD;  Location: [place];  Service: Cardiovascular;  Laterality: Right;    FAMILY HISTORY: Family History  Problem Relation Age of Onset   Diabetes Mother    Emphysema Father    Heart disease Sister    Heart disease Brother    Breast cancer Neg Hx    Ovarian cancer Neg Hx    Colon cancer Neg Hx     ADVANCED DIRECTIVES (Y/N):  N  HEALTH MAINTENANCE: Social History   Tobacco Use   Smoking status: Former    Packs/day: 1.00    Years: 50.00    Total pack years: 50.00    Types: Cigarettes    Quit date: 06/14/2018    Years since quitting: 4.3   Smokeless tobacco: Never   Tobacco comments:    since age 72  Vaping Use   Vaping Use: Never used  Substance Use Topics   Alcohol use: No   Drug use: No     Colonoscopy:  PAP:  Bone density:  Lipid panel:  Allergies  Allergen Reactions   Lisinopril Other (See Comments)    Other reaction(s): Angioedema Of tongue only   Codeine Nausea And Vomiting    Current Outpatient Medications  Medication Sig Dispense Refill   amLODipine (NORVASC) 5 MG tablet Take 5 mg by mouth daily.   11   aspirin EC 81 MG tablet Take 1 tablet (81 mg total) by mouth daily. 150 tablet 2   clopidogrel (PLAVIX) 75 MG tablet Take 1 tablet (75 mg total) by mouth daily with breakfast. 30 tablet 11   guaiFENesin (MUCINEX) 600 MG 12 hr tablet Take 600 mg by mouth 2 (two) times daily.     meloxicam (MOBIC) 15 MG tablet Take by mouth.     Multiple Vitamin (MULTIVITAMIN) capsule Take 1 capsule by mouth daily.     potassium chloride (K-DUR,KLOR-CON) 10 MEQ tablet Take 10 mEq by mouth daily.     rosuvastatin  (CRESTOR) 40 MG tablet Take 40 mg by mouth daily.     No current facility-administered medications for this visit.    OBJECTIVE: Vitals:   10/28/22 1351  BP: 131/60  Pulse: 94  Temp: 98 F (36.7 C)      Body mass index is 24.8 kg/m.    ECOG FS:0 - Asymptomatic  General: Well-developed, well-nourished, no acute distress. Eyes: Pink conjunctiva, anicteric sclera. HEENT: Normocephalic, moist mucous membranes. Lungs: No audible wheezing or coughing. Heart: Regular rate and rhythm. Abdomen: Soft, nontender, no obvious distention. Musculoskeletal: No edema, cyanosis, or clubbing. Neuro: Alert, answering all questions appropriately. Cranial nerves grossly intact. Skin: No rashes or petechiae noted. Psych: Normal affect.  LAB RESULTS:  Lab Results  Component Value Date   NA 137 06/11/2019   K 3.2 (L) 06/11/2019   CL 105 06/11/2019   CO2 23 06/11/2019   GLUCOSE 110 (H) 06/11/2019   BUN 9 06/11/2019   CREATININE 0.90 05/18/2022   CALCIUM 9.2 06/11/2019   PROT 7.7 06/11/2019   ALBUMIN 4.2 06/11/2019  AST 18 06/11/2019   ALT 17 06/11/2019   ALKPHOS 67 06/11/2019   BILITOT 0.6 06/11/2019   GFRNONAA >60 06/11/2019   GFRAA >60 06/11/2019    Lab Results  Component Value Date   WBC 5.5 10/28/2022   NEUTROABS 3.0 10/28/2022   HGB 9.6 (L) 10/28/2022   HCT 30.9 (L) 10/28/2022   MCV 93.6 10/28/2022   PLT 248 10/28/2022   Lab Results  Component Value Date   IRON 22 (L) 07/27/2022   TIBC 419 07/27/2022   IRONPCTSAT 5 (L) 07/27/2022   Lab Results  Component Value Date   FERRITIN 6 (L) 07/28/2022     STUDIES: MM 3D SCREEN BREAST BILATERAL  Result Date: 10/20/2022 CLINICAL DATA:  Screening. EXAM: DIGITAL SCREENING BILATERAL MAMMOGRAM WITH TOMOSYNTHESIS AND CAD TECHNIQUE: Bilateral screening digital craniocaudal and mediolateral oblique mammograms were obtained. Bilateral screening digital breast tomosynthesis was performed. The images were evaluated with  computer-aided detection. COMPARISON:  Previous exam(s). ACR Breast Density Category b: There are scattered areas of fibroglandular density. FINDINGS: There are no findings suspicious for malignancy. IMPRESSION: No mammographic evidence of malignancy. A result letter of this screening mammogram will be mailed directly to the patient. RECOMMENDATION: Screening mammogram in one year. (Code:SM-B-01Y) BI-RADS CATEGORY  1: Negative. Electronically Signed   By: Lajean Manes M.D.   On: 10/20/2022 11:53    ASSESSMENT: Iron deficiency anemia.  PLAN:    Iron deficiency anemia: Hemoglobin today is 9.6 from 9.3 back in Oct 2023. MCV normal. May benefit from [facility] if iron storage labs are low.  Return to clinic in 3 months for repeat labs/APP. She may benefit from oral iron once daily in the morning and with orange juice to help with her anemia. She will start this which may help lengthen interval between infusions. Discussed how to take along with common side effects and precautions. I have also suggested she follow up with GI given her continued IDA- she is agreeable.   Disposition: RTC 3 months with APP, labs (CBC w/, retic, CMP, ferritin, iron/TIBC)-[place]   UPDATE: Ferritin 9 with low iron saturation. TO be scheduled for Feraheme within the next 2 weeks with a second dose given a week later.    I spent a total of 15 minutes reviewing chart data, face-to-face evaluation with the patient, counseling and coordination of care as detailed above.  Patient expressed understanding and was in agreement with this plan. She also understands that She can call clinic at any time with any questions, concerns, or complaints.    Hughie Closs, PA-C   10/28/2022 2:02 PM

## 2022-10-28 NOTE — Addendum Note (Signed)
Addended by: Nelwyn Salisbury on: 10/28/2022 03:30 PM   Modules accepted: Orders

## 2022-11-01 ENCOUNTER — Inpatient Hospital Stay: Payer: PPO

## 2022-11-01 VITALS — BP 134/84 | HR 87 | Temp 98.4°F | Resp 18

## 2022-11-01 DIAGNOSIS — D509 Iron deficiency anemia, unspecified: Secondary | ICD-10-CM | POA: Diagnosis not present

## 2022-11-01 MED ORDER — SODIUM CHLORIDE 0.9 % IV SOLN
INTRAVENOUS | Status: DC
  Filled 2022-11-01: qty 250

## 2022-11-01 MED ORDER — SODIUM CHLORIDE 0.9 % IV SOLN
510.0000 mg | Freq: Once | INTRAVENOUS | Status: AC
  Administered 2022-11-01: 510 mg via INTRAVENOUS
  Filled 2022-11-01: qty 510

## 2022-11-01 NOTE — Patient Instructions (Signed)

## 2022-11-08 ENCOUNTER — Inpatient Hospital Stay: Payer: PPO

## 2022-11-08 ENCOUNTER — Telehealth: Payer: Self-pay | Admitting: *Deleted

## 2022-11-08 VITALS — BP 118/64 | HR 73 | Temp 97.4°F | Resp 18

## 2022-11-08 DIAGNOSIS — D509 Iron deficiency anemia, unspecified: Secondary | ICD-10-CM | POA: Diagnosis not present

## 2022-11-08 MED ORDER — SODIUM CHLORIDE 0.9 % IV SOLN
Freq: Once | INTRAVENOUS | Status: AC
  Filled 2022-11-08: qty 250

## 2022-11-08 MED ORDER — SODIUM CHLORIDE 0.9 % IV SOLN
510.0000 mg | Freq: Once | INTRAVENOUS | Status: AC
  Administered 2022-11-08: 510 mg via INTRAVENOUS
  Filled 2022-11-08: qty 510

## 2022-11-08 NOTE — Patient Outreach (Signed)
  Care Coordination   11/08/2022 Name: Jasmine Acosta MRN: [id number] DOB: 30-Nov-1950   Care Coordination Outreach Attempts:  An unsuccessful telephone outreach was attempted today to offer the patient information about available care coordination services as a benefit of their health plan.   Follow Up Plan:  Additional outreach attempts will be made to offer the patient care coordination information and services.   Encounter Outcome:  No Answer   Care Coordination Interventions:  No, not indicated    Valente David, RN, MSN, [facility] Care Management Care Management Coordinator [phone]

## 2022-11-11 ENCOUNTER — Telehealth: Payer: Self-pay | Admitting: *Deleted

## 2022-11-11 NOTE — Patient Outreach (Signed)
  Care Coordination   11/11/2022 Name: Jasmine Acosta MRN: [id number] DOB: [birth year]   Care Coordination Outreach Attempts:  A second unsuccessful outreach was attempted today to offer the patient with information about available care coordination services as a benefit of their health plan.     Follow Up Plan:  Additional outreach attempts will be made to offer the patient care coordination information and services.   Encounter Outcome:  No Answer   Care Coordination Interventions:  No, not indicated    Valente David, RN, MSN, [facility] Care Management Care Management Coordinator [phone]

## 2022-11-15 ENCOUNTER — Telehealth: Payer: Self-pay | Admitting: *Deleted

## 2022-11-15 NOTE — Patient Outreach (Signed)
  Care Coordination   11/15/2022 Name: Jasmine Acosta MRN: [id number] DOB: 1951-04-03   Care Coordination Outreach Attempts:  A third unsuccessful outreach was attempted today to offer the patient with information about available care coordination services as a benefit of their health plan.   Follow Up Plan:  No further outreach attempts will be made at this time. We have been unable to contact the patient to offer or enroll patient in care coordination services  Encounter Outcome:  No Answer   Care Coordination Interventions:  No, not indicated    Valente David, RN, MSN, [facility] Care Management Care Management Coordinator [phone]

## 2023-01-27 ENCOUNTER — Encounter: Payer: Self-pay | Admitting: Oncology

## 2023-01-27 ENCOUNTER — Inpatient Hospital Stay: Payer: PPO | Attending: Nurse Practitioner | Admitting: Oncology

## 2023-01-27 VITALS — BP 139/79 | HR 81 | Temp 97.8°F | Resp 16 | Ht 60.0 in | Wt 128.0 lb

## 2023-01-27 DIAGNOSIS — D509 Iron deficiency anemia, unspecified: Secondary | ICD-10-CM

## 2023-01-27 NOTE — Progress Notes (Signed)
[facility]  Telephone:([phone] Fax:(336) [phone]  ID: Johnathan Hausen OB: March 28, 1951  MR#: [id number]  EBR#:[id number]  Patient Care Team: Lauro Regulus, MD as PCP - General (Internal Medicine) Jeralyn Ruths, MD as Consulting Physician (Oncology) Stanton Kidney, MD as Consulting Physician (Gastroenterology)  CHIEF COMPLAINT: Iron deficiency anemia.  INTERVAL HISTORY: Patient returns to clinic today for repeat laboratory work, further evaluation, and consideration of IV Feraheme.  She continues to feel well and remains asymptomatic.  She does not complain of any weakness or fatigue today.  She has no neurologic complaints. She denies any recent fevers or illnesses.  She has a good appetite and denies weight loss.  She has no chest pain, shortness of breath, cough, or hemoptysis.  She denies any nausea, vomiting, constipation, or diarrhea.  She has no melena or hematochezia.  She has no urinary complaints.  Patient offers no specific complaints today.  REVIEW OF SYSTEMS:   Review of Systems  Constitutional: Negative.  Negative for fever, malaise/fatigue and weight loss.  Respiratory: Negative.  Negative for cough, hemoptysis and shortness of breath.   Cardiovascular: Negative.  Negative for chest pain and leg swelling.  Gastrointestinal: Negative.  Negative for abdominal pain, blood in stool and melena.  Genitourinary: Negative.  Negative for hematuria.  Musculoskeletal: Negative.  Negative for back pain.  Skin: Negative.  Negative for rash.  Neurological: Negative.  Negative for dizziness, focal weakness, weakness and headaches.  Psychiatric/Behavioral: Negative.  The patient is not nervous/anxious.     As per HPI. Otherwise, a complete review of systems is negative.  PAST MEDICAL HISTORY: Past Medical History:  Diagnosis Date   Carotid artery occlusion    COPD (chronic obstructive pulmonary disease) 07/06/2015   Coronary artery disease     Headache    Hypertension    Hyperthyroidism    ablated with iodine   Peripheral vascular disease    Bilateral Carotid Artery Disease   Personal history of arterial venous malformation (AVM)    Pseudoclaudication     PAST SURGICAL HISTORY: Past Surgical History:  Procedure Laterality Date   ABDOMINAL HYSTERECTOMY     heavy bleeding   CATARACT EXTRACTION W/PHACO Right 10/09/2018   Procedure: CATARACT EXTRACTION PHACO AND INTRAOCULAR LENS PLACEMENT (IOC) RIGHT;  Surgeon: Lockie Mola, MD;  Location: [facility];  Service: Ophthalmology;  Laterality: Right;   CATARACT EXTRACTION W/PHACO Left 01/01/2019   Procedure: CATARACT EXTRACTION PHACO AND INTRAOCULAR LENS PLACEMENT (IOC)  LEFT;  Surgeon: Lockie Mola, MD;  Location: [facility];  Service: Ophthalmology;  Laterality: Left;   COLONOSCOPY WITH PROPOFOL N/A 10/30/2019   Procedure: COLONOSCOPY WITH PROPOFOL;  Surgeon: Toledo, Boykin Nearing, MD;  Location: [facility] ENDOSCOPY;  Service: Gastroenterology;  Laterality: N/A;   CORONARY ANGIOPLASTY     ENDARTERECTOMY Right 06/14/2018   Procedure: ENDARTERECTOMY CAROTID;  Surgeon: Annice Needy, MD;  Location: [facility] ORS;  Service: Vascular;  Laterality: Right;   ESOPHAGOGASTRODUODENOSCOPY N/A 07/05/2019   Procedure: ESOPHAGOGASTRODUODENOSCOPY (EGD);  Surgeon: Christena Deem, MD;  Location[facility];  Service: Endoscopy;  Laterality: N/A;   ESOPHAGOGASTRODUODENOSCOPY (EGD) WITH PROPOFOL N/A 07/08/2019   Procedure: ESOPHAGOGASTRODUODENOSCOPY (EGD) WITH PROPOFOL;  Surgeon: Christena Deem, MD;  Location: [facility] ENDOSCOPY;  Service: Endoscopy;  Laterality: N/A;   EYE SURGERY     hallux vagus correction Right    HALLUX VALGUS AUSTIN Left 07/01/2016   Procedure: HALLUX VALGUS AUSTIN left foot;  Surgeon: Linus Galas, DPM;  Location: [facility] ORS;  Service: Podiatry;  Laterality: Left;   LOWER EXTREMITY ANGIOGRAPHY Right 11/05/2018   Procedure: LOWER EXTREMITY ANGIOGRAPHY;  Surgeon:  Annice Needyew, Jason S, MD;  Location: [facility] INVASIVE CV LAB;  Service: Cardiovascular;  Laterality: Right;    FAMILY HISTORY: Family History  Problem Relation Age of Onset   Diabetes Mother    Emphysema Father    Heart disease Sister    Heart disease Brother    Breast cancer Neg Hx    Ovarian cancer Neg Hx    Colon cancer Neg Hx     ADVANCED DIRECTIVES (Y/N):  N  HEALTH MAINTENANCE: Social History   Tobacco Use   Smoking status: Former    Packs/day: 1.00    Years: 50.00    Additional pack years: 0.00    Total pack years: 50.00    Types: Cigarettes    Quit date: 06/14/2018    Years since quitting: 4.6   Smokeless tobacco: Never   Tobacco comments:    since age 72  Vaping Use   Vaping Use: Never used  Substance Use Topics   Alcohol use: No   Drug use: No     Colonoscopy:  PAP:  Bone density:  Lipid panel:  Allergies  Allergen Reactions   Lisinopril Other (See Comments)    Other reaction(s): Angioedema Of tongue only   Codeine Nausea And Vomiting    Current Outpatient Medications  Medication Sig Dispense Refill   amLODipine (NORVASC) 5 MG tablet Take 5 mg by mouth daily.   11   aspirin EC 81 MG tablet Take 1 tablet (81 mg total) by mouth daily. 150 tablet 2   clopidogrel (PLAVIX) 75 MG tablet Take 1 tablet (75 mg total) by mouth daily with breakfast. 30 tablet 11   guaiFENesin (MUCINEX) 600 MG 12 hr tablet Take 600 mg by mouth 2 (two) times daily.     Iron, Ferrous Sulfate, 325 (65 Fe) MG TABS Take 65 mg/kg of iron by mouth.     meloxicam (MOBIC) 15 MG tablet Take by mouth.     Multiple Vitamin (MULTIVITAMIN) capsule Take 1 capsule by mouth daily.     potassium chloride (K-DUR,KLOR-CON) 10 MEQ tablet Take 10 mEq by mouth daily.     rosuvastatin (CRESTOR) 40 MG tablet Take 40 mg by mouth daily.     No current facility-administered medications for this visit.    OBJECTIVE: Vitals:   01/27/23 1038  BP: 139/79  Pulse: 81  Resp: 16  Temp: 97.8 F (36.6 C)   SpO2: 99%     Body mass index is 25 kg/m.    ECOG FS:0 - Asymptomatic  General: Well-developed, well-nourished, no acute distress. Eyes: Pink conjunctiva, anicteric sclera. HEENT: Normocephalic, moist mucous membranes. Lungs: No audible wheezing or coughing. Heart: Regular rate and rhythm. Abdomen: Soft, nontender, no obvious distention. Musculoskeletal: No edema, cyanosis, or clubbing. Neuro: Alert, answering all questions appropriately. Cranial nerves grossly intact. Skin: No rashes or petechiae noted. Psych: Normal affect.  LAB RESULTS:  Lab Results  Component Value Date   NA 137 06/11/2019   K 3.2 (L) 06/11/2019   CL 105 06/11/2019   CO2 23 06/11/2019   GLUCOSE 110 (H) 06/11/2019   BUN 9 06/11/2019   CREATININE 0.90 05/18/2022   CALCIUM 9.2 06/11/2019   PROT 7.7 06/11/2019   ALBUMIN 4.2 06/11/2019   AST 18 06/11/2019   ALT 17 06/11/2019   ALKPHOS 67 06/11/2019   BILITOT 0.6 06/11/2019   GFRNONAA >60 06/11/2019   GFRAA >60 06/11/2019  Lab Results  Component Value Date   WBC 5.5 10/28/2022   NEUTROABS 3.0 10/28/2022   HGB 9.6 (L) 10/28/2022   HCT 30.9 (L) 10/28/2022   MCV 93.6 10/28/2022   PLT 248 10/28/2022   Lab Results  Component Value Date   IRON 32 10/28/2022   TIBC 374 10/28/2022   IRONPCTSAT 9 (L) 10/28/2022   Lab Results  Component Value Date   FERRITIN 9 (L) 10/28/2022     STUDIES: No results found.  ASSESSMENT: Iron deficiency anemia.  PLAN:    Iron deficiency anemia: Patient's hemoglobin and iron stores remain decreased.  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  Prior to that, upper endoscopy on July 08, 2019 that revealed 1 angiectasia in the duodenum that was treated with argon plasma coagulation.  Colonoscopy on October 28, 2019 did not reveal any significant pathology.  Patient denies any melena or hematochezia, but may benefit from repeat endoscopy evaluation  in the near future.  Proceed with 510 mg IV Feraheme 3 times over the next 2 weeks.  Patient will then return to clinic in 3 months with repeat laboratory work, further evaluation, and consideration of additional treatment.   I spent a total of 30 minutes reviewing chart data, face-to-face evaluation with the patient, counseling and coordination of care as detailed above.   Patient expressed understanding and was in agreement with this plan. She also understands that She can call clinic at any time with any questions, concerns, or complaints.    Jeralyn Ruthsimothy J Kamala Kolton, MD   01/27/2023 1:08 PM

## 2023-01-31 DIAGNOSIS — J449 Chronic obstructive pulmonary disease, unspecified: Secondary | ICD-10-CM | POA: Diagnosis not present

## 2023-01-31 DIAGNOSIS — I10 Essential (primary) hypertension: Secondary | ICD-10-CM | POA: Diagnosis not present

## 2023-01-31 DIAGNOSIS — I779 Disorder of arteries and arterioles, unspecified: Secondary | ICD-10-CM | POA: Diagnosis not present

## 2023-01-31 MED FILL — Ferumoxytol Inj 510 MG/17ML (30 MG/ML) (Elemental Fe): INTRAVENOUS | Qty: 17 | Status: AC

## 2023-02-01 ENCOUNTER — Inpatient Hospital Stay: Payer: PPO

## 2023-02-01 VITALS — BP 116/66 | HR 73 | Temp 98.5°F | Resp 16

## 2023-02-01 DIAGNOSIS — D509 Iron deficiency anemia, unspecified: Secondary | ICD-10-CM | POA: Diagnosis not present

## 2023-02-01 MED ORDER — SODIUM CHLORIDE 0.9 % IV SOLN
Freq: Once | INTRAVENOUS | Status: AC
  Filled 2023-02-01: qty 250

## 2023-02-01 MED ORDER — SODIUM CHLORIDE 0.9 % IV SOLN
510.0000 mg | Freq: Once | INTRAVENOUS | Status: AC
  Administered 2023-02-01: 510 mg via INTRAVENOUS
  Filled 2023-02-01: qty 510

## 2023-02-01 NOTE — Patient Instructions (Signed)

## 2023-02-07 DIAGNOSIS — Z0000 Encounter for general adult medical examination without abnormal findings: Secondary | ICD-10-CM | POA: Diagnosis not present

## 2023-02-07 DIAGNOSIS — J449 Chronic obstructive pulmonary disease, unspecified: Secondary | ICD-10-CM | POA: Diagnosis not present

## 2023-02-07 DIAGNOSIS — I739 Peripheral vascular disease, unspecified: Secondary | ICD-10-CM | POA: Diagnosis not present

## 2023-02-07 DIAGNOSIS — I779 Disorder of arteries and arterioles, unspecified: Secondary | ICD-10-CM | POA: Diagnosis not present

## 2023-02-07 DIAGNOSIS — I10 Essential (primary) hypertension: Secondary | ICD-10-CM | POA: Diagnosis not present

## 2023-02-07 DIAGNOSIS — I700 Atherosclerosis of aorta: Secondary | ICD-10-CM | POA: Diagnosis not present

## 2023-02-08 ENCOUNTER — Inpatient Hospital Stay: Payer: PPO

## 2023-02-08 VITALS — BP 115/56 | HR 72 | Temp 98.5°F | Resp 18

## 2023-02-08 DIAGNOSIS — D509 Iron deficiency anemia, unspecified: Secondary | ICD-10-CM | POA: Diagnosis not present

## 2023-02-08 MED ORDER — SODIUM CHLORIDE 0.9 % IV SOLN
INTRAVENOUS | Status: DC
  Filled 2023-02-08: qty 250

## 2023-02-08 MED ORDER — SODIUM CHLORIDE 0.9 % IV SOLN
510.0000 mg | Freq: Once | INTRAVENOUS | Status: AC
  Administered 2023-02-08: 510 mg via INTRAVENOUS
  Filled 2023-02-08: qty 17

## 2023-02-10 ENCOUNTER — Inpatient Hospital Stay: Payer: PPO

## 2023-02-10 VITALS — BP 125/74 | HR 78 | Temp 96.4°F | Resp 16

## 2023-02-10 DIAGNOSIS — D509 Iron deficiency anemia, unspecified: Secondary | ICD-10-CM | POA: Diagnosis not present

## 2023-02-10 MED ORDER — SODIUM CHLORIDE 0.9 % IV SOLN
INTRAVENOUS | Status: DC
  Filled 2023-02-10: qty 250

## 2023-02-10 MED ORDER — SODIUM CHLORIDE 0.9 % IV SOLN
510.0000 mg | Freq: Once | INTRAVENOUS | Status: AC
  Administered 2023-02-10: 510 mg via INTRAVENOUS
  Filled 2023-02-10: qty 510

## 2023-02-10 NOTE — Patient Instructions (Signed)
[facility]  Discharge Instructions: Thank you for choosing [facility] to provide your oncology and hematology care.  If you have a lab appointment with the Cancer Center, please go directly to the Cancer Center and check in at the registration area.  Wear comfortable clothing and clothing appropriate for easy access to any Portacath or PICC line.   We strive to give you quality time with your provider. You may need to reschedule your appointment if you arrive late (15 or more minutes).  Arriving late affects you and other patients whose appointments are after yours.  Also, if you miss three or more appointments without notifying the office, you may be dismissed from the clinic at the provider's discretion.      For prescription refill requests, have your pharmacy contact our office and allow 72 hours for refills to be completed.    Today you received the following chemotherapy and/or immunotherapy agents Feraheme.      To help prevent nausea and vomiting after your treatment, we encourage you to take your nausea medication as directed.  BELOW ARE SYMPTOMS THAT SHOULD BE REPORTED IMMEDIATELY: *FEVER GREATER THAN 100.4 F (38 C) OR HIGHER *CHILLS OR SWEATING *NAUSEA AND VOMITING THAT IS NOT CONTROLLED WITH YOUR NAUSEA MEDICATION *UNUSUAL SHORTNESS OF BREATH *UNUSUAL BRUISING OR BLEEDING *URINARY PROBLEMS (pain or burning when urinating, or frequent urination) *BOWEL PROBLEMS (unusual diarrhea, constipation, pain near the anus) TENDERNESS IN MOUTH AND THROAT WITH OR WITHOUT PRESENCE OF ULCERS (sore throat, sores in mouth, or a toothache) UNUSUAL RASH, SWELLING OR PAIN  UNUSUAL VAGINAL DISCHARGE OR ITCHING   Items with * indicate a potential emergency and should be followed up as soon as possible or go to the Emergency Department if any problems should occur.  Please show the CHEMOTHERAPY ALERT CARD or IMMUNOTHERAPY ALERT CARD at check-in to  the Emergency Department and triage nurse.  Should you have questions after your visit or need to cancel or reschedule your appointment, please contact [facility]  [phone] and follow the prompts.  Office hours are 8:00 a.m. to 4:30 p.m. Monday - Friday. Please note that voicemails left after 4:00 p.m. may not be returned until the following business day.  We are closed weekends and major holidays. You have access to a nurse at all times for urgent questions. Please call the main number to the clinic [phone] and follow the prompts.  For any non-urgent questions, you may also contact your provider using MyChart. We now offer e-Visits for anyone 18 and older to request care online for non-urgent symptoms. For details visit [email or web address].   Also download the MyChart app! Go to the app store, search "MyChart", open the app, select [place] [facility], and log in with your MyChart username and password.    

## 2023-05-03 ENCOUNTER — Inpatient Hospital Stay: Payer: PPO | Attending: Nurse Practitioner

## 2023-05-03 DIAGNOSIS — D509 Iron deficiency anemia, unspecified: Secondary | ICD-10-CM | POA: Insufficient documentation

## 2023-05-03 LAB — IRON AND TIBC
Iron: 65 ug/dL (ref 28–170)
Saturation Ratios: 21 % (ref 10.4–31.8)
TIBC: 311 ug/dL (ref 250–450)
UIBC: 246 ug/dL

## 2023-05-03 LAB — CBC WITH DIFFERENTIAL/PLATELET
Abs Immature Granulocytes: 0.01 10*3/uL (ref 0.00–0.07)
Basophils Absolute: 0 10*3/uL (ref 0.0–0.1)
Basophils Relative: 1 %
Eosinophils Absolute: 0.1 10*3/uL (ref 0.0–0.5)
Eosinophils Relative: 1 %
HCT: 42.7 % (ref 36.0–46.0)
Hemoglobin: 13.6 g/dL (ref 12.0–15.0)
Immature Granulocytes: 0 %
Lymphocytes Relative: 20 %
Lymphs Abs: 1.3 10*3/uL (ref 0.7–4.0)
MCH: 30.8 pg (ref 26.0–34.0)
MCHC: 31.9 g/dL (ref 30.0–36.0)
MCV: 96.8 fL (ref 80.0–100.0)
Monocytes Absolute: 0.4 10*3/uL (ref 0.1–1.0)
Monocytes Relative: 6 %
Neutro Abs: 4.8 10*3/uL (ref 1.7–7.7)
Neutrophils Relative %: 72 %
Platelets: 187 10*3/uL (ref 150–400)
RBC: 4.41 MIL/uL (ref 3.87–5.11)
RDW: 11.5 % (ref 11.5–15.5)
WBC: 6.7 10*3/uL (ref 4.0–10.5)
nRBC: 0 % (ref 0.0–0.2)

## 2023-05-03 LAB — FERRITIN: Ferritin: 102 ng/mL (ref 11–307)

## 2023-05-03 MED FILL — Ferumoxytol Inj 510 MG/17ML (30 MG/ML) (Elemental Fe): INTRAVENOUS | Qty: 17 | Status: AC

## 2023-05-04 ENCOUNTER — Inpatient Hospital Stay: Payer: PPO | Admitting: Oncology

## 2023-05-04 ENCOUNTER — Inpatient Hospital Stay: Payer: PPO

## 2023-05-11 ENCOUNTER — Inpatient Hospital Stay: Payer: PPO

## 2023-05-11 ENCOUNTER — Inpatient Hospital Stay: Payer: PPO | Admitting: Oncology

## 2023-05-11 ENCOUNTER — Encounter: Payer: Self-pay | Admitting: Oncology

## 2023-05-11 VITALS — BP 157/71 | HR 69 | Temp 96.4°F | Resp 16 | Ht 60.0 in | Wt 127.0 lb

## 2023-05-11 DIAGNOSIS — D509 Iron deficiency anemia, unspecified: Secondary | ICD-10-CM | POA: Diagnosis not present

## 2023-05-11 MED FILL — Ferumoxytol Inj 510 MG/17ML (30 MG/ML) (Elemental Fe): INTRAVENOUS | Qty: 17 | Status: AC

## 2023-05-11 NOTE — Progress Notes (Signed)
[facility]  Telephone:(336) [phone] Fax:(336) [phone]  ID: Jasmine Acosta OB: October 23, 1951  MR#: [id number]  AOZ#:[id number]  Patient Care Team: Lauro Regulus, MD as PCP - General (Internal Medicine) Jeralyn Ruths, MD as Consulting Physician (Oncology) Stanton Kidney, MD as Consulting Physician (Gastroenterology)  CHIEF COMPLAINT: Iron deficiency anemia.  INTERVAL HISTORY: Patient returns to clinic today for repeat laboratory work, further evaluation, and consideration of additional IV iron.  She continues to feel well and remains asymptomatic.  She does not complain of any weakness or fatigue today. She has no neurologic complaints. She denies any recent fevers or illnesses.  She has a good appetite and denies weight loss.  She has no chest pain, shortness of breath, cough, or hemoptysis.  She denies any nausea, vomiting, constipation, or diarrhea.  She has no melena or hematochezia.  She has no urinary complaints.  Patient offers no specific complaints today.  REVIEW OF SYSTEMS:   Review of Systems  Constitutional: Negative.  Negative for fever, malaise/fatigue and weight loss.  Respiratory: Negative.  Negative for cough, hemoptysis and shortness of breath.   Cardiovascular: Negative.  Negative for chest pain and leg swelling.  Gastrointestinal: Negative.  Negative for abdominal pain, blood in stool and melena.  Genitourinary: Negative.  Negative for hematuria.  Musculoskeletal: Negative.  Negative for back pain.  Skin: Negative.  Negative for rash.  Neurological: Negative.  Negative for dizziness, focal weakness, weakness and headaches.  Psychiatric/Behavioral: Negative.  The patient is not nervous/anxious.     As per HPI. Otherwise, a complete review of systems is negative.  PAST MEDICAL HISTORY: Past Medical History:  Diagnosis Date   Carotid artery occlusion    COPD (chronic obstructive pulmonary disease) (HCC) 07/06/2015   Coronary artery  disease    Headache    Hypertension    Hyperthyroidism    ablated with iodine   Peripheral vascular disease (HCC)    Bilateral Carotid Artery Disease   Personal history of arterial venous malformation (AVM)    Pseudoclaudication     PAST SURGICAL HISTORY: Past Surgical History:  Procedure Laterality Date   ABDOMINAL HYSTERECTOMY     heavy bleeding   CATARACT EXTRACTION W/PHACO Right 10/09/2018   Procedure: CATARACT EXTRACTION PHACO AND INTRAOCULAR LENS PLACEMENT (IOC) RIGHT;  Surgeon: Lockie Mola, MD;  Location: [facility];  Service: Ophthalmology;  Laterality: Right;   CATARACT EXTRACTION W/PHACO Left 01/01/2019   Procedure: CATARACT EXTRACTION PHACO AND INTRAOCULAR LENS PLACEMENT (IOC)  LEFT;  Surgeon: Lockie Mola, MD;  Location: [facility];  Service: Ophthalmology;  Laterality: Left;   COLONOSCOPY WITH PROPOFOL N/A 10/30/2019   Procedure: COLONOSCOPY WITH PROPOFOL;  Surgeon: Toledo, Boykin Nearing, MD;  Location: [facility] ENDOSCOPY;  Service: Gastroenterology;  Laterality: N/A;   CORONARY ANGIOPLASTY     ENDARTERECTOMY Right 06/14/2018   Procedure: ENDARTERECTOMY CAROTID;  Surgeon: Annice Needy, MD;  Location: [facility] ORS;  Service: Vascular;  Laterality: Right;   ESOPHAGOGASTRODUODENOSCOPY N/A 07/05/2019   Procedure: ESOPHAGOGASTRODUODENOSCOPY (EGD);  Surgeon: Christena Deem, MD;  Location: [facility] ENDOSCOPY;  Service: Endoscopy;  Laterality: N/A;   ESOPHAGOGASTRODUODENOSCOPY (EGD) WITH PROPOFOL N/A 07/08/2019   Procedure: ESOPHAGOGASTRODUODENOSCOPY (EGD) WITH PROPOFOL;  Surgeon: Christena Deem, MD;  Location: [facility] ENDOSCOPY;  Service: Endoscopy;  Laterality: N/A;   EYE SURGERY     hallux vagus correction Right    HALLUX VALGUS AUSTIN Left 07/01/2016   Procedure: HALLUX VALGUS AUSTIN left foot;  Surgeon: Linus Galas, DPM;  Location: [facility] ORS;  Service: Podiatry;  Laterality: Left;   LOWER EXTREMITY ANGIOGRAPHY Right 11/05/2018   Procedure: LOWER EXTREMITY  ANGIOGRAPHY;  Surgeon: Annice Needy, MD;  Location: [facility] INVASIVE CV LAB;  Service: Cardiovascular;  Laterality: Right;    FAMILY HISTORY: Family History  Problem Relation Age of Onset   Diabetes Mother    Emphysema Father    Heart disease Sister    Heart disease Brother    Breast cancer Neg Hx    Ovarian cancer Neg Hx    Colon cancer Neg Hx     ADVANCED DIRECTIVES (Y/N):  N  HEALTH MAINTENANCE: Social History   Tobacco Use   Smoking status: Former    Current packs/day: 0.00    Average packs/day: 1 pack/day for 50.0 years (50.0 ttl pk-yrs)    Types: Cigarettes    Start date: 06/14/1968    Quit date: 06/14/2018    Years since quitting: 4.9   Smokeless tobacco: Never   Tobacco comments:    since age 8  Vaping Use   Vaping status: Never Used  Substance Use Topics   Alcohol use: No   Drug use: No     Colonoscopy:  PAP:  Bone density:  Lipid panel:  Allergies  Allergen Reactions   Lisinopril Other (See Comments)    Other reaction(s): Angioedema Of tongue only   Codeine Nausea And Vomiting    Current Outpatient Medications  Medication Sig Dispense Refill   amLODipine (NORVASC) 5 MG tablet Take 5 mg by mouth daily.   11   aspirin EC 81 MG tablet Take 1 tablet (81 mg total) by mouth daily. 150 tablet 2   clopidogrel (PLAVIX) 75 MG tablet Take 1 tablet (75 mg total) by mouth daily with breakfast. 30 tablet 11   guaiFENesin (MUCINEX) 600 MG 12 hr tablet Take 600 mg by mouth 2 (two) times daily.     Iron, Ferrous Sulfate, 325 (65 Fe) MG TABS Take 65 mg/kg of iron by mouth.     meloxicam (MOBIC) 15 MG tablet Take by mouth.     Multiple Vitamin (MULTIVITAMIN) capsule Take 1 capsule by mouth daily.     potassium chloride (K-DUR,KLOR-CON) 10 MEQ tablet Take 10 mEq by mouth daily.     rosuvastatin (CRESTOR) 40 MG tablet Take 40 mg by mouth daily.     topiramate (TOPAMAX) 50 MG tablet Take 1 tablet by mouth 2 (two) times daily.     No current facility-administered  medications for this visit.    OBJECTIVE: Vitals:   05/11/23 1332  BP: (!) 157/71  Pulse: 69  Resp: 16  Temp: (!) 96.4 F (35.8 C)  SpO2: 98%     Body mass index is 24.8 kg/m.    ECOG FS:0 - Asymptomatic  General: Well-developed, well-nourished, no acute distress. Eyes: Pink conjunctiva, anicteric sclera. HEENT: Normocephalic, moist mucous membranes. Lungs: No audible wheezing or coughing. Heart: Regular rate and rhythm. Abdomen: Soft, nontender, no obvious distention. Musculoskeletal: No edema, cyanosis, or clubbing. Neuro: Alert, answering all questions appropriately. Cranial nerves grossly intact. Skin: No rashes or petechiae noted. Psych: Normal affect.  LAB RESULTS:  Lab Results  Component Value Date   NA 137 06/11/2019   K 3.2 (L) 06/11/2019   CL 105 06/11/2019   CO2 23 06/11/2019   GLUCOSE 110 (H) 06/11/2019   BUN 9 06/11/2019   CREATININE 0.90 05/18/2022   CALCIUM 9.2 06/11/2019   PROT 7.7 06/11/2019   ALBUMIN 4.2 06/11/2019   AST 18 06/11/2019  ALT 17 06/11/2019   ALKPHOS 67 06/11/2019   BILITOT 0.6 06/11/2019   GFRNONAA >60 06/11/2019   GFRAA >60 06/11/2019    Lab Results  Component Value Date   WBC 6.7 05/03/2023   NEUTROABS 4.8 05/03/2023   HGB 13.6 05/03/2023   HCT 42.7 05/03/2023   MCV 96.8 05/03/2023   PLT 187 05/03/2023   Lab Results  Component Value Date   IRON 65 05/03/2023   TIBC 311 05/03/2023   IRONPCTSAT 21 05/03/2023   Lab Results  Component Value Date   FERRITIN 102 05/03/2023     STUDIES: No results found.  ASSESSMENT: Iron deficiency anemia.  PLAN:    Iron deficiency anemia: Patient's hemoglobin and iron stores are now within normal limits and patient remain asymptomatic.  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  Prior to that, upper endoscopy on July 08, 2019 that revealed 1 angiectasia in the duodenum that was treated with argon  plasma coagulation.  Colonoscopy on October 28, 2019 did not reveal any significant pathology.  She does not require additional IV Feraheme today.  Patient last received treatment on February 10, 2023.  Return to clinic in 4 months with repeat laboratory work, further evaluation, and continuation of treatment if needed.  I spent a total of 20 minutes reviewing chart data, face-to-face evaluation with the patient, counseling and coordination of care as detailed above.   Patient expressed understanding and was in agreement with this plan. She also understands that She can call clinic at any time with any questions, concerns, or complaints.    Jeralyn Ruths, MD   05/11/2023 1:57 PM

## 2023-06-01 DIAGNOSIS — M3501 Sicca syndrome with keratoconjunctivitis: Secondary | ICD-10-CM | POA: Diagnosis not present

## 2023-06-01 DIAGNOSIS — H43813 Vitreous degeneration, bilateral: Secondary | ICD-10-CM | POA: Diagnosis not present

## 2023-06-01 DIAGNOSIS — H538 Other visual disturbances: Secondary | ICD-10-CM | POA: Diagnosis not present

## 2023-06-01 DIAGNOSIS — Z961 Presence of intraocular lens: Secondary | ICD-10-CM | POA: Diagnosis not present

## 2023-07-05 ENCOUNTER — Encounter: Payer: Self-pay | Admitting: Oncology

## 2023-07-14 ENCOUNTER — Ambulatory Visit
Admission: RE | Admit: 2023-07-14 | Discharge: 2023-07-14 | Disposition: A | Discharge status: Home / Self Care | Payer: PPO | Source: Ambulatory Visit | Attending: Acute Care | Admitting: Acute Care

## 2023-07-14 DIAGNOSIS — Z122 Encounter for screening for malignant neoplasm of respiratory organs: Secondary | ICD-10-CM | POA: Insufficient documentation

## 2023-07-14 DIAGNOSIS — Z87891 Personal history of nicotine dependence: Secondary | ICD-10-CM | POA: Insufficient documentation

## 2023-07-28 ENCOUNTER — Other Ambulatory Visit: Payer: Self-pay

## 2023-07-28 DIAGNOSIS — Z87891 Personal history of nicotine dependence: Secondary | ICD-10-CM

## 2023-07-28 DIAGNOSIS — Z122 Encounter for screening for malignant neoplasm of respiratory organs: Secondary | ICD-10-CM

## 2023-08-02 DIAGNOSIS — I779 Disorder of arteries and arterioles, unspecified: Secondary | ICD-10-CM | POA: Diagnosis not present

## 2023-08-02 DIAGNOSIS — I700 Atherosclerosis of aorta: Secondary | ICD-10-CM | POA: Diagnosis not present

## 2023-08-02 DIAGNOSIS — I10 Essential (primary) hypertension: Secondary | ICD-10-CM | POA: Diagnosis not present

## 2023-08-09 DIAGNOSIS — J449 Chronic obstructive pulmonary disease, unspecified: Secondary | ICD-10-CM | POA: Diagnosis not present

## 2023-08-09 DIAGNOSIS — I739 Peripheral vascular disease, unspecified: Secondary | ICD-10-CM | POA: Diagnosis not present

## 2023-08-09 DIAGNOSIS — Z23 Encounter for immunization: Secondary | ICD-10-CM | POA: Diagnosis not present

## 2023-08-09 DIAGNOSIS — I779 Disorder of arteries and arterioles, unspecified: Secondary | ICD-10-CM | POA: Diagnosis not present

## 2023-08-09 DIAGNOSIS — I10 Essential (primary) hypertension: Secondary | ICD-10-CM | POA: Diagnosis not present

## 2023-08-09 DIAGNOSIS — I700 Atherosclerosis of aorta: Secondary | ICD-10-CM | POA: Diagnosis not present

## 2023-09-05 ENCOUNTER — Encounter (INDEPENDENT_AMBULATORY_CARE_PROVIDER_SITE_OTHER): Payer: Self-pay | Admitting: Vascular Surgery

## 2023-09-05 ENCOUNTER — Ambulatory Visit (INDEPENDENT_AMBULATORY_CARE_PROVIDER_SITE_OTHER): Payer: PPO

## 2023-09-05 ENCOUNTER — Ambulatory Visit (INDEPENDENT_AMBULATORY_CARE_PROVIDER_SITE_OTHER): Payer: PPO | Admitting: Vascular Surgery

## 2023-09-05 VITALS — BP 144/91 | HR 85 | Resp 16 | Wt 127.5 lb

## 2023-09-05 DIAGNOSIS — I70213 Atherosclerosis of native arteries of extremities with intermittent claudication, bilateral legs: Secondary | ICD-10-CM | POA: Diagnosis not present

## 2023-09-05 DIAGNOSIS — I6521 Occlusion and stenosis of right carotid artery: Secondary | ICD-10-CM | POA: Diagnosis not present

## 2023-09-05 DIAGNOSIS — I6523 Occlusion and stenosis of bilateral carotid arteries: Secondary | ICD-10-CM | POA: Diagnosis not present

## 2023-09-05 DIAGNOSIS — E785 Hyperlipidemia, unspecified: Secondary | ICD-10-CM | POA: Diagnosis not present

## 2023-09-05 DIAGNOSIS — I10 Essential (primary) hypertension: Secondary | ICD-10-CM

## 2023-09-05 NOTE — Assessment & Plan Note (Signed)
Her ABIs today are stable at 0.89 on the right and 1.01 on the left with triphasic waveforms and normal digital pressures bilaterally.  Doing well about 4 years status post aortoiliac intervention for disabling claudication symptoms.  Continue current medical regimen including statin agent and aspirin and Plavix.  Follow-up in 1 year.

## 2023-09-05 NOTE — Assessment & Plan Note (Signed)
Carotid duplex shows a patent right carotid endarterectomy and 1 to 39% left ICA stenosis.  Doing well about 5 years after right carotid endarterectomy.  Continue current medical regimen including dual antiplatelet therapy and statin agent.  Recheck in 1 year.

## 2023-09-05 NOTE — Progress Notes (Signed)
MRN : [id number]  Jasmine Acosta is a 72 y.o. (1951/08/27) female who presents with chief complaint of  Chief Complaint  Patient presents with   Follow-up    24yr carotid and abi follow up  .  History of Present Illness: Patient returns today in follow up of multiple vascular issues.  She is about 5 years status post right carotid endarterectomy in about 4 years status post kissing balloon expandable stent placement in bilateral common iliac arteries.  She is doing well today.  She is walking without limitation.  She has had no focal neurologic symptoms. Specifically, the patient denies amaurosis fugax, speech or swallowing difficulties, or arm or leg weakness or numbness.  Her ABIs today are stable at 0.89 on the right and 1.01 on the left with triphasic waveforms and normal digital pressures bilaterally. Carotid duplex shows a patent right carotid endarterectomy and 1 to 39% left ICA stenosis.   Current Outpatient Medications  Medication Sig Dispense Refill   amLODipine (NORVASC) 5 MG tablet Take 5 mg by mouth daily.   11   aspirin EC 81 MG tablet Take 1 tablet (81 mg total) by mouth daily. 150 tablet 2   clopidogrel (PLAVIX) 75 MG tablet Take 1 tablet (75 mg total) by mouth daily with breakfast. 30 tablet 11   guaiFENesin (MUCINEX) 600 MG 12 hr tablet Take 600 mg by mouth 2 (two) times daily.     Iron, Ferrous Sulfate, 325 (65 Fe) MG TABS Take 65 mg/kg of iron by mouth.     Multiple Vitamin (MULTIVITAMIN) capsule Take 1 capsule by mouth daily.     potassium chloride (K-DUR,KLOR-CON) 10 MEQ tablet Take 10 mEq by mouth daily.     rosuvastatin (CRESTOR) 40 MG tablet Take 40 mg by mouth daily.     topiramate (TOPAMAX) 50 MG tablet Take 1 tablet by mouth 2 (two) times daily.     meloxicam (MOBIC) 15 MG tablet Take by mouth. (Patient not taking: Reported on 09/05/2023)     No current facility-administered medications for this visit.    Past Medical History:  Diagnosis Date   Carotid  artery occlusion    COPD (chronic obstructive pulmonary disease) (HCC) 07/06/2015   Coronary artery disease    Headache    Hypertension    Hyperthyroidism    ablated with iodine   Peripheral vascular disease (HCC)    Bilateral Carotid Artery Disease   Personal history of arterial venous malformation (AVM)    Pseudoclaudication     Past Surgical History:  Procedure Laterality Date   ABDOMINAL HYSTERECTOMY     heavy bleeding   CATARACT EXTRACTION W/PHACO Right 10/09/2018   Procedure: CATARACT EXTRACTION PHACO AND INTRAOCULAR LENS PLACEMENT (IOC) RIGHT;  Surgeon: Lockie Mola, MD;  Location: [facility];  Service: Ophthalmology;  Laterality: Right;   CATARACT EXTRACTION W/PHACO Left 01/01/2019   Procedure: CATARACT EXTRACTION PHACO AND INTRAOCULAR LENS PLACEMENT (IOC)  LEFT;  Surgeon: Lockie Mola, MD;  Location: [facility];  Service: Ophthalmology;  Laterality: Left;   COLONOSCOPY WITH PROPOFOL N/A 10/30/2019   Procedure: COLONOSCOPY WITH PROPOFOL;  Surgeon: Toledo, Boykin Nearing, MD;  Location: [facility];  Service: Gastroenterology;  Laterality: N/A;   CORONARY ANGIOPLASTY     ENDARTERECTOMY Right 06/14/2018   Procedure: ENDARTERECTOMY CAROTID;  Surgeon: Annice Needy, MD;  Location: [facility];  Service: Vascular;  Laterality: Right;   ESOPHAGOGASTRODUODENOSCOPY N/A 07/05/2019   Procedure: ESOPHAGOGASTRODUODENOSCOPY (EGD);  Surgeon: Christena Deem, MD;  Location:  [facility];  Service: Endoscopy;  Laterality: N/A;   ESOPHAGOGASTRODUODENOSCOPY (EGD) WITH PROPOFOL N/A 07/08/2019   Procedure: ESOPHAGOGASTRODUODENOSCOPY (EGD) WITH PROPOFOL;  Surgeon: Christena Deem, MD;  Location: [facility];  Service: Endoscopy;  Laterality: N/A;   EYE SURGERY     hallux vagus correction Right    HALLUX VALGUS AUSTIN Left 07/01/2016   Procedure: HALLUX VALGUS AUSTIN left foot;  Surgeon: Linus Galas, DPM;  Location: [facility];  Service: Podiatry;  Laterality: Left;    LOWER EXTREMITY ANGIOGRAPHY Right 11/05/2018   Procedure: LOWER EXTREMITY ANGIOGRAPHY;  Surgeon: Annice Needy, MD;  Location: [facility];  Service: Cardiovascular;  Laterality: Right;     Social History   Tobacco Use   Smoking status: Former    Current packs/day: 0.00    Average packs/day: 1 pack/day for 50.0 years (50.0 ttl pk-yrs)    Types: Cigarettes    Start date: 06/14/1968    Quit date: 06/14/2018    Years since quitting: 5.2   Smokeless tobacco: Never   Tobacco comments:    since age 52  Vaping Use   Vaping status: Never Used  Substance Use Topics   Alcohol use: No   Drug use: No      Family History  Problem Relation Age of Onset   Diabetes Mother    Emphysema Father    Heart disease Sister    Heart disease Brother    Breast cancer Neg Hx    Ovarian cancer Neg Hx    Colon cancer Neg Hx     Allergies  Allergen Reactions   Lisinopril Other (See Comments)    Other reaction(s): Angioedema Of tongue only   Codeine Nausea And Vomiting    REVIEW OF SYSTEMS (Negative unless checked)   Constitutional: [] Weight loss  [] Fever  [] Chills Cardiac: [] Chest pain   [] Chest pressure   [] Palpitations   [] Shortness of breath when laying flat   [] Shortness of breath at rest   [] Shortness of breath with exertion. Vascular:  [x] Pain in legs with walking   [x] Pain in legs at rest   [] Pain in legs when laying flat   [x] Claudication   [] Pain in feet when walking  [] Pain in feet at rest  [] Pain in feet when laying flat   [] History of DVT   [] Phlebitis   [] Swelling in legs   [] Varicose veins   [] Non-healing ulcers Pulmonary:   [] Uses home oxygen   [] Productive cough   [] Hemoptysis   [] Wheeze  [x] COPD   [] Asthma Neurologic:  [] Dizziness  [] Blackouts   [] Seizures   [] History of stroke   [] History of TIA  [] Aphasia   [] Temporary blindness   [] Dysphagia   [] Weakness or numbness in arms   [] Weakness or numbness in legs Musculoskeletal:  [x] Arthritis   [] Joint swelling   [x] Joint  pain   [] Low back pain Hematologic:  [] Easy bruising  [] Easy bleeding   [] Hypercoagulable state   [] Anemic  [] Hepatitis Gastrointestinal:  [] Blood in stool   [] Vomiting blood  [x] Gastroesophageal reflux/heartburn   [] Abdominal pain Genitourinary:  [] Chronic kidney disease   [] Difficult urination  [] Frequent urination  [] Burning with urination   [] Hematuria Skin:  [] Rashes   [] Ulcers   [] Wounds Psychological:  [] History of anxiety   []  History of major depression.  Physical Examination  BP (!) 144/91 (BP Location: Right Arm)   Pulse 85   Resp 16   Wt 127 lb 7.2 oz (57.8 kg)   BMI 24.89 kg/m  Gen:  WD/WN, NAD Head:  /AT, No temporalis wasting. Ear/Nose/Throat: Hearing grossly intact, nares w/o erythema or drainage Eyes: Conjunctiva clear. Sclera non-icteric Neck: Supple.  Trachea midline Pulmonary:  Good air movement, no use of accessory muscles.  Cardiac: RRR, no JVD Vascular:  Vessel Right Left  Radial Palpable Palpable                          PT Palpable Palpable  DP Palpable Palpable   Gastrointestinal: soft, non-tender/non-distended. No guarding/reflex.  Musculoskeletal: M/S 5/5 throughout.  No deformity or atrophy. No edema. Neurologic: Sensation grossly intact in extremities.  Symmetrical.  Speech is fluent.  Psychiatric: Judgment intact, Mood & affect appropriate for pt's clinical situation. Dermatologic: No rashes or ulcers noted.  No cellulitis or open wounds.      Labs No results found for this or any previous visit (from the past 2160 hour(s)).  Radiology No results found.  Assessment/Plan  Atherosclerosis of native arteries of extremity with intermittent claudication (HCC) Her ABIs today are stable at 0.89 on the right and 1.01 on the left with triphasic waveforms and normal digital pressures bilaterally.  Doing well about 4 years status post aortoiliac intervention for disabling claudication symptoms.  Continue current medical regimen including  statin agent and aspirin and Plavix.  Follow-up in 1 year.  Bilateral carotid artery disease (HCC) Carotid duplex shows a patent right carotid endarterectomy and 1 to 39% left ICA stenosis.  Doing well about 5 years after right carotid endarterectomy.  Continue current medical regimen including dual antiplatelet therapy and statin agent.  Recheck in 1 year.  Essential hypertension blood pressure control important in reducing the progression of atherosclerotic disease. On appropriate oral medications.     Hyperlipidemia lipid control important in reducing the progression of atherosclerotic disease. Continue statin therapy   Festus Barren, MD  09/05/2023 12:02 PM    This note was created with Dragon medical transcription system.  Any errors from dictation are purely unintentional

## 2023-09-08 LAB — VAS US ABI WITH/WO TBI
Left ABI: 1.01
Right ABI: 0.89

## 2023-09-15 ENCOUNTER — Other Ambulatory Visit: Payer: Self-pay | Admitting: *Deleted

## 2023-09-15 DIAGNOSIS — D509 Iron deficiency anemia, unspecified: Secondary | ICD-10-CM

## 2023-09-18 ENCOUNTER — Inpatient Hospital Stay: Payer: PPO | Attending: Oncology

## 2023-09-18 DIAGNOSIS — I10 Essential (primary) hypertension: Secondary | ICD-10-CM | POA: Insufficient documentation

## 2023-09-18 DIAGNOSIS — D509 Iron deficiency anemia, unspecified: Secondary | ICD-10-CM | POA: Insufficient documentation

## 2023-09-18 LAB — CBC WITH DIFFERENTIAL/PLATELET
Abs Immature Granulocytes: 0.01 10*3/uL (ref 0.00–0.07)
Basophils Absolute: 0 10*3/uL (ref 0.0–0.1)
Basophils Relative: 1 %
Eosinophils Absolute: 0.1 10*3/uL (ref 0.0–0.5)
Eosinophils Relative: 2 %
HCT: 39.2 % (ref 36.0–46.0)
Hemoglobin: 12.3 g/dL (ref 12.0–15.0)
Immature Granulocytes: 0 %
Lymphocytes Relative: 24 %
Lymphs Abs: 1.4 10*3/uL (ref 0.7–4.0)
MCH: 30.7 pg (ref 26.0–34.0)
MCHC: 31.4 g/dL (ref 30.0–36.0)
MCV: 97.8 fL (ref 80.0–100.0)
Monocytes Absolute: 0.4 10*3/uL (ref 0.1–1.0)
Monocytes Relative: 6 %
Neutro Abs: 4 10*3/uL (ref 1.7–7.7)
Neutrophils Relative %: 67 %
Platelets: 199 10*3/uL (ref 150–400)
RBC: 4.01 MIL/uL (ref 3.87–5.11)
RDW: 12.2 % (ref 11.5–15.5)
WBC: 5.9 10*3/uL (ref 4.0–10.5)
nRBC: 0 % (ref 0.0–0.2)

## 2023-09-18 LAB — FERRITIN: Ferritin: 45 ng/mL (ref 11–307)

## 2023-09-18 LAB — IRON AND TIBC
Iron: 44 ug/dL (ref 28–170)
Saturation Ratios: 13 % (ref 10.4–31.8)
TIBC: 333 ug/dL (ref 250–450)
UIBC: 289 ug/dL

## 2023-09-19 ENCOUNTER — Inpatient Hospital Stay: Payer: PPO

## 2023-09-19 ENCOUNTER — Inpatient Hospital Stay (HOSPITAL_BASED_OUTPATIENT_CLINIC_OR_DEPARTMENT_OTHER): Payer: PPO | Admitting: Oncology

## 2023-09-19 ENCOUNTER — Encounter: Payer: Self-pay | Admitting: Oncology

## 2023-09-19 VITALS — BP 144/61 | HR 74 | Temp 96.5°F | Resp 16 | Ht 60.0 in | Wt 127.9 lb

## 2023-09-19 DIAGNOSIS — D509 Iron deficiency anemia, unspecified: Secondary | ICD-10-CM

## 2023-09-19 NOTE — Progress Notes (Signed)
[facility]  [phone]) [phone] Fax:(336) [phone]  ID: Jasmine Acosta OB: [birth year]  MR#: [id number]  QVZ#:[id number]  Patient Care Team: Lauro Regulus, MD as PCP - General (Internal Medicine) Jeralyn Ruths, MD as Consulting Physician (Oncology) Stanton Kidney, MD as Consulting Physician (Gastroenterology)  CHIEF COMPLAINT: Iron deficiency anemia.  INTERVAL HISTORY: Patient returns to clinic today for repeat laboratory work, further evaluation, and consideration of additional IV Feraheme.  She continues to feel well and remains asymptomatic.  She does not complain of any weakness or fatigue. She has no neurologic complaints. She denies any recent fevers or illnesses.  She has a good appetite and denies weight loss.  She has no chest pain, shortness of breath, cough, or hemoptysis.  She denies any nausea, vomiting, constipation, or diarrhea.  She has no melena or hematochezia.  She has no urinary complaints.  Patient feels at her baseline and offers no specific complaints today.   REVIEW OF SYSTEMS:   Review of Systems  Constitutional: Negative.  Negative for fever, malaise/fatigue and weight loss.  Respiratory: Negative.  Negative for cough, hemoptysis and shortness of breath.   Cardiovascular: Negative.  Negative for chest pain and leg swelling.  Gastrointestinal: Negative.  Negative for abdominal pain, blood in stool and melena.  Genitourinary: Negative.  Negative for hematuria.  Musculoskeletal: Negative.  Negative for back pain.  Skin: Negative.  Negative for rash.  Neurological: Negative.  Negative for dizziness, focal weakness, weakness and headaches.  Psychiatric/Behavioral: Negative.  The patient is not nervous/anxious.     As per HPI. Otherwise, a complete review of systems is negative.  PAST MEDICAL HISTORY: Past Medical History:  Diagnosis Date   Carotid artery occlusion    COPD (chronic obstructive pulmonary disease) (HCC)  07/06/2015   Coronary artery disease    Headache    Hypertension    Hyperthyroidism    ablated with iodine   Peripheral vascular disease (HCC)    Bilateral Carotid Artery Disease   Personal history of arterial venous malformation (AVM)    Pseudoclaudication     PAST SURGICAL HISTORY: Past Surgical History:  Procedure Laterality Date   ABDOMINAL HYSTERECTOMY     heavy bleeding   CATARACT EXTRACTION W/PHACO Right 10/09/2018   Procedure: CATARACT EXTRACTION PHACO AND INTRAOCULAR LENS PLACEMENT (IOC) RIGHT;  Surgeon: Lockie Mola, MD;  Location: [facility];  Service: Ophthalmology;  Laterality: Right;   CATARACT EXTRACTION W/PHACO Left 01/01/2019   Procedure: CATARACT EXTRACTION PHACO AND INTRAOCULAR LENS PLACEMENT (IOC)  LEFT;  Surgeon: Lockie Mola, MD;  Location: [facility];  Service: Ophthalmology;  Laterality: Left;   COLONOSCOPY WITH PROPOFOL N/A 10/30/2019   Procedure: COLONOSCOPY WITH PROPOFOL;  Surgeon: Toledo, Boykin Nearing, MD;  Location: [facility];  Service: Gastroenterology;  Laterality: N/A;   CORONARY ANGIOPLASTY     ENDARTERECTOMY Right 06/14/2018   Procedure: ENDARTERECTOMY CAROTID;  Surgeon: Annice Needy, MD;  Location: [facility];  Service: Vascular;  Laterality: Right;   ESOPHAGOGASTRODUODENOSCOPY N/A 07/05/2019   Procedure: ESOPHAGOGASTRODUODENOSCOPY (EGD);  Surgeon: Christena Deem, MD;  Location: [facility];  Service: Endoscopy;  Laterality: N/A;   ESOPHAGOGASTRODUODENOSCOPY (EGD) WITH PROPOFOL N/A 07/08/2019   Procedure: ESOPHAGOGASTRODUODENOSCOPY (EGD) WITH PROPOFOL;  Surgeon: Christena Deem, MD;  Location: [facility];  Service: Endoscopy;  Laterality: N/A;   EYE SURGERY     hallux vagus correction Right    HALLUX VALGUS AUSTIN Left 07/01/2016   Procedure: HALLUX VALGUS AUSTIN left foot;  Surgeon: Linus Galas,  DPM;  Location: [facility];  Service: Podiatry;  Laterality: Left;   LOWER EXTREMITY ANGIOGRAPHY Right 11/05/2018    Procedure: LOWER EXTREMITY ANGIOGRAPHY;  Surgeon: Annice Needy, MD;  Location: [facility];  Service: Cardiovascular;  Laterality: Right;    FAMILY HISTORY: Family History  Problem Relation Age of Onset   Diabetes Mother    Emphysema Father    Heart disease Sister    Heart disease Brother    Breast cancer Neg Hx    Ovarian cancer Neg Hx    Colon cancer Neg Hx     ADVANCED DIRECTIVES (Y/N):  N  HEALTH MAINTENANCE: Social History   Tobacco Use   Smoking status: Former    Current packs/day: 0.00    Average packs/day: 1 pack/day for 50.0 years (50.0 ttl pk-yrs)    Types: Cigarettes    Start date: 06/14/1968    Quit date: 06/14/2018    Years since quitting: 5.2   Smokeless tobacco: Never   Tobacco comments:    since age 1  Vaping Use   Vaping status: Never Used  Substance Use Topics   Alcohol use: No   Drug use: No     Colonoscopy:  PAP:  Bone density:  Lipid panel:  Allergies  Allergen Reactions   Lisinopril Other (See Comments)    Other reaction(s): Angioedema Of tongue only   Codeine Nausea And Vomiting    Current Outpatient Medications  Medication Sig Dispense Refill   amLODipine (NORVASC) 5 MG tablet Take 5 mg by mouth daily.   11   aspirin EC 81 MG tablet Take 1 tablet (81 mg total) by mouth daily. 150 tablet 2   clopidogrel (PLAVIX) 75 MG tablet Take 1 tablet (75 mg total) by mouth daily with breakfast. 30 tablet 11   guaiFENesin (MUCINEX) 600 MG 12 hr tablet Take 600 mg by mouth 2 (two) times daily.     Iron, Ferrous Sulfate, 325 (65 Fe) MG TABS Take 65 mg/kg of iron by mouth.     Multiple Vitamin (MULTIVITAMIN) capsule Take 1 capsule by mouth daily.     potassium chloride (K-DUR,KLOR-CON) 10 MEQ tablet Take 10 mEq by mouth daily.     rosuvastatin (CRESTOR) 40 MG tablet Take 40 mg by mouth daily.     topiramate (TOPAMAX) 50 MG tablet Take 1 tablet by mouth 2 (two) times daily.     meloxicam (MOBIC) 15 MG tablet Take by mouth. (Patient not  taking: Reported on 09/05/2023)     No current facility-administered medications for this visit.    OBJECTIVE: Vitals:   09/19/23 1351  BP: (!) 144/61  Pulse: 74  Resp: 16  Temp: (!) 96.5 F (35.8 C)  SpO2: 98%      Body mass index is 24.98 kg/m.    ECOG FS:0 - Asymptomatic  General: Well-developed, well-nourished, no acute distress. Eyes: Pink conjunctiva, anicteric sclera. HEENT: Normocephalic, moist mucous membranes. Lungs: No audible wheezing or coughing. Heart: Regular rate and rhythm. Abdomen: Soft, nontender, no obvious distention. Musculoskeletal: No edema, cyanosis, or clubbing. Neuro: Alert, answering all questions appropriately. Cranial nerves grossly intact. Skin: No rashes or petechiae noted. Psych: Normal affect.  LAB RESULTS:  Lab Results  Component Value Date   NA 137 06/11/2019   K 3.2 (L) 06/11/2019   CL 105 06/11/2019   CO2 23 06/11/2019   GLUCOSE 110 (H) 06/11/2019   BUN 9 06/11/2019   CREATININE 0.90 05/18/2022   CALCIUM 9.2 06/11/2019   PROT 7.7  06/11/2019   ALBUMIN 4.2 06/11/2019   AST 18 06/11/2019   ALT 17 06/11/2019   ALKPHOS 67 06/11/2019   BILITOT 0.6 06/11/2019   GFRNONAA >60 06/11/2019   GFRAA >60 06/11/2019    Lab Results  Component Value Date   WBC 5.9 09/18/2023   NEUTROABS 4.0 09/18/2023   HGB 12.3 09/18/2023   HCT 39.2 09/18/2023   MCV 97.8 09/18/2023   PLT 199 09/18/2023   Lab Results  Component Value Date   IRON 44 09/18/2023   TIBC 333 09/18/2023   IRONPCTSAT 13 09/18/2023   Lab Results  Component Value Date   FERRITIN 45 09/18/2023     STUDIES: VAS US CAROTID  Result Date: 09/08/2023 Carotid Arterial Duplex Study Patient Name:  Jasmine Acosta  Date of Exam:   09/05/2023 Medical Rec #: [id number]        Accession #:    [id number] Date of Birth: 11/20/1950         Patient Gender: F Patient Age:   72 years Exam Location:  [facility] Procedure:      VAS US CAROTID Referring Phys: Festus Barren  --------------------------------------------------------------------------------  Indications:       Carotid artery disease. Other Factors:     Rt CEA 05/2018. Comparison Study:  08/09/2021 Performing Technologist: Debbe Bales RVS  Examination Guidelines: A complete evaluation includes B-mode imaging, spectral Doppler, color Doppler, and power Doppler as needed of all accessible portions of each vessel. Bilateral testing is considered an integral part of a complete examination. Limited examinations for reoccurring indications may be performed as noted.  Right Carotid Findings: +---------+--------+-------+--------+---------------------------------+--------+          PSV cm/sEDV    StenosisPlaque Description               Comments                  cm/s                                                     +---------+--------+-------+--------+---------------------------------+--------+ CCA Prox 73      18                                                       +---------+--------+-------+--------+---------------------------------+--------+ CCA Mid  74      16             heterogenous, irregular and                                               calcific                                  +---------+--------+-------+--------+---------------------------------+--------+ CCA      94      17  Distal                                                                    +---------+--------+-------+--------+---------------------------------+--------+ ICA Prox 38      12                                                       +---------+--------+-------+--------+---------------------------------+--------+ ICA Mid  59      18                                                       +---------+--------+-------+--------+---------------------------------+--------+ ICA      71      16                                                        Distal                                                                    +---------+--------+-------+--------+---------------------------------+--------+ ECA      42      0                                                        +---------+--------+-------+--------+---------------------------------+--------+ +----------+--------+-------+--------+-------------------+           PSV cm/sEDV cmsDescribeArm Pressure (mmHG) +----------+--------+-------+--------+-------------------+ Subclavian101     0                                  +----------+--------+-------+--------+-------------------+ +---------+--------+--+--------+--+ VertebralPSV cm/s63EDV cm/s13 +---------+--------+--+--------+--+  Left Carotid Findings: +---------+--------+-------+--------+---------------------------------+--------+          PSV cm/sEDV    StenosisPlaque Description               Comments                  cm/s                                                     +---------+--------+-------+--------+---------------------------------+--------+ CCA Prox 92      22                                                       +---------+--------+-------+--------+---------------------------------+--------+  CCA Mid  83      18             heterogenous, irregular and                                               calcific                                  +---------+--------+-------+--------+---------------------------------+--------+ CCA      68      19                                                       Distal                                                                    +---------+--------+-------+--------+---------------------------------+--------+ ICA Prox 62      13             irregular, calcific and                                                   heterogenous                               +---------+--------+-------+--------+---------------------------------+--------+ ICA Mid  86      21                                                       +---------+--------+-------+--------+---------------------------------+--------+ ICA      77      20                                                       Distal                                                                    +---------+--------+-------+--------+---------------------------------+--------+ ECA      128     10                                                       +---------+--------+-------+--------+---------------------------------+--------+ +----------+--------+--------+--------+-------------------+  PSV cm/sEDV cm/sDescribeArm Pressure (mmHG) +----------+--------+--------+--------+-------------------+ Subclavian123     11                                  +----------+--------+--------+--------+-------------------+ +---------+--------+--+--------+--+ VertebralPSV cm/s40EDV cm/s12 +---------+--------+--+--------+--+   Summary: Right Carotid: Velocities in the right ICA are consistent with a 1-39% stenosis. Left Carotid: Velocities in the left ICA are consistent with a 1-39% stenosis. Vertebrals:  Bilateral vertebral arteries demonstrate antegrade flow. Subclavians: Normal flow hemodynamics were seen in bilateral subclavian              arteries. *See table(s) above for measurements and observations.  Electronically signed by Festus Barren MD on 09/08/2023 at 7:30:11 AM.    Final    VAS [place] ABI WITH/WO TBI  Result Date: 09/08/2023  LOWER EXTREMITY DOPPLER STUDY Patient Name:  Jasmine Acosta  Date of Exam:   09/05/2023 Medical Rec #: [id number]        Accession #:    [id number] Date of Birth: [birth year]         Patient Gender: F Patient Age:   72 years Exam Location:  [facility] Procedure:      VAS [place] ABI WITH/WO TBI Referring Phys: Festus Barren  --------------------------------------------------------------------------------  Indications: Peripheral artery disease.  Vascular Interventions: 11/05/18: Bilateral CIA stents;. Comparison Study: 09/06/2022 Performing Technologist: Debbe Bales RVS  Examination Guidelines: A complete evaluation includes at minimum, Doppler waveform signals and systolic blood pressure reading at the level of bilateral brachial, anterior tibial, and posterior tibial arteries, when vessel segments are accessible. Bilateral testing is considered an integral part of a complete examination. Photoelectric Plethysmograph (PPG) waveforms and toe systolic pressure readings are included as required and additional duplex testing as needed. Limited examinations for reoccurring indications may be performed as noted.  ABI Findings: +---------+------------------+-----+---------+--------+ Right    Rt Pressure (mmHg)IndexWaveform Comment  +---------+------------------+-----+---------+--------+ Brachial 127                                      +---------+------------------+-----+---------+--------+ ATA      110               0.83 biphasic          +---------+------------------+-----+---------+--------+ PTA      118               0.89 triphasic         +---------+------------------+-----+---------+--------+ Great Toe127               0.96 Normal            +---------+------------------+-----+---------+--------+ +---------+------------------+-----+---------+-------+ Left     Lt Pressure (mmHg)IndexWaveform Comment +---------+------------------+-----+---------+-------+ Brachial 132                                     +---------+------------------+-----+---------+-------+ ATA      106               0.80 biphasic         +---------+------------------+-----+---------+-------+ PTA      133               1.01 triphasic        +---------+------------------+-----+---------+-------+ Great Toe119                0.90  Normal           +---------+------------------+-----+---------+-------+ +-------+-----------+-----------+------------+------------+ ABI/TBIToday's ABIToday's TBIPrevious ABIPrevious TBI +-------+-----------+-----------+------------+------------+ Right  .89        .96        .88         .80          +-------+-----------+-----------+------------+------------+ Left   1.01       .90        .96         .93          +-------+-----------+-----------+------------+------------+  Bilateral ABIs appear essentially unchanged compared to prior study on 09/07/2023. Left TBIs appear essentially unchanged compared to prior study on 09/06/2022. Right TBIs appear to be increased compared to prior study on 09/06/2022.  Summary: Right: Resting right ankle-brachial index indicates mild right lower extremity arterial disease. The right toe-brachial index is normal. Left: Resting left ankle-brachial index is within normal range. The left toe-brachial index is normal. *See table(s) above for measurements and observations.  Electronically signed by Festus Barren MD on 09/08/2023 at 7:29:57 AM.    Final     ASSESSMENT: Iron deficiency anemia.  PLAN:    Iron deficiency anemia: Patient's hemoglobin iron stores continue to be within normal limits.  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  Prior to that, upper endoscopy on July 08, 2019 that revealed 1 angiectasia in the duodenum that was treated with argon plasma coagulation.  Colonoscopy on October 28, 2019 did not reveal any significant pathology.  Patient does not require additional Feraheme today.  She last received treatment on February 10, 2023.  Return to clinic in 6 months with repeat laboratory work, further evaluation, and continuation of treatment if needed.  If patient does not require IV iron at that time, she likely can be discharged from clinic. Hypertension:  Patient's blood pressure is moderately elevated.  Continue monitoring and treatment per primary care.  Patient expressed understanding and was in agreement with this plan. She also understands that She can call clinic at any time with any questions, concerns, or complaints.    Jeralyn Ruths, MD   09/19/2023 2:17 PM

## 2023-10-23 ENCOUNTER — Other Ambulatory Visit: Payer: Self-pay | Admitting: Internal Medicine

## 2023-10-23 DIAGNOSIS — Z1231 Encounter for screening mammogram for malignant neoplasm of breast: Secondary | ICD-10-CM

## 2023-10-31 ENCOUNTER — Ambulatory Visit
Admission: RE | Admit: 2023-10-31 | Discharge: 2023-10-31 | Disposition: A | Discharge status: Home / Self Care | Payer: PPO | Source: Ambulatory Visit | Attending: Internal Medicine | Admitting: Internal Medicine

## 2023-10-31 DIAGNOSIS — Z1231 Encounter for screening mammogram for malignant neoplasm of breast: Secondary | ICD-10-CM | POA: Insufficient documentation

## 2024-02-06 DIAGNOSIS — I779 Disorder of arteries and arterioles, unspecified: Secondary | ICD-10-CM | POA: Diagnosis not present

## 2024-02-06 DIAGNOSIS — I10 Essential (primary) hypertension: Secondary | ICD-10-CM | POA: Diagnosis not present

## 2024-02-06 DIAGNOSIS — I700 Atherosclerosis of aorta: Secondary | ICD-10-CM | POA: Diagnosis not present

## 2024-02-13 DIAGNOSIS — I700 Atherosclerosis of aorta: Secondary | ICD-10-CM | POA: Diagnosis not present

## 2024-02-13 DIAGNOSIS — G43709 Chronic migraine without aura, not intractable, without status migrainosus: Secondary | ICD-10-CM | POA: Diagnosis not present

## 2024-02-13 DIAGNOSIS — I739 Peripheral vascular disease, unspecified: Secondary | ICD-10-CM | POA: Diagnosis not present

## 2024-02-13 DIAGNOSIS — Z0000 Encounter for general adult medical examination without abnormal findings: Secondary | ICD-10-CM | POA: Diagnosis not present

## 2024-02-13 DIAGNOSIS — I779 Disorder of arteries and arterioles, unspecified: Secondary | ICD-10-CM | POA: Diagnosis not present

## 2024-02-13 DIAGNOSIS — J449 Chronic obstructive pulmonary disease, unspecified: Secondary | ICD-10-CM | POA: Diagnosis not present

## 2024-02-13 DIAGNOSIS — I10 Essential (primary) hypertension: Secondary | ICD-10-CM | POA: Diagnosis not present

## 2024-03-12 ENCOUNTER — Encounter (INDEPENDENT_AMBULATORY_CARE_PROVIDER_SITE_OTHER): Payer: Self-pay

## 2024-03-14 ENCOUNTER — Other Ambulatory Visit: Payer: Self-pay | Admitting: *Deleted

## 2024-03-14 DIAGNOSIS — D509 Iron deficiency anemia, unspecified: Secondary | ICD-10-CM

## 2024-03-14 NOTE — Progress Notes (Signed)
 c

## 2024-03-15 ENCOUNTER — Inpatient Hospital Stay: Payer: PPO | Attending: Oncology

## 2024-03-19 ENCOUNTER — Inpatient Hospital Stay: Payer: PPO

## 2024-03-19 ENCOUNTER — Inpatient Hospital Stay: Payer: PPO | Admitting: Oncology

## 2024-03-25 ENCOUNTER — Inpatient Hospital Stay: Attending: Oncology

## 2024-03-25 DIAGNOSIS — I10 Essential (primary) hypertension: Secondary | ICD-10-CM | POA: Insufficient documentation

## 2024-03-25 DIAGNOSIS — D509 Iron deficiency anemia, unspecified: Secondary | ICD-10-CM | POA: Diagnosis not present

## 2024-03-25 LAB — CBC WITH DIFFERENTIAL/PLATELET
Abs Immature Granulocytes: 0.02 10*3/uL (ref 0.00–0.07)
Basophils Absolute: 0 10*3/uL (ref 0.0–0.1)
Basophils Relative: 0 %
Eosinophils Absolute: 0.1 10*3/uL (ref 0.0–0.5)
Eosinophils Relative: 1 %
HCT: 40.2 % (ref 36.0–46.0)
Hemoglobin: 12.7 g/dL (ref 12.0–15.0)
Immature Granulocytes: 0 %
Lymphocytes Relative: 20 %
Lymphs Abs: 1.4 10*3/uL (ref 0.7–4.0)
MCH: 29.5 pg (ref 26.0–34.0)
MCHC: 31.6 g/dL (ref 30.0–36.0)
MCV: 93.5 fL (ref 80.0–100.0)
Monocytes Absolute: 0.5 10*3/uL (ref 0.1–1.0)
Monocytes Relative: 6 %
Neutro Abs: 5.2 10*3/uL (ref 1.7–7.7)
Neutrophils Relative %: 73 %
Platelets: 196 10*3/uL (ref 150–400)
RBC: 4.3 MIL/uL (ref 3.87–5.11)
RDW: 12 % (ref 11.5–15.5)
WBC: 7.2 10*3/uL (ref 4.0–10.5)
nRBC: 0 % (ref 0.0–0.2)

## 2024-03-25 LAB — IRON AND TIBC
Iron: 50 ug/dL (ref 28–170)
Saturation Ratios: 17 % (ref 10.4–31.8)
TIBC: 298 ug/dL (ref 250–450)
UIBC: 248 ug/dL

## 2024-03-25 LAB — FERRITIN: Ferritin: 64 ng/mL (ref 11–307)

## 2024-03-27 ENCOUNTER — Inpatient Hospital Stay (HOSPITAL_BASED_OUTPATIENT_CLINIC_OR_DEPARTMENT_OTHER): Admitting: Oncology

## 2024-03-27 ENCOUNTER — Encounter: Payer: Self-pay | Admitting: Oncology

## 2024-03-27 ENCOUNTER — Inpatient Hospital Stay

## 2024-03-27 VITALS — BP 136/69 | HR 80 | Temp 97.4°F | Resp 16 | Ht 60.0 in | Wt 127.3 lb

## 2024-03-27 DIAGNOSIS — D509 Iron deficiency anemia, unspecified: Secondary | ICD-10-CM

## 2024-03-27 NOTE — Progress Notes (Signed)
No Venofer today per MD 

## 2024-03-27 NOTE — Progress Notes (Signed)
 [facility]  Telephone:([phone] Fax:([phone]) [phone]  ID: Jasmine Acosta OB: 09-28-1951  MR#: [id number]  JOA#:[id number]  Patient Care Team: Jimmy Moulding, MD as PCP - General (Internal Medicine) Shellie Dials, MD as Consulting Physician (Oncology) Cassie Click, MD as Consulting Physician (Gastroenterology)  CHIEF COMPLAINT: Iron deficiency anemia.  INTERVAL HISTORY: Patient returns to clinic today for repeat laboratory work, further evaluation, and consideration of additional IV Venofer.  She continues to feel well and remains asymptomatic.  She does not complain of any weakness or fatigue.  She has no neurologic complaints. She denies any recent fevers or illnesses.  She has a good appetite and denies weight loss.  She has no chest pain, shortness of breath, cough, or hemoptysis.  She denies any nausea, vomiting, constipation, or diarrhea.  She has no melena or hematochezia.  She has no urinary complaints.  Patient offers no specific complaints today.  REVIEW OF SYSTEMS:   Review of Systems  Constitutional: Negative.  Negative for fever, malaise/fatigue and weight loss.  Respiratory: Negative.  Negative for cough, hemoptysis and shortness of breath.   Cardiovascular: Negative.  Negative for chest pain and leg swelling.  Gastrointestinal: Negative.  Negative for abdominal pain, blood in stool and melena.  Genitourinary: Negative.  Negative for hematuria.  Musculoskeletal: Negative.  Negative for back pain.  Skin: Negative.  Negative for rash.  Neurological: Negative.  Negative for dizziness, focal weakness, weakness and headaches.  Psychiatric/Behavioral: Negative.  The patient is not nervous/anxious.     As per HPI. Otherwise, a complete review of systems is negative.  PAST MEDICAL HISTORY: Past Medical History:  Diagnosis Date   Carotid artery occlusion    COPD (chronic obstructive pulmonary disease) (HCC) 07/06/2015   Coronary artery  disease    Headache    Hypertension    Hyperthyroidism    ablated with iodine   Peripheral vascular disease (HCC)    Bilateral Carotid Artery Disease   Personal history of arterial venous malformation (AVM)    Pseudoclaudication     PAST SURGICAL HISTORY: Past Surgical History:  Procedure Laterality Date   ABDOMINAL HYSTERECTOMY     heavy bleeding   CATARACT EXTRACTION W/PHACO Right 10/09/2018   Procedure: CATARACT EXTRACTION PHACO AND INTRAOCULAR LENS PLACEMENT (IOC) RIGHT;  Surgeon: Annell Kidney, MD;  Location: [facility];  Service: Ophthalmology;  Laterality: Right;   CATARACT EXTRACTION W/PHACO Left 01/01/2019   Procedure: CATARACT EXTRACTION PHACO AND INTRAOCULAR LENS PLACEMENT (IOC)  LEFT;  Surgeon: Annell Kidney, MD;  Location: [facility];  Service: Ophthalmology;  Laterality: Left;   COLONOSCOPY WITH PROPOFOL  N/A 10/30/2019   Procedure: COLONOSCOPY WITH PROPOFOL ;  Surgeon: Toledo, Alphonsus Jeans, MD;  Location: [facility] ENDOSCOPY;  Service: Gastroenterology;  Laterality: N/A;   CORONARY ANGIOPLASTY     ENDARTERECTOMY Right 06/14/2018   Procedure: ENDARTERECTOMY CAROTID;  Surgeon: Celso College, MD;  Location: [facility] ORS;  Service: Vascular;  Laterality: Right;   ESOPHAGOGASTRODUODENOSCOPY N/A 07/05/2019   Procedure: ESOPHAGOGASTRODUODENOSCOPY (EGD);  Surgeon: Deveron Fly, MD;  Location: [facility] ENDOSCOPY;  Service: Endoscopy;  Laterality: N/A;   ESOPHAGOGASTRODUODENOSCOPY (EGD) WITH PROPOFOL  N/A 07/08/2019   Procedure: ESOPHAGOGASTRODUODENOSCOPY (EGD) WITH PROPOFOL ;  Surgeon: Deveron Fly, MD;  Location: [facility] ENDOSCOPY;  Service: Endoscopy;  Laterality: N/A;   EYE SURGERY     hallux vagus correction Right    HALLUX VALGUS AUSTIN Left 07/01/2016   Procedure: HALLUX VALGUS AUSTIN left foot;  Surgeon: Angel Barba, DPM;  Location: [facility] ORS;  Service: Podiatry;  Laterality: Left;   LOWER EXTREMITY ANGIOGRAPHY Right 11/05/2018   Procedure: LOWER EXTREMITY  ANGIOGRAPHY;  Surgeon: Celso College, MD;  Location: [facility] INVASIVE CV LAB;  Service: Cardiovascular;  Laterality: Right;    FAMILY HISTORY: Family History  Problem Relation Age of Onset   Diabetes Mother    Emphysema Father    Heart disease Sister    Heart disease Brother    Breast cancer Neg Hx    Ovarian cancer Neg Hx    Colon cancer Neg Hx     ADVANCED DIRECTIVES (Y/N):  N  HEALTH MAINTENANCE: Social History   Tobacco Use   Smoking status: Former    Current packs/day: 0.00    Average packs/day: 1 pack/day for 50.0 years (50.0 ttl pk-yrs)    Types: Cigarettes    Start date: 06/14/1968    Quit date: 06/14/2018    Years since quitting: 5.7   Smokeless tobacco: Never   Tobacco comments:    since age 53  Vaping Use   Vaping status: Never Used  Substance Use Topics   Alcohol use: No   Drug use: No     Colonoscopy:  PAP:  Bone density:  Lipid panel:  Allergies  Allergen Reactions   Lisinopril Other (See Comments)    Other reaction(s): Angioedema Of tongue only   Codeine Nausea And Vomiting    Current Outpatient Medications  Medication Sig Dispense Refill   amLODipine  (NORVASC ) 5 MG tablet Take 5 mg by mouth daily.   11   aspirin  EC 81 MG tablet Take 1 tablet (81 mg total) by mouth daily. 150 tablet 2   clopidogrel  (PLAVIX ) 75 MG tablet Take 1 tablet (75 mg total) by mouth daily with breakfast. 30 tablet 11   guaiFENesin  (MUCINEX ) 600 MG 12 hr tablet Take 600 mg by mouth 2 (two) times daily.     Iron, Ferrous Sulfate, 325 (65 Fe) MG TABS Take 65 mg/kg of iron by mouth.     Multiple Vitamin (MULTIVITAMIN) capsule Take 1 capsule by mouth daily.     potassium chloride  (K-DUR,KLOR-CON ) 10 MEQ tablet Take 10 mEq by mouth daily.     rosuvastatin  (CRESTOR ) 40 MG tablet Take 40 mg by mouth daily.     topiramate (TOPAMAX) 50 MG tablet Take 1 tablet by mouth 2 (two) times daily.     meloxicam (MOBIC) 15 MG tablet Take by mouth. (Patient not taking: Reported on 03/27/2024)      No current facility-administered medications for this visit.    OBJECTIVE: Vitals:   03/27/24 1409  BP: 136/69  Pulse: 80  Resp: 16  Temp: (!) 97.4 F (36.3 C)  SpO2: 99%      Body mass index is 24.86 kg/m.    ECOG FS:0 - Asymptomatic  General: Well-developed, well-nourished, no acute distress. Eyes: Pink conjunctiva, anicteric sclera. HEENT: Normocephalic, moist mucous membranes. Lungs: No audible wheezing or coughing. Heart: Regular rate and rhythm. Abdomen: Soft, nontender, no obvious distention. Musculoskeletal: No edema, cyanosis, or clubbing. Neuro: Alert, answering all questions appropriately. Cranial nerves grossly intact. Skin: No rashes or petechiae noted. Psych: Normal affect.  LAB RESULTS:  Lab Results  Component Value Date   NA 137 06/11/2019   K 3.2 (L) 06/11/2019   CL 105 06/11/2019   CO2 23 06/11/2019   GLUCOSE 110 (H) 06/11/2019   BUN 9 06/11/2019   CREATININE 0.90 05/18/2022   CALCIUM  9.2 06/11/2019   PROT 7.7 06/11/2019   ALBUMIN 4.2 06/11/2019  AST 18 06/11/2019   ALT 17 06/11/2019   ALKPHOS 67 06/11/2019   BILITOT 0.6 06/11/2019   GFRNONAA >60 06/11/2019   GFRAA >60 06/11/2019    Lab Results  Component Value Date   WBC 7.2 03/25/2024   NEUTROABS 5.2 03/25/2024   HGB 12.7 03/25/2024   HCT 40.2 03/25/2024   MCV 93.5 03/25/2024   PLT 196 03/25/2024   Lab Results  Component Value Date   IRON 50 03/25/2024   TIBC 298 03/25/2024   IRONPCTSAT 17 03/25/2024   Lab Results  Component Value Date   FERRITIN 64 03/25/2024     STUDIES: No results found.  ASSESSMENT: Iron deficiency anemia.  PLAN:    Iron deficiency anemia: Patient's hemoglobin and iron stores continued to be within normal limits. 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  Prior to that, upper endoscopy on July 08, 2019 that revealed 1 angiectasia in the duodenum that was treated  with argon plasma coagulation.  Colonoscopy on October 28, 2019 did not reveal any significant pathology.  Patient does not require additional IV Feraheme  today.  She last received treatment on February 10, 2023.  After discussion with the patient, is agreed upon that no further follow-up is necessary.  Please refer patient back if there are any questions or concerns.   Hypertension: Patient's blood pressure is within normal limits today.  I spent a total of 20 minutes reviewing chart data, face-to-face evaluation with the patient, counseling and coordination of care as detailed above.  Patient expressed understanding and was in agreement with this plan. She also understands that She can call clinic at any time with any questions, concerns, or complaints.    Shellie Dials, MD   03/27/2024 2:23 PM

## 2024-06-03 DIAGNOSIS — H43813 Vitreous degeneration, bilateral: Secondary | ICD-10-CM | POA: Diagnosis not present

## 2024-06-03 DIAGNOSIS — Z961 Presence of intraocular lens: Secondary | ICD-10-CM | POA: Diagnosis not present

## 2024-06-03 DIAGNOSIS — M3501 Sicca syndrome with keratoconjunctivitis: Secondary | ICD-10-CM | POA: Diagnosis not present

## 2024-07-15 ENCOUNTER — Ambulatory Visit
Admission: RE | Admit: 2024-07-15 | Discharge: 2024-07-15 | Disposition: A | Discharge status: Home / Self Care | Source: Ambulatory Visit | Attending: Acute Care | Admitting: Acute Care

## 2024-07-15 DIAGNOSIS — Z122 Encounter for screening for malignant neoplasm of respiratory organs: Secondary | ICD-10-CM | POA: Insufficient documentation

## 2024-07-15 DIAGNOSIS — Z87891 Personal history of nicotine dependence: Secondary | ICD-10-CM | POA: Diagnosis not present

## 2024-07-23 ENCOUNTER — Other Ambulatory Visit: Payer: Self-pay

## 2024-07-23 DIAGNOSIS — Z87891 Personal history of nicotine dependence: Secondary | ICD-10-CM

## 2024-07-23 DIAGNOSIS — Z122 Encounter for screening for malignant neoplasm of respiratory organs: Secondary | ICD-10-CM

## 2024-08-07 DIAGNOSIS — I10 Essential (primary) hypertension: Secondary | ICD-10-CM | POA: Diagnosis not present

## 2024-08-07 DIAGNOSIS — I739 Peripheral vascular disease, unspecified: Secondary | ICD-10-CM | POA: Diagnosis not present

## 2024-08-07 DIAGNOSIS — I779 Disorder of arteries and arterioles, unspecified: Secondary | ICD-10-CM | POA: Diagnosis not present

## 2024-08-14 DIAGNOSIS — G43709 Chronic migraine without aura, not intractable, without status migrainosus: Secondary | ICD-10-CM | POA: Diagnosis not present

## 2024-08-14 DIAGNOSIS — I10 Essential (primary) hypertension: Secondary | ICD-10-CM | POA: Diagnosis not present

## 2024-08-14 DIAGNOSIS — J449 Chronic obstructive pulmonary disease, unspecified: Secondary | ICD-10-CM | POA: Diagnosis not present

## 2024-08-14 DIAGNOSIS — I779 Disorder of arteries and arterioles, unspecified: Secondary | ICD-10-CM | POA: Diagnosis not present

## 2024-08-14 DIAGNOSIS — Z23 Encounter for immunization: Secondary | ICD-10-CM | POA: Diagnosis not present

## 2024-08-14 DIAGNOSIS — R799 Abnormal finding of blood chemistry, unspecified: Secondary | ICD-10-CM | POA: Diagnosis not present

## 2024-09-03 ENCOUNTER — Observation Stay
Admission: EM | Admit: 2024-09-03 | Discharge: 2024-09-05 | Disposition: A | Discharge status: Home / Self Care | Attending: Gastroenterology | Admitting: Gastroenterology

## 2024-09-03 ENCOUNTER — Encounter (INDEPENDENT_AMBULATORY_CARE_PROVIDER_SITE_OTHER): Payer: PPO

## 2024-09-03 ENCOUNTER — Other Ambulatory Visit: Payer: Self-pay

## 2024-09-03 ENCOUNTER — Emergency Department

## 2024-09-03 ENCOUNTER — Encounter: Payer: Self-pay | Admitting: Emergency Medicine

## 2024-09-03 ENCOUNTER — Ambulatory Visit (INDEPENDENT_AMBULATORY_CARE_PROVIDER_SITE_OTHER): Payer: PPO | Admitting: Vascular Surgery

## 2024-09-03 DIAGNOSIS — Z90710 Acquired absence of both cervix and uterus: Secondary | ICD-10-CM | POA: Diagnosis not present

## 2024-09-03 DIAGNOSIS — E039 Hypothyroidism, unspecified: Secondary | ICD-10-CM | POA: Diagnosis not present

## 2024-09-03 DIAGNOSIS — R1011 Right upper quadrant pain: Secondary | ICD-10-CM | POA: Diagnosis not present

## 2024-09-03 DIAGNOSIS — Z87891 Personal history of nicotine dependence: Secondary | ICD-10-CM | POA: Diagnosis not present

## 2024-09-03 DIAGNOSIS — G43909 Migraine, unspecified, not intractable, without status migrainosus: Secondary | ICD-10-CM | POA: Insufficient documentation

## 2024-09-03 DIAGNOSIS — Z7982 Long term (current) use of aspirin: Secondary | ICD-10-CM | POA: Insufficient documentation

## 2024-09-03 DIAGNOSIS — D519 Vitamin B12 deficiency anemia, unspecified: Secondary | ICD-10-CM | POA: Insufficient documentation

## 2024-09-03 DIAGNOSIS — R1010 Upper abdominal pain, unspecified: Principal | ICD-10-CM

## 2024-09-03 DIAGNOSIS — F1220 Cannabis dependence, uncomplicated: Secondary | ICD-10-CM | POA: Insufficient documentation

## 2024-09-03 DIAGNOSIS — I70219 Atherosclerosis of native arteries of extremities with intermittent claudication, unspecified extremity: Secondary | ICD-10-CM | POA: Diagnosis not present

## 2024-09-03 DIAGNOSIS — J449 Chronic obstructive pulmonary disease, unspecified: Secondary | ICD-10-CM | POA: Diagnosis not present

## 2024-09-03 DIAGNOSIS — I2510 Atherosclerotic heart disease of native coronary artery without angina pectoris: Secondary | ICD-10-CM | POA: Diagnosis not present

## 2024-09-03 DIAGNOSIS — I10 Essential (primary) hypertension: Secondary | ICD-10-CM | POA: Insufficient documentation

## 2024-09-03 DIAGNOSIS — R1084 Generalized abdominal pain: Secondary | ICD-10-CM | POA: Diagnosis not present

## 2024-09-03 DIAGNOSIS — Z79899 Other long term (current) drug therapy: Secondary | ICD-10-CM | POA: Diagnosis not present

## 2024-09-03 DIAGNOSIS — I6521 Occlusion and stenosis of right carotid artery: Secondary | ICD-10-CM | POA: Diagnosis present

## 2024-09-03 DIAGNOSIS — R109 Unspecified abdominal pain: Principal | ICD-10-CM | POA: Insufficient documentation

## 2024-09-03 LAB — CBC
HCT: 45.3 % (ref 36.0–46.0)
Hemoglobin: 13.5 g/dL (ref 12.0–15.0)
MCH: 29.9 pg (ref 26.0–34.0)
MCHC: 29.8 g/dL — ABNORMAL LOW (ref 30.0–36.0)
MCV: 100.2 fL — ABNORMAL HIGH (ref 80.0–100.0)
Platelets: 189 K/uL (ref 150–400)
RBC: 4.52 MIL/uL (ref 3.87–5.11)
RDW: 12.2 % (ref 11.5–15.5)
WBC: 9.8 K/uL (ref 4.0–10.5)
nRBC: 0 % (ref 0.0–0.2)

## 2024-09-03 LAB — COMPREHENSIVE METABOLIC PANEL WITH GFR
ALT: 16 U/L (ref 0–44)
AST: 26 U/L (ref 15–41)
Albumin: 4.7 g/dL (ref 3.5–5.0)
Alkaline Phosphatase: 88 U/L (ref 38–126)
Anion gap: 15 (ref 5–15)
BUN: 9 mg/dL (ref 8–23)
CO2: 23 mmol/L (ref 22–32)
Calcium: 9.8 mg/dL (ref 8.9–10.3)
Chloride: 103 mmol/L (ref 98–111)
Creatinine, Ser: 0.65 mg/dL (ref 0.44–1.00)
GFR, Estimated: >60 mL/min (ref >60)
Glucose, Bld: 110 mg/dL — ABNORMAL HIGH (ref 70–99)
Potassium: 3.5 mmol/L (ref 3.5–5.1)
Sodium: 141 mmol/L (ref 135–145)
Total Bilirubin: 0.3 mg/dL (ref 0.0–1.2)
Total Protein: 8.3 g/dL — ABNORMAL HIGH (ref 6.5–8.1)

## 2024-09-03 LAB — URINALYSIS, ROUTINE W REFLEX MICROSCOPIC
Bacteria, UA: NONE SEEN
Bilirubin Urine: NEGATIVE
Glucose, UA: 50 mg/dL — AB
Ketones, ur: 5 mg/dL — AB
Leukocytes,Ua: NEGATIVE
Nitrite: NEGATIVE
Protein, ur: >=300 mg/dL — AB
Specific Gravity, Urine: 1.033 — ABNORMAL HIGH (ref 1.005–1.030)
pH: 7 (ref 5.0–8.0)

## 2024-09-03 LAB — LACTIC ACID, PLASMA: Lactic Acid, Venous: 1.4 mmol/L (ref 0.5–1.9)

## 2024-09-03 LAB — LIPASE, BLOOD: Lipase: 27 U/L (ref 11–51)

## 2024-09-03 MED ORDER — DICYCLOMINE HCL 10 MG PO CAPS
10.0000 mg | ORAL_CAPSULE | Freq: Once | ORAL | Status: AC
  Administered 2024-09-03: 10 mg via ORAL
  Filled 2024-09-03: qty 1

## 2024-09-03 MED ORDER — MORPHINE SULFATE (PF) 4 MG/ML IV SOLN
4.0000 mg | Freq: Once | INTRAVENOUS | Status: AC
  Administered 2024-09-03: 4 mg via INTRAVENOUS
  Filled 2024-09-03: qty 1

## 2024-09-03 MED ORDER — SODIUM CHLORIDE 0.9 % IV BOLUS
500.0000 mL | Freq: Once | INTRAVENOUS | Status: AC
  Administered 2024-09-03: 500 mL via INTRAVENOUS

## 2024-09-03 MED ORDER — HYDROMORPHONE HCL 1 MG/ML IJ SOLN
0.5000 mg | Freq: Once | INTRAMUSCULAR | Status: AC
  Administered 2024-09-03: 0.5 mg via INTRAVENOUS
  Filled 2024-09-03: qty 0.5

## 2024-09-03 MED ORDER — FAMOTIDINE IN NACL 20-0.9 MG/50ML-% IV SOLN
20.0000 mg | Freq: Once | INTRAVENOUS | Status: AC
  Administered 2024-09-03: 20 mg via INTRAVENOUS
  Filled 2024-09-03: qty 50

## 2024-09-03 MED ORDER — IOHEXOL 300 MG/ML  SOLN
100.0000 mL | Freq: Once | INTRAMUSCULAR | Status: AC | PRN
  Administered 2024-09-03: 100 mL via INTRAVENOUS

## 2024-09-03 MED ORDER — ONDANSETRON HCL 4 MG/2ML IJ SOLN
4.0000 mg | Freq: Once | INTRAMUSCULAR | Status: AC
Start: 2024-09-03 — End: 2024-09-03
  Administered 2024-09-03: 4 mg via INTRAVENOUS
  Filled 2024-09-03: qty 2

## 2024-09-03 NOTE — Assessment & Plan Note (Signed)
 Etiology uncertain: Differential: Gastritis, mesenteric ischemia, intestinal migraine   possibility, versus intestinal migraine as well as acute gastritis CT abdomen and pelvis with contrast was nonacute Can consider mesenteric CTA Continue pain control IV hydration, IV antiemetics, IV Protonix Clear liquids as tolerated GI consulted for consideration of EGD

## 2024-09-03 NOTE — Assessment & Plan Note (Signed)
 Not acutely exacerbated DuoNebs as needed

## 2024-09-03 NOTE — Hospital Course (Signed)
 [name]

## 2024-09-03 NOTE — H&P (Signed)
 History and Physical    Patient: Jasmine Acosta FMW:[id number] DOB: 04-02-1951 DOA: 09/03/2024 DOS: the patient was seen and examined on 09/03/2024 PCP: Lenon Layman ORN, MD  Patient coming from: Home  Chief Complaint:  Chief Complaint  Patient presents with   Abdominal Pain    HPI: Jasmine Acosta is a 73 y.o. female with medical history significant for COPD, HTN, migraine, PVD, carotid artery stenosis s/p endarterectomy being admitted with refractory upper abdominal pain of uncertain etiology associated with nausea and vomiting which has since improved. On arrival in the ED vitals were within normal limits except for slightly elevated BP of 161/89 CBC, CMP and lipase unremarkable, UA not consistent with infectionCT abdomen and pelvis with contrast showed no acute findings Right upper quadrant ultrasound requested by family-still pending Patient was treated with IV fluids, IV Pepcid , Bentyl, Zofran  and morphine  but continued to have ongoing pain albeit without vomiting. She was treated with Dilaudid   Admission requested     Past Medical History:  Diagnosis Date   Carotid artery occlusion    COPD (chronic obstructive pulmonary disease) (HCC) 07/06/2015   Coronary artery disease    Headache    Hypertension    Hyperthyroidism    ablated with iodine   Peripheral vascular disease    Bilateral Carotid Artery Disease   Personal history of arterial venous malformation (AVM)    Pseudoclaudication    Past Surgical History:  Procedure Laterality Date   ABDOMINAL HYSTERECTOMY     heavy bleeding   CATARACT EXTRACTION W/PHACO Right 10/09/2018   Procedure: CATARACT EXTRACTION PHACO AND INTRAOCULAR LENS PLACEMENT (IOC) RIGHT;  Surgeon: Mittie Gaskin, MD;  Location: [facility];  Service: Ophthalmology;  Laterality: Right;   CATARACT EXTRACTION W/PHACO Left 01/01/2019   Procedure: CATARACT EXTRACTION PHACO AND INTRAOCULAR LENS PLACEMENT (IOC)  LEFT;  Surgeon:  Mittie Gaskin, MD;  Location: [facility];  Service: Ophthalmology;  Laterality: Left;   COLONOSCOPY WITH PROPOFOL  N/A 10/30/2019   Procedure: COLONOSCOPY WITH PROPOFOL ;  Surgeon: Toledo, Ladell POUR, MD;  Location: [facility] ENDOSCOPY;  Service: Gastroenterology;  Laterality: N/A;   CORONARY ANGIOPLASTY     ENDARTERECTOMY Right 06/14/2018   Procedure: ENDARTERECTOMY CAROTID;  Surgeon: Marea Selinda RAMAN, MD;  Location: [facility] ORS;  Service: Vascular;  Laterality: Right;   ESOPHAGOGASTRODUODENOSCOPY N/A 07/05/2019   Procedure: ESOPHAGOGASTRODUODENOSCOPY (EGD);  Surgeon: Gaylyn Gladis PENNER, MD;  Location: [facility] ENDOSCOPY;  Service: Endoscopy;  Laterality: N/A;   ESOPHAGOGASTRODUODENOSCOPY (EGD) WITH PROPOFOL  N/A 07/08/2019   Procedure: ESOPHAGOGASTRODUODENOSCOPY (EGD) WITH PROPOFOL ;  Surgeon: Gaylyn Gladis PENNER, MD;  Location: [facility] ENDOSCOPY;  Service: Endoscopy;  Laterality: N/A;   EYE SURGERY     hallux vagus correction Right    HALLUX VALGUS AUSTIN Left 07/01/2016   Procedure: HALLUX VALGUS AUSTIN left foot;  Surgeon: Krystal Rosella, DPM;  Location: [facility] ORS;  Service: Podiatry;  Laterality: Left;   LOWER EXTREMITY ANGIOGRAPHY Right 11/05/2018   Procedure: LOWER EXTREMITY ANGIOGRAPHY;  Surgeon: Marea Selinda RAMAN, MD;  Location: [facility] INVASIVE CV LAB;  Service: Cardiovascular;  Laterality: Right;   Social History:  reports that she quit smoking about 6 years ago. Her smoking use included cigarettes. She started smoking about 56 years ago. She has a 50 pack-year smoking history. She has never used smokeless tobacco. She reports that she does not drink alcohol and does not use drugs.  Allergies  Allergen Reactions   Lisinopril Other (See Comments)    Other reaction(s): Angioedema Of tongue only   Codeine Nausea And  Vomiting    Family History  Problem Relation Age of Onset   Diabetes Mother    Emphysema Father    Heart disease Sister    Heart disease Brother    Breast cancer Neg Hx    Ovarian cancer Neg  Hx    Colon cancer Neg Hx     Prior to Admission medications   Medication Sig Start Date End Date Taking? Authorizing Provider  amLODipine  (NORVASC ) 5 MG tablet Take 5 mg by mouth daily.  01/24/18   [provider]  aspirin  EC 81 MG tablet Take 1 tablet (81 mg total) by mouth daily. 11/05/18   Marea Selinda RAMAN, MD  clopidogrel  (PLAVIX ) 75 MG tablet Take 1 tablet (75 mg total) by mouth daily with breakfast. 12/07/18   Brown, Fallon E, NP  guaiFENesin  (MUCINEX ) 600 MG 12 hr tablet Take 600 mg by mouth 2 (two) times daily.    [provider]  Iron, Ferrous Sulfate, 325 (65 Fe) MG TABS Take 65 mg/kg of iron by mouth.    [provider]  meloxicam (MOBIC) 15 MG tablet Take by mouth. Patient not taking: Reported on 03/27/2024 12/31/20   [provider]  Multiple Vitamin (MULTIVITAMIN) capsule Take 1 capsule by mouth daily.    [provider]  potassium chloride  (K-DUR,KLOR-CON ) 10 MEQ tablet Take 10 mEq by mouth daily.    [provider]  rosuvastatin  (CRESTOR ) 40 MG tablet Take 40 mg by mouth daily.    [provider]  topiramate (TOPAMAX) 50 MG tablet Take 1 tablet by mouth 2 (two) times daily. 02/07/23   [provider]    Physical Exam: Vitals:   09/03/24 1714 09/03/24 1800 09/03/24 1910 09/03/24 2105  BP: (!) 159/79 (!) 140/69  (!) 165/102  Pulse: 86 83  81  Resp: 16 14  19   Temp: 97.9 F (36.6 C)   98 F (36.7 C)  TempSrc: Oral   Oral  SpO2: 99% 94% 100% 100%  Weight:      Height:       Physical Exam Vitals and nursing note reviewed.  Constitutional:      General: She is not in acute distress. HENT:     Head: Normocephalic and atraumatic.  Cardiovascular:     Rate and Rhythm: Normal rate and regular rhythm.     Heart sounds: Normal heart sounds.  Pulmonary:     Effort: Pulmonary effort is normal.     Breath sounds: Normal breath sounds.  Abdominal:     Palpations: Abdomen is soft.     Tenderness: There is no  abdominal tenderness.  Neurological:     Mental Status: Mental status is at baseline.     Labs on Admission: I have personally reviewed following labs and imaging studies  CBC: Recent Labs  Lab 09/03/24 1233  WBC 9.8  HGB 13.5  HCT 45.3  MCV 100.2*  PLT 189   Basic Metabolic Panel: Recent Labs  Lab 09/03/24 1316  NA 141  K 3.5  CL 103  CO2 23  GLUCOSE 110*  BUN 9  CREATININE 0.65  CALCIUM  9.8   GFR: Estimated Creatinine Clearance: 50 mL/min (by C-G formula based on SCr of 0.65 mg/dL). Liver Function Tests: Recent Labs  Lab 09/03/24 1316  AST 26  ALT 16  ALKPHOS 88  BILITOT 0.3  PROT 8.3*  ALBUMIN 4.7   Recent Labs  Lab 09/03/24 1316  LIPASE 27   No results for input(s): AMMONIA in the  last 168 hours. Coagulation Profile: No results for input(s): INR, PROTIME in the last 168 hours. Cardiac Enzymes: No results for input(s): CKTOTAL, CKMB, CKMBINDEX, TROPONINI in the last 168 hours. BNP (last 3 results) No results for input(s): PROBNP in the last 8760 hours. HbA1C: No results for input(s): HGBA1C in the last 72 hours. CBG: No results for input(s): GLUCAP in the last 168 hours. Lipid Profile: No results for input(s): CHOL, HDL, LDLCALC, TRIG, CHOLHDL, LDLDIRECT in the last 72 hours. Thyroid  Function Tests: No results for input(s): TSH, T4TOTAL, FREET4, T3FREE, THYROIDAB in the last 72 hours. Anemia Panel: No results for input(s): VITAMINB12, FOLATE, FERRITIN, TIBC, IRON, RETICCTPCT in the last 72 hours. Urine analysis:    Component Value Date/Time   COLORURINE YELLOW (A) 09/03/2024 1847   APPEARANCEUR CLEAR (A) 09/03/2024 1847   LABSPEC 1.033 (H) 09/03/2024 1847   PHURINE 7.0 09/03/2024 1847   GLUCOSEU 50 (A) 09/03/2024 1847   HGBUR MODERATE (A) 09/03/2024 1847   BILIRUBINUR NEGATIVE 09/03/2024 1847   KETONESUR 5 (A) 09/03/2024 1847   PROTEINUR >=300 (A) 09/03/2024 1847   NITRITE NEGATIVE  09/03/2024 1847   LEUKOCYTESUR NEGATIVE 09/03/2024 1847    Radiological Exams on Admission: CT ABDOMEN PELVIS W CONTRAST Result Date: 09/03/2024 EXAM: CT ABDOMEN AND PELVIS WITH CONTRAST 09/03/2024 06:51:48 PM TECHNIQUE: CT of the abdomen and pelvis was performed with the administration of 100 mL of iohexol  (OMNIPAQUE ) 300 MG/ML solution. Multiplanar reformatted images are provided for review. Automated exposure control, iterative reconstruction, and/or weight-based adjustment of the mA/kV was utilized to reduce the radiation dose to as low as reasonably achievable. COMPARISON: 05/16/2022. CLINICAL HISTORY: Abdominal pain, acute, nonlocalized. FINDINGS: LOWER CHEST: No acute abnormality. LIVER: The liver is unremarkable. GALLBLADDER AND BILE DUCTS: Gallbladder is unremarkable. No biliary ductal dilatation. SPLEEN: No acute abnormality. PANCREAS: No acute abnormality. ADRENAL GLANDS: No acute abnormality. KIDNEYS, URETERS AND BLADDER: No stones in the kidneys or ureters. No hydronephrosis. No perinephric or periureteral stranding. Urinary bladder is unremarkable. GI AND BOWEL: Stomach demonstrates no acute abnormality. There is no bowel obstruction. Normal appendix (image 49). PERITONEUM AND RETROPERITONEUM: Small amount of free fluid in dependent pelvis. No free air. VASCULATURE: Atherosclerotic calcifications of the abdominal aorta and branch vessels, although patent. LYMPH NODES: No lymphadenopathy. REPRODUCTIVE ORGANS: Surgically absent uterus. BONES AND SOFT TISSUES: Mild multilevel degenerative changes in visualized spine. No focal soft tissue abnormality. IMPRESSION: 1. No acute findings in the abdomen or pelvis. Electronically signed by: Pinkie Pebbles MD 09/03/2024 07:01 PM EST RP Workstation: [id number]   Data Reviewed for HPI: Relevant notes from primary care and specialist visits, past discharge summaries as available in EHR, including Care Everywhere. Prior diagnostic testing as pertinent to  current admission diagnoses Updated medications and problem lists for reconciliation ED course, including vitals, labs, imaging, treatment and response to treatment Triage notes, nursing and pharmacy notes and ED provider's notes Notable results as noted above in HPI      Assessment and Plan: * Intractable abdominal pain Etiology uncertain: Differential: Gastritis, mesenteric ischemia, intestinal migraine   possibility, versus intestinal migraine as well as acute gastritis CT abdomen and pelvis with contrast was nonacute Can consider mesenteric CTA Continue pain control IV hydration, IV antiemetics, IV Protonix Clear liquids as tolerated GI consulted for consideration of EGD   Migraines Continue topiramate  Atherosclerosis of native arteries of extremity with intermittent claudication Carotid artery stenosis s/p endarterectomy Continue aspirin  and rosuvastatin   Essential hypertension Continue amlodipine  Hydralazine  as needed for additional control  COPD (chronic obstructive pulmonary disease) (HCC) Not acutely exacerbated DuoNebs as needed    DVT prophylaxis: Lovenox  Consults: none  Advance Care Planning:   Code Status: Prior   Family Communication: none  Disposition Plan: Back to previous home environment  Severity of Illness: The appropriate patient status for this patient is OBSERVATION. Observation status is judged to be reasonable and necessary in order to provide the required intensity of service to ensure the patient's safety. The patient's presenting symptoms, physical exam findings, and initial radiographic and laboratory data in the context of their medical condition is felt to place them at decreased risk for further clinical deterioration. Furthermore, it is anticipated that the patient will be medically stable for discharge from the hospital within 2 midnights of admission.   Author: Delayne LULLA Solian, MD 09/03/2024 11:22 PM  For on call review  [email or web address].

## 2024-09-03 NOTE — Assessment & Plan Note (Signed)
 Carotid artery stenosis s/p endarterectomy Continue aspirin  and rosuvastatin 

## 2024-09-03 NOTE — ED Provider Notes (Signed)
 [facility] Provider Note    Event Date/Time   First MD Initiated Contact with Patient 09/03/24 1722     (approximate)   History   Abdominal Pain   HPI  Jasmine Acosta is a 73 y.o. female with a history of hypertension, carotid artery disease, COPD, PVD, and migraine who presents with periumbilical and lower abdominal pain since 1 AM today, persistent course, associated with nausea and multiple episodes of vomiting.  The patient had a small bowel movement today.  She denies diarrhea.  She has no urinary symptoms.  She has no fever or chills.  She denies eating anything out of the ordinary yesterday, states that she was feeling fine yesterday.  The pain is mostly along the midline in the lower abdomen.  I reviewed the past medical records.  The patient's most recent outpatient encounter was on 10/22 with internal medicine for follow-up of her chronic conditions.   Physical Exam   Triage Vital Signs: ED Triage Vitals  Encounter Vitals Group     BP 09/03/24 1228 (!) 161/89     Girls Systolic BP Percentile --      Girls Diastolic BP Percentile --      Boys Systolic BP Percentile --      Boys Diastolic BP Percentile --      Pulse Rate 09/03/24 1228 90     Resp 09/03/24 1228 18     Temp 09/03/24 1228 98.7 F (37.1 C)     Temp Source 09/03/24 1228 Oral     SpO2 09/03/24 1228 100 %     Weight 09/03/24 1230 132 lb (59.9 kg)     Height 09/03/24 1230 5' (1.524 m)     Head Circumference --      Peak Flow --      Pain Score 09/03/24 1230 10     Pain Loc --      Pain Education --      Exclude from Growth Chart --     Most recent vital signs: Vitals:   09/03/24 1910 09/03/24 2105  BP:  (!) 165/102  Pulse:  81  Resp:  19  Temp:  98 F (36.7 C)  SpO2: 100% 100%     General: Alert, no distress.  CV:  Good peripheral perfusion.  Resp:  Normal effort.  Abd:  Soft with mild to moderate midline and bilateral lower quadrant tenderness.  No distention.   Other:  No jaundice or scleral icterus.   ED Results / Procedures / Treatments   Labs (all labs ordered are listed, but only abnormal results are displayed) Labs Reviewed  CBC - Abnormal; Notable for the following components:      Result Value   MCV 100.2 (*)    MCHC 29.8 (*)    All other components within normal limits  URINALYSIS, ROUTINE W REFLEX MICROSCOPIC - Abnormal; Notable for the following components:   Color, Urine YELLOW (*)    APPearance CLEAR (*)    Specific Gravity, Urine 1.033 (*)    Glucose, UA 50 (*)    Hgb urine dipstick MODERATE (*)    Ketones, ur 5 (*)    Protein, ur >=300 (*)    All other components within normal limits  COMPREHENSIVE METABOLIC PANEL WITH GFR - Abnormal; Notable for the following components:   Glucose, Bld 110 (*)    Total Protein 8.3 (*)    All other components within normal limits  LIPASE, BLOOD  LACTIC ACID, PLASMA  LACTIC ACID, PLASMA     EKG     RADIOLOGY  CT abdomen/pelvis: I independently viewed and interpreted the images; there are no dilated bowel loops or any free air or free fluid.  Radiology report indicates no acute abnormality.  PROCEDURES:  Critical Care performed: No  Procedures   MEDICATIONS ORDERED IN ED: Medications  ondansetron  (ZOFRAN ) injection 4 mg (4 mg Intravenous Given 09/03/24 1748)  morphine  (PF) 4 MG/ML injection 4 mg (4 mg Intravenous Given 09/03/24 1748)  sodium chloride  0.9 % bolus 500 mL (0 mLs Intravenous Stopped 09/03/24 2101)  iohexol  (OMNIPAQUE ) 300 MG/ML solution 100 mL (100 mLs Intravenous Contrast Given 09/03/24 1824)  famotidine  (PEPCID ) IVPB 20 mg premix (0 mg Intravenous Stopped 09/03/24 2208)  dicyclomine (BENTYL) capsule 10 mg (10 mg Oral Given 09/03/24 2101)  sodium chloride  0.9 % bolus 500 mL (500 mLs Intravenous New Bag/Given 09/03/24 2242)  HYDROmorphone  (DILAUDID ) injection 0.5 mg (0.5 mg Intravenous Given 09/03/24 2237)     IMPRESSION / MDM / ASSESSMENT AND PLAN / ED  COURSE  I reviewed the triage vital signs and the nursing notes.  73 year old female with PMH as noted above presents with lower abdominal pain since 1 AM today associated with nausea and vomiting.  She is tender on exam.  Vital signs are normal except for hypertension.  CMP shows no acute findings.  CBC is unremarkable.  Lipase is normal.  Differential diagnosis includes, but is not limited to, colitis, diverticulitis, SBO, volvulus, UTI, cystitis, gastroenteritis.  We will give analgesia and obtain a CT for further evaluation.  Patient's presentation is most consistent with acute presentation with potential threat to life or bodily function.  ----------------------------------------- 11:15 PM on 09/03/2024 -----------------------------------------  CT shows no acute findings.  Urinalysis shows some RBCs and WBCs but no specific evidence to suggest infection.  The patient has had refractory pain.  I tried to give on Pepcid  which has also not helped.  She has now gotten Dilaudid  and some additional fluids.  Overall I suspect gastritis, gastroparesis, PUD, or gastroenteritis.  There is no clinical evidence for a vascular etiology.  However given the persistent symptoms she will need further management in the hospital.  I have added on a lactic acid as well as a right upper quadrant ultrasound.  I consulted Dr. Cleatus from the hospitalist service; based on our discussion she agrees to evaluate the patient for admission.   FINAL CLINICAL IMPRESSION(S) / ED DIAGNOSES   Final diagnoses:  Pain of upper abdomen     Rx / DC Orders   ED Discharge Orders     None        Note:  This document was prepared using Dragon voice recognition software and may include unintentional dictation errors.    Jacolyn Pae, MD 09/03/24 2317

## 2024-09-03 NOTE — ED Triage Notes (Signed)
 Patient to ED via POV for mid abd pain. Started around 1am with vomiting.

## 2024-09-03 NOTE — Assessment & Plan Note (Signed)
 Continue topiramate.

## 2024-09-03 NOTE — Assessment & Plan Note (Signed)
 Continue amlodipine  Hydralazine  as needed for additional control

## 2024-09-04 ENCOUNTER — Observation Stay: Admitting: Certified Registered Nurse Anesthetist

## 2024-09-04 ENCOUNTER — Encounter
Admission: EM | Disposition: A | Discharge status: Home / Self Care | Payer: Self-pay | Source: Home / Self Care | Attending: Emergency Medicine

## 2024-09-04 ENCOUNTER — Encounter: Payer: Self-pay | Admitting: Gastroenterology

## 2024-09-04 DIAGNOSIS — I2510 Atherosclerotic heart disease of native coronary artery without angina pectoris: Secondary | ICD-10-CM | POA: Diagnosis not present

## 2024-09-04 DIAGNOSIS — K31819 Angiodysplasia of stomach and duodenum without bleeding: Secondary | ICD-10-CM

## 2024-09-04 DIAGNOSIS — Z87891 Personal history of nicotine dependence: Secondary | ICD-10-CM | POA: Diagnosis not present

## 2024-09-04 DIAGNOSIS — Z13810 Encounter for screening for upper gastrointestinal disorder: Secondary | ICD-10-CM | POA: Diagnosis not present

## 2024-09-04 DIAGNOSIS — R109 Unspecified abdominal pain: Secondary | ICD-10-CM | POA: Diagnosis not present

## 2024-09-04 DIAGNOSIS — I10 Essential (primary) hypertension: Secondary | ICD-10-CM | POA: Diagnosis not present

## 2024-09-04 HISTORY — PX: ESOPHAGOGASTRODUODENOSCOPY: SHX5428

## 2024-09-04 LAB — BASIC METABOLIC PANEL WITH GFR
Anion gap: 14 (ref 5–15)
BUN: 10 mg/dL (ref 8–23)
CO2: 23 mmol/L (ref 22–32)
Calcium: 9.6 mg/dL (ref 8.9–10.3)
Chloride: 103 mmol/L (ref 98–111)
Creatinine, Ser: 0.63 mg/dL (ref 0.44–1.00)
GFR, Estimated: >60 mL/min (ref >60)
Glucose, Bld: 113 mg/dL — ABNORMAL HIGH (ref 70–99)
Potassium: 3.4 mmol/L — ABNORMAL LOW (ref 3.5–5.1)
Sodium: 140 mmol/L (ref 135–145)

## 2024-09-04 LAB — CBC
HCT: 39.8 % (ref 36.0–46.0)
Hemoglobin: 12.7 g/dL (ref 12.0–15.0)
MCH: 29.3 pg (ref 26.0–34.0)
MCHC: 31.9 g/dL (ref 30.0–36.0)
MCV: 91.9 fL (ref 80.0–100.0)
Platelets: 151 K/uL (ref 150–400)
RBC: 4.33 MIL/uL (ref 3.87–5.11)
RDW: 12.2 % (ref 11.5–15.5)
WBC: 11.6 K/uL — ABNORMAL HIGH (ref 4.0–10.5)
nRBC: 0 % (ref 0.0–0.2)

## 2024-09-04 LAB — URINE DRUG SCREEN
Amphetamines: NEGATIVE
Barbiturates: NEGATIVE
Benzodiazepines: NEGATIVE
Cocaine: NEGATIVE
Fentanyl: NEGATIVE
Methadone Scn, Ur: NEGATIVE
Opiates: NEGATIVE
Tetrahydrocannabinol: POSITIVE — AB

## 2024-09-04 LAB — VITAMIN B12: Vitamin B-12: 173 pg/mL — ABNORMAL LOW (ref 180–914)

## 2024-09-04 LAB — LACTIC ACID, PLASMA: Lactic Acid, Venous: 1.1 mmol/L (ref 0.5–1.9)

## 2024-09-04 LAB — FOLATE: Folate: 15.9 ng/mL (ref >5.9)

## 2024-09-04 LAB — MAGNESIUM: Magnesium: 2.3 mg/dL (ref 1.7–2.4)

## 2024-09-04 LAB — PHOSPHORUS: Phosphorus: 2.9 mg/dL (ref 2.5–4.6)

## 2024-09-04 SURGERY — EGD (ESOPHAGOGASTRODUODENOSCOPY)
Anesthesia: General

## 2024-09-04 MED ORDER — ONDANSETRON HCL 4 MG/2ML IJ SOLN
4.0000 mg | Freq: Four times a day (QID) | INTRAMUSCULAR | Status: DC | PRN
  Administered 2024-09-04 – 2024-09-05 (×2): 4 mg via INTRAVENOUS
  Filled 2024-09-04 (×2): qty 2

## 2024-09-04 MED ORDER — ASPIRIN 81 MG PO CHEW
81.0000 mg | CHEWABLE_TABLET | Freq: Every day | ORAL | Status: DC
  Administered 2024-09-05: 81 mg via ORAL
  Filled 2024-09-04: qty 1

## 2024-09-04 MED ORDER — HYDROCODONE-ACETAMINOPHEN 5-325 MG PO TABS
1.0000 | ORAL_TABLET | ORAL | Status: DC | PRN
  Administered 2024-09-04 – 2024-09-05 (×2): 2 via ORAL
  Filled 2024-09-04 (×2): qty 2

## 2024-09-04 MED ORDER — ENOXAPARIN SODIUM 40 MG/0.4ML IJ SOSY: 40.0000 mg | PREFILLED_SYRINGE | Freq: Every evening | INTRAMUSCULAR | Status: DC

## 2024-09-04 MED ORDER — MORPHINE SULFATE (PF) 2 MG/ML IV SOLN: 2.0000 mg | INTRAVENOUS | Status: DC | PRN

## 2024-09-04 MED ORDER — PROPOFOL 10 MG/ML IV BOLUS
INTRAVENOUS | Status: DC | PRN
  Administered 2024-09-04: 50 mg via INTRAVENOUS

## 2024-09-04 MED ORDER — GLYCOPYRROLATE 0.2 MG/ML IJ SOLN
INTRAMUSCULAR | Status: DC | PRN
  Administered 2024-09-04: .2 mg via INTRAVENOUS

## 2024-09-04 MED ORDER — ACETAMINOPHEN 650 MG RE SUPP
650.0000 mg | Freq: Four times a day (QID) | RECTAL | Status: DC | PRN
Start: 2024-09-04 — End: 2024-09-05

## 2024-09-04 MED ORDER — LIDOCAINE HCL (CARDIAC) PF 100 MG/5ML IV SOSY
PREFILLED_SYRINGE | INTRAVENOUS | Status: DC | PRN
  Administered 2024-09-04: 60 mg via INTRAVENOUS

## 2024-09-04 MED ORDER — CLOPIDOGREL BISULFATE 75 MG PO TABS
75.0000 mg | ORAL_TABLET | Freq: Every day | ORAL | Status: DC
  Administered 2024-09-05: 75 mg via ORAL
  Filled 2024-09-04: qty 1

## 2024-09-04 MED ORDER — SODIUM CHLORIDE 0.9 % IV SOLN: INTRAVENOUS | Status: DC

## 2024-09-04 MED ORDER — TOPIRAMATE 25 MG PO TABS
50.0000 mg | ORAL_TABLET | Freq: Two times a day (BID) | ORAL | Status: DC
  Administered 2024-09-04 – 2024-09-05 (×4): 50 mg via ORAL
  Filled 2024-09-04 (×4): qty 2

## 2024-09-04 MED ORDER — ACETAMINOPHEN 325 MG PO TABS
650.0000 mg | ORAL_TABLET | Freq: Four times a day (QID) | ORAL | Status: DC | PRN
  Administered 2024-09-04: 650 mg via ORAL
  Filled 2024-09-04: qty 2

## 2024-09-04 MED ORDER — PANTOPRAZOLE SODIUM 40 MG IV SOLR
40.0000 mg | Freq: Every day | INTRAVENOUS | Status: DC
  Administered 2024-09-04: 40 mg via INTRAVENOUS
  Filled 2024-09-04: qty 10

## 2024-09-04 MED ORDER — SODIUM CHLORIDE 0.9 % IV SOLN: INTRAVENOUS | Status: AC

## 2024-09-04 MED ORDER — HYDRALAZINE HCL 20 MG/ML IJ SOLN
5.0000 mg | Freq: Four times a day (QID) | INTRAMUSCULAR | Status: DC | PRN
Start: 2024-09-04 — End: 2024-09-05

## 2024-09-04 MED ORDER — ROSUVASTATIN CALCIUM 10 MG PO TABS
40.0000 mg | ORAL_TABLET | Freq: Every evening | ORAL | Status: DC
  Administered 2024-09-04: 40 mg via ORAL
  Filled 2024-09-04: qty 4

## 2024-09-04 MED ORDER — PROPOFOL 500 MG/50ML IV EMUL
INTRAVENOUS | Status: DC | PRN
  Administered 2024-09-04: 150 ug/kg/min via INTRAVENOUS

## 2024-09-04 MED ORDER — SODIUM CHLORIDE 0.9 % IV SOLN: 12.5000 mg | Freq: Four times a day (QID) | INTRAVENOUS | Status: DC | PRN

## 2024-09-04 MED ORDER — AMLODIPINE BESYLATE 5 MG PO TABS
5.0000 mg | ORAL_TABLET | Freq: Every day | ORAL | Status: DC
  Administered 2024-09-04 – 2024-09-05 (×2): 5 mg via ORAL
  Filled 2024-09-04 (×2): qty 1

## 2024-09-04 MED ORDER — ONDANSETRON HCL 4 MG PO TABS: 4.0000 mg | ORAL_TABLET | Freq: Four times a day (QID) | ORAL | Status: DC | PRN

## 2024-09-04 MED ORDER — PANTOPRAZOLE SODIUM 40 MG PO TBEC
40.0000 mg | DELAYED_RELEASE_TABLET | Freq: Two times a day (BID) | ORAL | Status: DC
  Administered 2024-09-04 – 2024-09-05 (×3): 40 mg via ORAL
  Filled 2024-09-04 (×3): qty 1

## 2024-09-04 NOTE — Plan of Care (Signed)
   Problem: Education: Goal: Knowledge of General Education information will improve Description Including pain rating scale, medication(s)/side effects and non-pharmacologic comfort measures Outcome: Progressing

## 2024-09-04 NOTE — Anesthesia Postprocedure Evaluation (Signed)
 Anesthesia Post Note  Patient: Jasmine Acosta  Procedure(s) Performed: EGD (ESOPHAGOGASTRODUODENOSCOPY)  Patient location during evaluation: PACU Anesthesia Type: General Level of consciousness: awake Pain management: satisfactory to patient Vital Signs Assessment: post-procedure vital signs reviewed and stable Respiratory status: spontaneous breathing Cardiovascular status: stable Anesthetic complications: no   No notable events documented.   Last Vitals:  Vitals:   09/04/24 1305 09/04/24 1359  BP: (!) 146/70 (!) 140/82  Pulse: 75 89  Resp: 19 16  Temp: (!) 36.2 C (!) 36.2 C  SpO2: 97% 100%    Last Pain:  Vitals:   09/04/24 1359  TempSrc: Temporal  PainSc: Asleep                 VAN STAVEREN,Gracee Ratterree

## 2024-09-04 NOTE — Transfer of Care (Signed)
 Immediate Anesthesia Transfer of Care Note  Patient: Jasmine Acosta  Procedure(s) Performed: EGD (ESOPHAGOGASTRODUODENOSCOPY)  Patient Location: PACU  Anesthesia Type:General  Level of Consciousness: drowsy and patient cooperative  Airway & Oxygen Therapy: Patient Spontanous Breathing  Post-op Assessment: Report given to RN and Post -op Vital signs reviewed and stable  Post vital signs: stable  Last Vitals:  Vitals Value Taken Time  BP 140/82 09/04/24 13:59  Temp 36.2 C 09/04/24 13:59  Pulse 93 09/04/24 14:01  Resp 16 09/04/24 14:01  SpO2 98 % 09/04/24 14:01  Vitals shown include unfiled device data.  Last Pain:  Vitals:   09/04/24 1359  TempSrc: Temporal  PainSc: Asleep         Complications: No notable events documented.

## 2024-09-04 NOTE — Care Management Obs Status (Signed)
 MEDICARE OBSERVATION STATUS NOTIFICATION   Patient Details  Name: Jasmine Acosta MRN: [id number] Date of Birth: 1951-07-09   Medicare Observation Status Notification Given:  Yes    Jasmine Acosta 09/04/2024, 3:03 PM

## 2024-09-04 NOTE — Anesthesia Preprocedure Evaluation (Signed)
 Anesthesia Evaluation  Patient identified by MRN, date of birth, ID band Patient awake    Reviewed: Allergy & Precautions, NPO status , Patient's Chart, lab work & pertinent test results  Airway Mallampati: II  TM Distance: >3 FB Neck ROM: full    Dental  (+) Upper Dentures, Lower Dentures   Pulmonary neg pulmonary ROS, COPD, former smoker   Pulmonary exam normal  + decreased breath sounds      Cardiovascular Exercise Tolerance: Poor hypertension, Pt. on medications + CAD and + Peripheral Vascular Disease  negative cardio ROS Normal cardiovascular exam Rhythm:Regular Rate:Normal     Neuro/Psych  Headaches negative neurological ROS  negative psych ROS   GI/Hepatic negative GI ROS, Neg liver ROS,,,  Endo/Other  negative endocrine ROS Hyperthyroidism   Renal/GU negative Renal ROS  negative genitourinary   Musculoskeletal   Abdominal Normal abdominal exam  (+)   Peds negative pediatric ROS (+)  Hematology negative hematology ROS (+)   Anesthesia Other Findings Past Medical History: No date: Carotid artery occlusion 07/06/2015: COPD (chronic obstructive pulmonary disease) (HCC) No date: Coronary artery disease No date: Headache No date: Hypertension No date: Hyperthyroidism     Comment:  ablated with iodine No date: Peripheral vascular disease     Comment:  Bilateral Carotid Artery Disease No date: Personal history of arterial venous malformation (AVM) No date: Pseudoclaudication  Past Surgical History: No date: ABDOMINAL HYSTERECTOMY     Comment:  heavy bleeding 10/09/2018: CATARACT EXTRACTION W/PHACO; Right     Comment:  Procedure: CATARACT EXTRACTION PHACO AND INTRAOCULAR               LENS PLACEMENT (IOC) RIGHT;  Surgeon: Mittie Gaskin, MD;  Location: [facility];  Service:               Ophthalmology;  Laterality: Right; 01/01/2019: CATARACT EXTRACTION W/PHACO; Left      Comment:  Procedure: CATARACT EXTRACTION PHACO AND INTRAOCULAR               LENS PLACEMENT (IOC)  LEFT;  Surgeon: Mittie Gaskin, MD;  Location: [facility];  Service:               Ophthalmology;  Laterality: Left; 10/30/2019: COLONOSCOPY WITH PROPOFOL ; N/A     Comment:  Procedure: COLONOSCOPY WITH PROPOFOL ;  Surgeon: [place],               Ladell POUR, MD;  Location: [facility] ENDOSCOPY;  Service:               Gastroenterology;  Laterality: N/A; No date: CORONARY ANGIOPLASTY 06/14/2018: ENDARTERECTOMY; Right     Comment:  Procedure: ENDARTERECTOMY CAROTID;  Surgeon: Marea Selinda RAMAN, MD;  Location: [facility] ORS;  Service: Vascular;                Laterality: Right; 07/05/2019: ESOPHAGOGASTRODUODENOSCOPY; N/A     Comment:  Procedure: ESOPHAGOGASTRODUODENOSCOPY (EGD);  Surgeon:               Gaylyn Gladis PENNER, MD;  Location: [facility] ENDOSCOPY;                Service: Endoscopy;  Laterality: N/A; 07/08/2019: ESOPHAGOGASTRODUODENOSCOPY (EGD) WITH PROPOFOL ; N/A     Comment:  Procedure: ESOPHAGOGASTRODUODENOSCOPY (  EGD) WITH               PROPOFOL ;  Surgeon: Gaylyn Gladis PENNER, MD;  Location:               [facility] ENDOSCOPY;  Service: Endoscopy;  Laterality: N/A; No date: EYE SURGERY No date: hallux vagus correction; Right 07/01/2016: HALLUX VALGUS AUSTIN; Left     Comment:  Procedure: HALLUX VALGUS AUSTIN left foot;  Surgeon:               Krystal Rosella, DPM;  Location: [facility] ORS;  Service: Podiatry;              Laterality: Left; 11/05/2018: LOWER EXTREMITY ANGIOGRAPHY; Right     Comment:  Procedure: LOWER EXTREMITY ANGIOGRAPHY;  Surgeon: Marea Selinda RAMAN, MD;  Location: [facility] INVASIVE CV LAB;  Service:               Cardiovascular;  Laterality: Right;  BMI    Body Mass Index: 24.63 kg/m      Reproductive/Obstetrics negative OB ROS                              Anesthesia Physical Anesthesia Plan  ASA: 3  Anesthesia Plan: General    Post-op Pain Management:    Induction: Intravenous  PONV Risk Score and Plan: Propofol  infusion and TIVA  Airway Management Planned: Natural Airway and Nasal Cannula  Additional Equipment:   Intra-op Plan:   Post-operative Plan:   Informed Consent: I have reviewed the patients History and Physical, chart, labs and discussed the procedure including the risks, benefits and alternatives for the proposed anesthesia with the patient or authorized representative who has indicated his/her understanding and acceptance.     Dental Advisory Given  Plan Discussed with: CRNA  Anesthesia Plan Comments:         Anesthesia Quick Evaluation

## 2024-09-04 NOTE — Plan of Care (Signed)

## 2024-09-04 NOTE — TOC CM/SW Note (Signed)
 Transition of Care [facility]) - Inpatient Brief Assessment   Patient Details  Name: Jasmine Acosta MRN: [id number] Date of Birth: [birth year]  [facility] CM/SW Contact:    Corean ONEIDA Haddock, RN Phone Number: 09/04/2024, 7:55 AM   Clinical Narrative:   Transition of Care (TOC) Screening Note   Patient Details  Name: Jasmine Acosta Date of Birth: [birth year]   Transition of Care [facility]) CM/SW Contact:    Corean ONEIDA Haddock, RN Phone Number: 09/04/2024, 7:55 AM    Transition of Care Department [facility]) has reviewed patient and no TOC needs have been identified at this time.  If new patient transition needs arise, please place a TOC consult.    Transition of Care Asessment: Insurance and Status: Insurance coverage has been reviewed Patient has primary care physician: Yes     Prior/Current Home Services: No current home services Social Drivers of Health Review: SDOH reviewed no interventions necessary Readmission risk has been reviewed: No (obs status.  no score generated) Transition of care needs: no transition of care needs at this time

## 2024-09-04 NOTE — Consult Note (Signed)
 [name], MD [facility]  [address], [address] [place], [place] [address] Phone: [phone] Fax : [phone]  Consultation  Referring Provider:     Dr. Cleatus Primary Care Physician:  Lenon Layman ORN, MD Primary Gastroenterologist:  Dr. Rolene         Reason for Consultation:     Abdominal pain  Date of Admission:  09/03/2024 Date of Consultation:  09/04/2024   HPI:   Jasmine Acosta is a 73 y.o. female who has a history of migraines with COPD peripheral vascular disease coronary artery disease hypertension and came to the emergency department due to abdominal pain that started a few hours prior to admission.  She had reported that she was having some nausea associated with the abdominal pain and multiple episodes of vomiting.  Patient had a CT scan of the abdomen and ultrasound also done on admission that did not show any findings that the abdominal pain could be attributed to.  The patient CBC showed a normal hemoglobin and hematocrit with the white cell count on admission of 9.8 that went up to 11.6 last night.  The patients renal function was normal and her lactic acid was also normal. In the emergency department the patient was treated with IV fluids, Pepcid , Zofran , morphine , Dilaudid  in addition to Bentyl but continues to have abdominal pain but it was reported that the vomiting had stopped.  The patient had a colonoscopy in January 2021 that showed 1 polyp in the sigmoid colon.  The patient also had an upper endoscopy in September 2020 that showed a duodenal AVM. The patient reports that her abdominal pain is completely gone without any residual pain at the present time.  The patient has been kept n.p.o. after midnight.  Past Medical History:  Diagnosis Date   Carotid artery occlusion    COPD (chronic obstructive pulmonary disease) (HCC) 07/06/2015   Coronary artery disease    Headache    Hypertension    Hyperthyroidism    ablated with iodine   Peripheral vascular  disease    Bilateral Carotid Artery Disease   Personal history of arterial venous malformation (AVM)    Pseudoclaudication     Past Surgical History:  Procedure Laterality Date   ABDOMINAL HYSTERECTOMY     heavy bleeding   CATARACT EXTRACTION W/PHACO Right 10/09/2018   Procedure: CATARACT EXTRACTION PHACO AND INTRAOCULAR LENS PLACEMENT (IOC) RIGHT;  Surgeon: Mittie Gaskin, MD;  Location: [facility];  Service: Ophthalmology;  Laterality: Right;   CATARACT EXTRACTION W/PHACO Left 01/01/2019   Procedure: CATARACT EXTRACTION PHACO AND INTRAOCULAR LENS PLACEMENT (IOC)  LEFT;  Surgeon: Mittie Gaskin, MD;  Location: [facility];  Service: Ophthalmology;  Laterality: Left;   COLONOSCOPY WITH PROPOFOL  N/A 10/30/2019   Procedure: COLONOSCOPY WITH PROPOFOL ;  Surgeon: Toledo, Ladell POUR, MD;  Location: [facility] END[facility]COPY;  Service: Gastroenterology;  Laterality: N/A;   CORONARY ANGIOPLASTY     ENDARTERECTOMY Right 06/14/2018   Procedure: ENDARTERECTOMY CAROTID;  Surgeon: Marea Selinda RAMAN, MD;  Location: [facility] ORS;  Service: Vascular;  Laterality: Right;   ESOPHAGOGASTRODUODENOSCOPY N/A 07/05/2019   Procedure: ESOPHAGOGASTRODUODENOSCOPY (EGD);  Surgeon: Gaylyn Gladis PENNER, MD;  Location: [facility] END[facility]COPY;  Service: Endoscopy;  Laterality: N/A;   ESOPHAGOGASTRODUODENOSCOPY (EGD) WITH PROPOFOL  N/A 07/08/2019   Procedure: ESOPHAGOGASTRODUODENOSCOPY (EGD) WITH PROPOFOL ;  Surgeon: Gaylyn Gladis PENNER, MD;  Location: [facility] ENDOSCOPY;  Service: Endoscopy;  Laterality: N/A;   EYE SURGERY     hallux vagus correction Right  HALLUX VALGUS AUSTIN Left 07/01/2016   Procedure: HALLUX VALGUS AUSTIN left foot;  Surgeon: Krystal Rosella, DPM;  Location: [facility] ORS;  Service: Podiatry;  Laterality: Left;   LOWER EXTREMITY ANGIOGRAPHY Right 11/05/2018   Procedure: LOWER EXTREMITY ANGIOGRAPHY;  Surgeon: Marea Selinda RAMAN, MD;  Location: [facility] INVASIVE CV LAB;  Service: Cardiovascular;  Laterality: Right;    Prior to  Admission medications   Medication Sig Start Date End Date Taking? Authorizing Provider  amLODipine  (NORVASC ) 5 MG tablet Take 5 mg by mouth daily.  01/24/18   [provider]  aspirin  EC 81 MG tablet Take 1 tablet (81 mg total) by mouth daily. 11/05/18   Marea Selinda RAMAN, MD  clopidogrel  (PLAVIX ) 75 MG tablet Take 1 tablet (75 mg total) by mouth daily with breakfast. 12/07/18   Brown, Fallon E, NP  guaiFENesin  (MUCINEX ) 600 MG 12 hr tablet Take 600 mg by mouth 2 (two) times daily.    [provider]  Iron, Ferrous Sulfate, 325 (65 Fe) MG TABS Take 65 mg/kg of iron by mouth.    [provider]  meloxicam (MOBIC) 15 MG tablet Take by mouth. Patient not taking: Reported on 03/27/2024 12/31/20   [provider]  Multiple Vitamin (MULTIVITAMIN) capsule Take 1 capsule by mouth daily.    [provider]  potassium chloride  (K-DUR,KLOR-CON ) 10 MEQ tablet Take 10 mEq by mouth daily.    [provider]  rosuvastatin  (CRESTOR ) 40 MG tablet Take 40 mg by mouth daily.    [provider]  topiramate (TOPAMAX) 50 MG tablet Take 1 tablet by mouth 2 (two) times daily. 02/07/23   [provider]    Family History  Problem Relation Age of Onset   Diabetes Mother    Emphysema Father    Heart disease Sister    Heart disease Brother    Breast cancer Neg Hx    Ovarian cancer Neg Hx    Colon cancer Neg Hx      Social History   Tobacco Use   Smoking status: Former    Current packs/day: 0.00    Average packs/day: 1 pack/day for 50.0 years (50.0 ttl pk-yrs)    Types: Cigarettes    Start date: 06/14/1968    Quit date: 06/14/2018    Years since quitting: 6.2   Smokeless tobacco: Never   Tobacco comments:    since age 53  Vaping Use   Vaping status: Never Used  Substance Use Topics   Alcohol use: No   Drug use: No    Allergies as of 09/03/2024 - Review Complete 09/03/2024  Allergen Reaction Noted   Lisinopril Other (See Comments) 11/07/2017    Codeine Nausea And Vomiting 06/30/2016    Review of Systems:    All systems reviewed and negative except where noted in HPI.   Physical Exam:  Vital signs in last 24 hours: Temp:  [97.8 F (36.6 C)-98.7 F (37.1 C)] 98.3 F (36.8 C) (11/12 0346) Pulse Rate:  [80-90] 87 (11/12 0346) Resp:  [14-20] 20 (11/12 0346) BP: (113-165)/(69-102) 113/70 (11/12 0346) SpO2:  [94 %-100 %] 95 % (11/12 0346) Weight:  [57.2 kg-59.9 kg] 57.2 kg (11/12 0026) Last BM Date : 09/04/24 General:   Pleasant, cooperative in NAD Head:  Normocephalic and atraumatic. Eyes:   No icterus.   Conjunctiva pink. PERRLA. Ears:  Normal auditory acuity. Neck:  Supple; no masses or thyroidomegaly Lungs: Respirations even and unlabored. Lungs clear to auscultation bilaterally.   No wheezes, crackles, or  rhonchi.  Heart:  Regular rate and rhythm;  Without murmur, clicks, rubs or gallops Abdomen:  Soft, nondistended, nontender. Normal bowel sounds. No appreciable masses or hepatomegaly.  No rebound or guarding.  Rectal:  Not performed. Msk:  Symmetrical without gross deformities.    Extremities:  Without edema, cyanosis or clubbing. Neurologic:  Alert and oriented x3;  grossly normal neurologically. Skin:  Intact without significant lesions or rashes. Cervical Nodes:  No significant cervical adenopathy. Psych:  Alert and cooperative. Normal affect.  LAB RESULTS: Recent Labs    09/03/24 1233 09/04/24 0131  WBC 9.8 11.6*  HGB 13.5 12.7  HCT 45.3 39.8  PLT 189 151   BMET Recent Labs    09/03/24 1316 09/04/24 0131  NA 141 140  K 3.5 3.4*  CL 103 103  CO2 23 23  GLUCOSE 110* 113*  BUN 9 10  CREATININE 0.65 0.63  CALCIUM  9.8 9.6   LFT Recent Labs    09/03/24 1316  PROT 8.3*  ALBUMIN 4.7  AST 26  ALT 16  ALKPHOS 88  BILITOT 0.3   PT/INR No results for input(s): LABPROT, INR in the last 72 hours.  STUDIES: US  Abdomen Limited RUQ (LIVER/GB) Result Date: 09/04/2024 EXAM: Right Upper  Quadrant Abdominal Ultrasound 09/03/2024 11:35:00 PM TECHNIQUE: Real-time ultrasonography of the right upper quadrant of the abdomen was performed. COMPARISON: CT abdomen and pelvis earlier today. CLINICAL HISTORY: Right upper quadrant abdominal pain. FINDINGS: LIVER: The liver demonstrates normal echogenicity. No intrahepatic biliary ductal dilatation. No evidence of mass. BILIARY SYSTEM: No pericholecystic fluid or wall thickening. No cholelithiasis. Negative sonographic Murphy's sign. Common bile duct is within normal limits measuring 5 mm. OTHER: No right upper quadrant ascites. IMPRESSION: 1. No acute findings. Electronically signed by: Pinkie Pebbles MD 09/04/2024 12:16 AM EST RP Workstation: [id number]   CT ABDOMEN PELVIS W CONTRAST Result Date: 09/03/2024 EXAM: CT ABDOMEN AND PELVIS WITH CONTRAST 09/03/2024 06:51:48 PM TECHNIQUE: CT of the abdomen and pelvis was performed with the administration of 100 mL of iohexol  (OMNIPAQUE ) 300 MG/ML solution. Multiplanar reformatted images are provided for review. Automated exposure control, iterative reconstruction, and/or weight-based adjustment of the mA/kV was utilized to reduce the radiation dose to as low as reasonably achievable. COMPARISON: 05/16/2022. CLINICAL HISTORY: Abdominal pain, acute, nonlocalized. FINDINGS: LOWER CHEST: No acute abnormality. LIVER: The liver is unremarkable. GALLBLADDER AND BILE DUCTS: Gallbladder is unremarkable. No biliary ductal dilatation. SPLEEN: No acute abnormality. PANCREAS: No acute abnormality. ADRENAL GLANDS: No acute abnormality. KIDNEYS, URETERS AND BLADDER: No stones in the kidneys or ureters. No hydronephrosis. No perinephric or periureteral stranding. Urinary bladder is unremarkable. GI AND BOWEL: Stomach demonstrates no acute abnormality. There is no bowel obstruction. Normal appendix (image 49). PERITONEUM AND RETROPERITONEUM: Small amount of free fluid in dependent pelvis. No free air. VASCULATURE:  Atherosclerotic calcifications of the abdominal aorta and branch vessels, although patent. LYMPH NODES: No lymphadenopathy. REPRODUCTIVE ORGANS: Surgically absent uterus. BONES AND SOFT TISSUES: Mild multilevel degenerative changes in visualized spine. No focal soft tissue abnormality. IMPRESSION: 1. No acute findings in the abdomen or pelvis. Electronically signed by: Pinkie Pebbles MD 09/03/2024 07:01 PM EST RP Workstation: [id number]      Impression / Plan:   Assessment: Principal Problem:   Intractable abdominal pain Active Problems:   COPD (chronic obstructive pulmonary disease) (HCC)   Essential hypertension   Atherosclerosis of native arteries of extremity with intermittent claudication   Carotid stenosis, s/p endarterectomy   Migraines   Jasmine Acosta is a 73  y.o. y/o female with who comes in with intractable abdominal pain that started shortly before she came to the emergency room.  The patient states the pain is completely resolved and she has never had this pain prior and it has not come back since it had stopped.  Plan:  The patient will be set up for an EGD for today to rule out any peptic ulcer disease or any gastric pathology as the cause of her abdominal pain.  The patient has been told that if this is normal the hospice may consider discharging her home if her pain continues to not be present.  The patient has been explained the plan and agrees with it.  Thank you for involving me in the care of this patient.      LOS: 0 days   [name], MD, MD. NOLIA 09/04/2024, 7:24 AM,  Pager [phone] 7am-5pm  Check AMION for 5pm -7am coverage and on weekends   Note: This dictation was prepared with Dragon dictation along with smaller phrase technology. Any transcriptional errors that result from this process are unintentional.

## 2024-09-04 NOTE — Progress Notes (Signed)
 [facility] Progress Note  Patient: Jasmine Acosta    FMW:[id number]  DOA: 09/03/2024     Date of Service: the patient was seen and examined on 09/04/2024  Chief Complaint  Patient presents with   Abdominal Pain   Brief hospital course: ESTA CARMON is a 73 y.o. female with medical history significant for COPD, HTN, migraine, PVD, carotid artery stenosis s/p endarterectomy being admitted with refractory upper abdominal pain of uncertain etiology associated with nausea and vomiting which has since improved. On arrival in the ED vitals were within normal limits except for slightly elevated BP of 161/89 CBC, CMP and lipase unremarkable, UA not consistent with infectionCT abdomen and pelvis with contrast showed no acute findings Right upper quadrant ultrasound requested by family-still pending Patient was treated with IV fluids, IV Pepcid , Bentyl, Zofran  and morphine  but continued to have ongoing pain albeit without vomiting. She was treated with Dilaudid    Admission requested     Assessment and Plan:  #  Intractable abdominal pain Etiology uncertain: Differential: Gastritis, mesenteric ischemia, intestinal migraine  CT abdomen and pelvis with contrast was nonacute Can consider mesenteric CTA Continue pain control IV hydration, IV antiemetics, IV Protonix GI consulted, s/p EGD, negative for any acute findings.  Cleared for discharge. Patient is still having intractable abdominal pain, not stable to go. Continue IV fluid for hydration Continue regular diet     Migraines Continue topiramate   Atherosclerosis of native arteries of extremity with intermittent claudication Carotid artery stenosis s/p endarterectomy Continue aspirin  and rosuvastatin    Essential hypertension Continue amlodipine  Hydralazine  as needed for additional control   COPD (chronic obstructive pulmonary disease) (HCC) Not acutely exacerbated DuoNebs as needed    Body mass index is 24.63 kg/m.   Interventions:  Diet: Regular diet DVT Prophylaxis: Subcutaneous Lovenox   Advance goals of care discussion: Full code  Family Communication: family was not present at bedside, at the time of interview.  The pt provided permission to discuss medical plan with the family. Opportunity was given to ask question and all questions were answered satisfactorily.   Disposition:  Pt is from home, admitted with pain, still has abdominal pain, which precludes a safe discharge. Discharge to home, when stable, most likely tomorrow a.m.  Subjective: No significant events overnight, patient is still complaining of abdominal pain 8/10, no nausea vomiting.  Patient was scheduled for EGD and I spoke to her after EGD was done which was not significant but patient was still complaining abdominal pain and does not feel comfortable going home today so we will continue to monitor today and plan for discharge tomorrow a.m.  Physical Exam: General: NAD, lying comfortably Appear in no distress, affect appropriate Eyes: PERRLA ENT: Oral Mucosa Clear, moist  Neck: no JVD,  Cardiovascular: S1 and S2 Present, no Murmur,  Respiratory: good respiratory effort, Bilateral Air entry equal and Decreased, no Crackles, no wheezes Abdomen: BS present, Soft and epigastric tenderness,  Skin: no rashes Extremities: no Pedal edema, no calf tenderness Neurologic: without any new focal findings Gait not checked due to patient safety concerns  Vitals:   09/04/24 0026 09/04/24 0346 09/04/24 0740 09/04/24 1305  BP: (!) 152/72 113/70 (!) 131/54 (!) 146/70  Pulse: 80 87 76 75  Resp: 20 20  19   Temp: 97.8 F (36.6 C) 98.3 F (36.8 C) 98.2 F (36.8 C) (!) 97.2 F (36.2 C)  TempSrc: Oral Oral Oral Temporal  SpO2: 100% 95% 99% 97%  Weight: 57.2 kg  Height: 5' (1.524 m)       Intake/Output Summary (Last 24 hours) at 09/04/2024 1319 Last data filed at 09/04/2024 0900 Gross per 24 hour  Intake 1276.68 ml  Output --   Net 1276.68 ml   Filed Weights   09/03/24 1230 09/04/24 0026  Weight: 59.9 kg 57.2 kg    Data Reviewed: I have personally reviewed and interpreted daily labs, tele strips, imagings as discussed above. I reviewed all nursing notes, pharmacy notes, vitals, pertinent old records I have discussed plan of care as described above with RN and patient/family.  CBC: Recent Labs  Lab 09/03/24 1233 09/04/24 0131  WBC 9.8 11.6*  HGB 13.5 12.7  HCT 45.3 39.8  MCV 100.2* 91.9  PLT 189 151   Basic Metabolic Panel: Recent Labs  Lab 09/03/24 1316 09/04/24 0131 09/04/24 0840  NA 141 140  --   K 3.5 3.4*  --   CL 103 103  --   CO2 23 23  --   GLUCOSE 110* 113*  --   BUN 9 10  --   CREATININE 0.65 0.63  --   CALCIUM  9.8 9.6  --   MG  --   --  2.3  PHOS  --   --  2.9    Studies: US  Abdomen Limited RUQ (LIVER/GB) Result Date: 09/04/2024 EXAM: Right Upper Quadrant Abdominal Ultrasound 09/03/2024 11:35:00 PM TECHNIQUE: Real-time ultrasonography of the right upper quadrant of the abdomen was performed. COMPARISON: CT abdomen and pelvis earlier today. CLINICAL HISTORY: Right upper quadrant abdominal pain. FINDINGS: LIVER: The liver demonstrates normal echogenicity. No intrahepatic biliary ductal dilatation. No evidence of mass. BILIARY SYSTEM: No pericholecystic fluid or wall thickening. No cholelithiasis. Negative sonographic Murphy's sign. Common bile duct is within normal limits measuring 5 mm. OTHER: No right upper quadrant ascites. IMPRESSION: 1. No acute findings. Electronically signed by: Pinkie Pebbles MD 09/04/2024 12:16 AM EST RP Workstation: [id number]   CT ABDOMEN PELVIS W CONTRAST Result Date: 09/03/2024 EXAM: CT ABDOMEN AND PELVIS WITH CONTRAST 09/03/2024 06:51:48 PM TECHNIQUE: CT of the abdomen and pelvis was performed with the administration of 100 mL of iohexol  (OMNIPAQUE ) 300 MG/ML solution. Multiplanar reformatted images are provided for review. Automated exposure  control, iterative reconstruction, and/or weight-based adjustment of the mA/kV was utilized to reduce the radiation dose to as low as reasonably achievable. COMPARISON: 05/16/2022. CLINICAL HISTORY: Abdominal pain, acute, nonlocalized. FINDINGS: LOWER CHEST: No acute abnormality. LIVER: The liver is unremarkable. GALLBLADDER AND BILE DUCTS: Gallbladder is unremarkable. No biliary ductal dilatation. SPLEEN: No acute abnormality. PANCREAS: No acute abnormality. ADRENAL GLANDS: No acute abnormality. KIDNEYS, URETERS AND BLADDER: No stones in the kidneys or ureters. No hydronephrosis. No perinephric or periureteral stranding. Urinary bladder is unremarkable. GI AND BOWEL: Stomach demonstrates no acute abnormality. There is no bowel obstruction. Normal appendix (image 49). PERITONEUM AND RETROPERITONEUM: Small amount of free fluid in dependent pelvis. No free air. VASCULATURE: Atherosclerotic calcifications of the abdominal aorta and branch vessels, although patent. LYMPH NODES: No lymphadenopathy. REPRODUCTIVE ORGANS: Surgically absent uterus. BONES AND SOFT TISSUES: Mild multilevel degenerative changes in visualized spine. No focal soft tissue abnormality. IMPRESSION: 1. No acute findings in the abdomen or pelvis. Electronically signed by: Pinkie Pebbles MD 09/03/2024 07:01 PM EST RP Workstation: [id number]    Scheduled Meds:  amLODipine   5 mg Oral Daily   [START ON 09/05/2024] aspirin   81 mg Oral Daily   [START ON 09/05/2024] clopidogrel   75 mg Oral Q breakfast   [MAR Hold] pantoprazole  40 mg Oral BID   rosuvastatin   40 mg Oral QPM   [MAR Hold] topiramate  50 mg Oral BID   Continuous Infusions:  sodium chloride  Stopped (09/04/24 0558)   sodium chloride  20 mL/hr at 09/04/24 1313   [MAR Hold] promethazine (PHENERGAN) injection (IM or IVPB)     PRN Meds: [MAR Hold] acetaminophen  **OR** [MAR Hold] acetaminophen , [MAR Hold] hydrALAZINE , [MAR Hold] HYDROcodone -acetaminophen , [MAR Hold]  morphine  injection,  [MAR Hold] ondansetron  **OR** [MAR Hold] ondansetron  (ZOFRAN ) IV, [MAR Hold] promethazine (PHENERGAN) injection (IM or IVPB)  Time spent: 35 minutes  Author: ELVAN SOR. MD [facility] 09/04/2024 1:19 PM  To reach On-call, see care teams to locate the attending and reach out to them via [email or web address]. If 7PM-7AM, please contact night-coverage If you still have difficulty reaching the attending provider, please page the [facility] (Director on Call) for [facility] on amion for assistance.

## 2024-09-04 NOTE — ED Notes (Signed)
 Transport paged @ [phone]

## 2024-09-04 NOTE — Op Note (Signed)
 [facility] Gastroenterology Patient Name: Jasmine Acosta Procedure Date: 09/04/2024 1:41 PM MRN: [id number] Account #: [id number] Date of Birth: 1951/10/08 Admit Type: Inpatient Age: 73 Room: [facility] ENDO ROOM 2 Gender: Female Note Status: Finalized Instrument Name: Endoscope [id number] Procedure:             Upper GI endoscopy Indications:           Abdominal pain Providers:             [name] MD, MD Referring MD:          Layman ORN. Lenon MD, MD (Referring MD) Medicines:             Propofol  per Anesthesia Complications:         No immediate complications. Procedure:             Pre-Anesthesia Assessment:                        - Prior to the procedure, a History and Physical was                         performed, and patient medications and allergies were                         reviewed. The patient's tolerance of previous                         anesthesia was also reviewed. The risks and benefits                         of the procedure and the sedation options and risks                         were discussed with the patient. All questions were                         answered, and informed consent was obtained. Prior                         Anticoagulants: The patient has taken no anticoagulant                         or antiplatelet agents. ASA Grade Assessment: II - A                         patient with mild systemic disease. After reviewing                         the risks and benefits, the patient was deemed in                         satisfactory condition to undergo the procedure.                        After obtaining informed consent, the endoscope was                         passed under direct vision. Throughout the procedure,  the patient's blood pressure, pulse, and oxygen                         saturations were monitored continuously. The Endoscope                         was introduced through the mouth, and  advanced to the                         second part of duodenum. The upper GI endoscopy was                         accomplished without difficulty. The patient tolerated                         the procedure well. Findings:      The examined esophagus was normal.      A single 1 mm angiodysplastic lesion with no bleeding was found in the       gastric body.      The examined duodenum was normal. Impression:            - Normal esophagus.                        - A single non-bleeding angiodysplastic lesion in the                         stomach.                        - Normal examined duodenum.                        - No specimens collected. Recommendation:        - Return patient to hospital ward for ongoing care.                        - Resume previous diet.                        - Continue present medications. Procedure Code(s):     --- Professional ---                        [phone], Esophagogastroduodenoscopy, flexible,                         transoral; diagnostic, including collection of                         specimen(s) by brushing or washing, when performed                         (separate procedure) Diagnosis Code(s):     --- Professional ---                        R10.9, Unspecified abdominal pain CPT copyright 2022 American Medical Association. All rights reserved. The codes documented in this report are preliminary and upon coder review may  be revised to meet current compliance requirements. [name] MD, MD 09/04/2024 1:55:58 PM This report  has been signed electronically. Number of Addenda: 0 Note Initiated On: 09/04/2024 1:41 PM Estimated Blood Loss:  Estimated blood loss: none.      [facility]

## 2024-09-05 ENCOUNTER — Other Ambulatory Visit: Payer: Self-pay

## 2024-09-05 ENCOUNTER — Encounter (HOSPITAL_BASED_OUTPATIENT_CLINIC_OR_DEPARTMENT_OTHER): Payer: Self-pay | Admitting: Emergency Medicine

## 2024-09-05 ENCOUNTER — Encounter: Payer: Self-pay | Admitting: Oncology

## 2024-09-05 DIAGNOSIS — R109 Unspecified abdominal pain: Secondary | ICD-10-CM | POA: Insufficient documentation

## 2024-09-05 DIAGNOSIS — R112 Nausea with vomiting, unspecified: Secondary | ICD-10-CM | POA: Diagnosis not present

## 2024-09-05 DIAGNOSIS — R000 Tachycardia, unspecified: Secondary | ICD-10-CM | POA: Diagnosis not present

## 2024-09-05 DIAGNOSIS — Z7982 Long term (current) use of aspirin: Secondary | ICD-10-CM | POA: Diagnosis not present

## 2024-09-05 DIAGNOSIS — Z7901 Long term (current) use of anticoagulants: Secondary | ICD-10-CM | POA: Diagnosis not present

## 2024-09-05 LAB — COMPREHENSIVE METABOLIC PANEL WITH GFR
ALT: 14 U/L (ref 0–44)
AST: 27 U/L (ref 15–41)
Albumin: 4.4 g/dL (ref 3.5–5.0)
Alkaline Phosphatase: 84 U/L (ref 38–126)
Anion gap: 15 (ref 5–15)
BUN: 26 mg/dL — ABNORMAL HIGH (ref 8–23)
CO2: 21 mmol/L — ABNORMAL LOW (ref 22–32)
Calcium: 10.1 mg/dL (ref 8.9–10.3)
Chloride: 101 mmol/L (ref 98–111)
Creatinine, Ser: 0.91 mg/dL (ref 0.44–1.00)
GFR, Estimated: >60 mL/min (ref >60)
Glucose, Bld: 138 mg/dL — ABNORMAL HIGH (ref 70–99)
Potassium: 3.4 mmol/L — ABNORMAL LOW (ref 3.5–5.1)
Sodium: 137 mmol/L (ref 135–145)
Total Bilirubin: 0.5 mg/dL (ref 0.0–1.2)
Total Protein: 8.3 g/dL — ABNORMAL HIGH (ref 6.5–8.1)

## 2024-09-05 LAB — CBC
HCT: 42.2 % (ref 36.0–46.0)
HCT: 43 % (ref 36.0–46.0)
Hemoglobin: 13.6 g/dL (ref 12.0–15.0)
Hemoglobin: 14.3 g/dL (ref 12.0–15.0)
MCH: 29.1 pg (ref 26.0–34.0)
MCH: 29.9 pg (ref 26.0–34.0)
MCHC: 32.2 g/dL (ref 30.0–36.0)
MCHC: 33.3 g/dL (ref 30.0–36.0)
MCV: 90.4 fL (ref 80.0–100.0)
MCV: 89.8 fL (ref 80.0–100.0)
Platelets: 173 K/uL (ref 150–400)
Platelets: 212 K/uL (ref 150–400)
RBC: 4.67 MIL/uL (ref 3.87–5.11)
RBC: 4.79 MIL/uL (ref 3.87–5.11)
RDW: 12.1 % (ref 11.5–15.5)
RDW: 12.4 % (ref 11.5–15.5)
WBC: 13.1 K/uL — ABNORMAL HIGH (ref 4.0–10.5)
WBC: 14.1 K/uL — ABNORMAL HIGH (ref 4.0–10.5)
nRBC: 0 % (ref 0.0–0.2)
nRBC: 0 % (ref 0.0–0.2)

## 2024-09-05 LAB — LIPASE, BLOOD: Lipase: 42 U/L (ref 11–51)

## 2024-09-05 LAB — BASIC METABOLIC PANEL WITH GFR
Anion gap: 14 (ref 5–15)
BUN: 15 mg/dL (ref 8–23)
CO2: 20 mmol/L — ABNORMAL LOW (ref 22–32)
Calcium: 9.6 mg/dL (ref 8.9–10.3)
Chloride: 103 mmol/L (ref 98–111)
Creatinine, Ser: 0.74 mg/dL (ref 0.44–1.00)
GFR, Estimated: >60 mL/min (ref >60)
Glucose, Bld: 111 mg/dL — ABNORMAL HIGH (ref 70–99)
Potassium: 3.3 mmol/L — ABNORMAL LOW (ref 3.5–5.1)
Sodium: 137 mmol/L (ref 135–145)

## 2024-09-05 LAB — PHOSPHORUS: Phosphorus: 2.8 mg/dL (ref 2.5–4.6)

## 2024-09-05 LAB — MAGNESIUM: Magnesium: 2.4 mg/dL (ref 1.7–2.4)

## 2024-09-05 MED ORDER — VITAMIN B-12 1000 MCG PO TABS: 1000.0000 ug | ORAL_TABLET | Freq: Every day | ORAL | Status: DC

## 2024-09-05 MED ORDER — CYANOCOBALAMIN 1000 MCG PO TABS
1000.0000 ug | ORAL_TABLET | Freq: Every day | ORAL | 0 refills | Status: AC
  Filled 2024-09-05: qty 90, 90d supply, fill #0

## 2024-09-05 MED ORDER — CYANOCOBALAMIN 1000 MCG/ML IJ SOLN
1000.0000 ug | Freq: Once | INTRAMUSCULAR | Status: AC
  Administered 2024-09-05: 1000 ug via INTRAMUSCULAR
  Filled 2024-09-05: qty 1

## 2024-09-05 NOTE — ED Triage Notes (Addendum)
 Pt to ED from home c/o abd pain, vomiting, and weakness for several days.  Was seen in ED 2 days ago for same and admitted, discharged earlier today dx with cannabis induced hyperemesis.  States felt this way during hospital stay and at discharge.  States vomiting has been dark colored.  States unable to keep food or drink down.  Denies urinary changes.  Pt does take Plavix .

## 2024-09-05 NOTE — Plan of Care (Signed)

## 2024-09-05 NOTE — Discharge Summary (Signed)
 [facility] Discharge Summary   Patient: Jasmine Acosta FMW:[id number]  PCP: Jasmine Acosta  Date of admission: 09/03/2024   Date of discharge:  09/05/2024     Discharge Diagnoses:  Principal Problem:   Intractable abdominal pain Active Problems:   COPD (chronic obstructive pulmonary disease) (HCC)   Essential hypertension   Atherosclerosis of native arteries of extremity with intermittent claudication   Carotid stenosis, s/p endarterectomy   Migraines   Admitted From: Home Disposition:  Home   Recommendations for Outpatient Follow-up:  Follow-up with PCP in 1 week, monitor BP at home and resume amlodipine  if systolic BP greater than 130 mmHg. Repeat vitamin B12 level after 3 to 6 months. UDS positive for marijuana, drug abuse abstinence counseling done. Follow up LABS/TEST:  As above   Follow-up Information     Jasmine Acosta Follow up in 1 week(s).   Specialty: Internal Medicine Contact information: [address] [facility] [place] [address] [phone]                Diet recommendation: Cardiac diet  Activity: The patient is advised to gradually reintroduce usual activities, as tolerated  Discharge Condition: stable  Code Status: Full code   History of present illness: As per the H and P dictated on admission.  Hospital Course:  Jasmine Acosta is a 73 y.o. female with medical history significant for COPD, HTN, migraine, PVD, carotid artery stenosis s/p endarterectomy being admitted with refractory upper abdominal pain of uncertain etiology associated with nausea and vomiting which has since improved. On arrival in the ED vitals were within normal limits except for slightly elevated BP of 161/89 CBC, CMP and lipase unremarkable, UA not consistent with infectionCT abdomen and pelvis with contrast showed no acute findings Right upper quadrant ultrasound requested by family-still pending Patient was  treated with IV fluids, IV Pepcid , Bentyl, Zofran  and morphine  but continued to have ongoing pain albeit without vomiting. She was treated with Dilaudid    Admission requested    Assessment and Plan:   #  Intractable abdominal pain. Resolved  Etiology uncertain: Most likely secondary to marijuana use CT abdomen and pelvis with contrast was nonacute S/p pain control, IV hydration, IV antiemetics, IV Protonix GI consulted, s/p EGD, negative for any acute findings.  Cleared for discharge. 11/12 Patient was still having intractable abdominal pain, not stable to go. So continued IV fluid for hydration.  Diet advanced to regular. 11/13 today patient is feeling better, abdominal pain resolved.  Agreed to be discharged.     # Hypokalemia, potassium repleted with  # Migraines: Continue topiramate   # Atherosclerosis of native arteries of extremity with intermittent claudication Carotid artery stenosis s/p endarterectomy Continue aspirin  and rosuvastatin    # Essential hypertension Continue amlodipine  Hydralazine  as needed for additional control   # COPD (chronic obstructive pulmonary disease) (HCC) Not acutely exacerbated DuoNebs as needed  # Vitamin B12 deficiency: B12 level 173, goal 400.  S/p  vitamin B12 1000 mcg IM injection x 1 dose, followed by oral supplement on discharge for 3 months.  Follow-up PCP to repeat vitamin B12 level after 3 to 6 months.  # Marijuana use disorder.  Drug Bellaphen-S counseling done UDS positive for THC  Body mass index is 24.63 kg/m.  Nutrition Interventions:  - Patient was instructed, not to drive, operate heavy machinery, perform activities at heights, swimming or participation in water activities or provide baby sitting services while on Pain, Sleep  and Anxiety Medications; until her outpatient Physician has advised to do so again.  - Also recommended to not to take more than prescribed Pain, Sleep and Anxiety Medications.  Patient was  ambulatory without any assistance.  On the day of the discharge the patient's vitals were stable, and no other acute medical condition were reported by patient. the patient was felt safe to be discharge at Home.  Consultants: GI Procedures: s/p EGD  Discharge Exam: General: Appear in no distress, Oral Mucosa Clear, moist. Cardiovascular: S1 and S2 Present, no Murmur, Respiratory: normal respiratory effort, Bilateral Air entry present and no Crackles, no wheezes Abdomen: Bowel Sound present, Soft and no tenderness. Extremities: no Pedal edema, no calf tenderness Neurology: alert and oriented to time, place, and person affect appropriate.  Filed Weights   09/03/24 1230 09/04/24 0026  Weight: 59.9 kg 57.2 kg   Vitals:   09/05/24 0815 09/05/24 0931  BP: 97/70 104/70  Pulse: (!) 106 100  Resp: 18   Temp: (!) 97.5 F (36.4 C)   SpO2: 97% 99%    DISCHARGE MEDICATION: Allergies as of 09/05/2024       Reactions   Lisinopril Other (See Comments)   Other reaction(s): Angioedema Of tongue only   Codeine Nausea And Vomiting        Medication List     PAUSE taking these medications    amLODipine  5 MG tablet Wait to take this until your doctor or other care provider tells you to start again. Commonly known as: NORVASC  Take 5 mg by mouth daily.       STOP taking these medications    guaiFENesin  600 MG 12 hr tablet Commonly known as: MUCINEX    meloxicam 15 MG tablet Commonly known as: MOBIC   potassium chloride  10 MEQ tablet Commonly known as: KLOR-CON  M   Ubrelvy 50 MG Tabs Generic drug: Ubrogepant       TAKE these medications    aspirin  EC 81 MG tablet Take 1 tablet (81 mg total) by mouth daily.   clopidogrel  75 MG tablet Commonly known as: PLAVIX  Take 1 tablet (75 mg total) by mouth daily with breakfast.   cyanocobalamin 1000 MCG tablet Take 1 tablet (1,000 mcg total) by mouth daily. Start taking on: September 06, 2024   Iron (Ferrous Sulfate) 325  (65 Fe) MG Tabs Take 65 mg/kg of iron by mouth.   multivitamin capsule Take 1 capsule by mouth daily.   rosuvastatin  40 MG tablet Commonly known as: CRESTOR  Take 40 mg by mouth daily.   topiramate 50 MG tablet Commonly known as: TOPAMAX Take 1 tablet by mouth 2 (two) times daily.       Allergies  Allergen Reactions   Lisinopril Other (See Comments)    Other reaction(s): Angioedema Of tongue only   Codeine Nausea And Vomiting   Discharge Instructions     Call Acosta for:  difficulty breathing, headache or visual disturbances   Complete by: As directed    Call Acosta for:  extreme fatigue   Complete by: As directed    Call Acosta for:  persistant dizziness or light-headedness   Complete by: As directed    Call Acosta for:  persistant nausea and vomiting   Complete by: As directed    Call Acosta for:  severe uncontrolled pain   Complete by: As directed    Call Acosta for:  temperature >100.4   Complete by: As directed    Diet general   Complete by: As directed  Discharge instructions   Complete by: As directed    Follow-up with PCP in 1 week, monitor BP at home and resume amlodipine  if systolic BP greater than 130 mmHg. Repeat vitamin B12 level after 3 to 6 months. UDS positive for marijuana, drug abuse abstinence counseling done.   Increase activity slowly   Complete by: As directed        The results of significant diagnostics from this hospitalization (including imaging, microbiology, ancillary and laboratory) are listed below for reference.    Significant Diagnostic Studies: US  Abdomen Limited RUQ (LIVER/GB) Result Date: 09/04/2024 EXAM: Right Upper Quadrant Abdominal Ultrasound 09/03/2024 11:35:00 PM TECHNIQUE: Real-time ultrasonography of the right upper quadrant of the abdomen was performed. COMPARISON: CT abdomen and pelvis earlier today. CLINICAL HISTORY: Right upper quadrant abdominal pain. FINDINGS: LIVER: The liver demonstrates normal echogenicity. No intrahepatic biliary  ductal dilatation. No evidence of mass. BILIARY SYSTEM: No pericholecystic fluid or wall thickening. No cholelithiasis. Negative sonographic Murphy's sign. Common bile duct is within normal limits measuring 5 mm. OTHER: No right upper quadrant ascites. IMPRESSION: 1. No acute findings. Electronically signed by: Pinkie Pebbles Acosta 09/04/2024 12:16 AM EST RP Workstation: [id number]   CT ABDOMEN PELVIS W CONTRAST Result Date: 09/03/2024 EXAM: CT ABDOMEN AND PELVIS WITH CONTRAST 09/03/2024 06:51:48 PM TECHNIQUE: CT of the abdomen and pelvis was performed with the administration of 100 mL of iohexol  (OMNIPAQUE ) 300 MG/ML solution. Multiplanar reformatted images are provided for review. Automated exposure control, iterative reconstruction, and/or weight-based adjustment of the mA/kV was utilized to reduce the radiation dose to as low as reasonably achievable. COMPARISON: 05/16/2022. CLINICAL HISTORY: Abdominal pain, acute, nonlocalized. FINDINGS: LOWER CHEST: No acute abnormality. LIVER: The liver is unremarkable. GALLBLADDER AND BILE DUCTS: Gallbladder is unremarkable. No biliary ductal dilatation. SPLEEN: No acute abnormality. PANCREAS: No acute abnormality. ADRENAL GLANDS: No acute abnormality. KIDNEYS, URETERS AND BLADDER: No stones in the kidneys or ureters. No hydronephrosis. No perinephric or periureteral stranding. Urinary bladder is unremarkable. GI AND BOWEL: Stomach demonstrates no acute abnormality. There is no bowel obstruction. Normal appendix (image 49). PERITONEUM AND RETROPERITONEUM: Small amount of free fluid in dependent pelvis. No free air. VASCULATURE: Atherosclerotic calcifications of the abdominal aorta and branch vessels, although patent. LYMPH NODES: No lymphadenopathy. REPRODUCTIVE ORGANS: Surgically absent uterus. BONES AND SOFT TISSUES: Mild multilevel degenerative changes in visualized spine. No focal soft tissue abnormality. IMPRESSION: 1. No acute findings in the abdomen or pelvis.  Electronically signed by: Pinkie Pebbles Acosta 09/03/2024 07:01 PM EST RP Workstation: [id number]    Microbiology: No results found for this or any previous visit (from the past 240 hours).   Labs: CBC: Recent Labs  Lab 09/03/24 1233 09/04/24 0131 09/05/24 0422  WBC 9.8 11.6* 13.1*  HGB 13.5 12.7 13.6  HCT 45.3 39.8 42.2  MCV 100.2* 91.9 90.4  PLT 189 151 173   Basic Metabolic Panel: Recent Labs  Lab 09/03/24 1316 09/04/24 0131 09/04/24 0840 09/05/24 0422  NA 141 140  --  137  K 3.5 3.4*  --  3.3*  CL 103 103  --  103  CO2 23 23  --  20*  GLUCOSE 110* 113*  --  111*  BUN 9 10  --  15  CREATININE 0.65 0.63  --  0.74  CALCIUM  9.8 9.6  --  9.6  MG  --   --  2.3 2.4  PHOS  --   --  2.9 2.8   Liver Function Tests: Recent Labs  Lab 09/03/24 1316  AST 26  ALT 16  ALKPHOS 88  BILITOT 0.3  PROT 8.3*  ALBUMIN 4.7   Recent Labs  Lab 09/03/24 1316  LIPASE 27   No results for input(s): AMMONIA in the last 168 hours. Cardiac Enzymes: No results for input(s): CKTOTAL, CKMB, CKMBINDEX, TROPONINI in the last 168 hours. BNP (last 3 results) No results for input(s): BNP in the last 8760 hours. CBG: No results for input(s): GLUCAP in the last 168 hours.  Time spent: 35 minutes  Signed:  Elvan Sor  [facility] 09/05/2024 1:00 PM

## 2024-09-06 ENCOUNTER — Emergency Department (HOSPITAL_BASED_OUTPATIENT_CLINIC_OR_DEPARTMENT_OTHER)

## 2024-09-06 ENCOUNTER — Emergency Department (HOSPITAL_BASED_OUTPATIENT_CLINIC_OR_DEPARTMENT_OTHER)
Admission: EM | Admit: 2024-09-06 | Discharge: 2024-09-06 | Disposition: A | Discharge status: Home / Self Care | Source: Ambulatory Visit | Attending: Emergency Medicine | Admitting: Emergency Medicine

## 2024-09-06 DIAGNOSIS — R0902 Hypoxemia: Secondary | ICD-10-CM | POA: Diagnosis not present

## 2024-09-06 DIAGNOSIS — R112 Nausea with vomiting, unspecified: Secondary | ICD-10-CM

## 2024-09-06 DIAGNOSIS — I700 Atherosclerosis of aorta: Secondary | ICD-10-CM | POA: Diagnosis not present

## 2024-09-06 DIAGNOSIS — J9811 Atelectasis: Secondary | ICD-10-CM | POA: Diagnosis not present

## 2024-09-06 LAB — URINALYSIS, ROUTINE W REFLEX MICROSCOPIC
Bilirubin Urine: NEGATIVE
Glucose, UA: NEGATIVE mg/dL
Ketones, ur: 15 mg/dL — AB
Nitrite: NEGATIVE
Protein, ur: 100 mg/dL — AB
Specific Gravity, Urine: 1.024 (ref 1.005–1.030)
pH: 6 (ref 5.0–8.0)

## 2024-09-06 MED ORDER — LACTATED RINGERS IV BOLUS
1000.0000 mL | Freq: Once | INTRAVENOUS | Status: AC
  Administered 2024-09-06: 1000 mL via INTRAVENOUS

## 2024-09-06 MED ORDER — DIPHENHYDRAMINE HCL 50 MG/ML IJ SOLN
25.0000 mg | Freq: Once | INTRAMUSCULAR | Status: AC
  Administered 2024-09-06: 25 mg via INTRAVENOUS
  Filled 2024-09-06: qty 1

## 2024-09-06 MED ORDER — PROCHLORPERAZINE EDISYLATE 10 MG/2ML IJ SOLN
10.0000 mg | Freq: Once | INTRAMUSCULAR | Status: AC
  Administered 2024-09-06: 10 mg via INTRAVENOUS
  Filled 2024-09-06: qty 2

## 2024-09-06 MED ORDER — PROCHLORPERAZINE MALEATE 10 MG PO TABS: 10.0000 mg | ORAL_TABLET | Freq: Two times a day (BID) | ORAL | 0 refills | Status: AC | PRN

## 2024-09-06 MED ORDER — DEXAMETHASONE SOD PHOSPHATE PF 10 MG/ML IJ SOLN
10.0000 mg | Freq: Once | INTRAMUSCULAR | Status: AC
  Administered 2024-09-06: 10 mg via INTRAVENOUS

## 2024-09-06 NOTE — ED Provider Notes (Signed)
  Physical Exam  BP 122/71   Pulse 90   Temp 98.4 F (36.9 C) (Oral)   Resp 15   Ht 5' (1.524 m)   Wt 57.2 kg   SpO2 90%   BMI 24.61 kg/m   Physical Exam  Procedures  Procedures  ED Course / MDM   Clinical Course as of 09/06/24 1136  Fri Sep 06, 2024  0807 Assumed care from Dr Lorette. 73 yo F with recent admission for abdominal pain who presented with abdominal pain and n/v. Was recently admitted to [place] and dc'd yesterday for suspected cannabinoid hyperemesis. Did have CT scan and right upper quadrant ultrasound without acute abnormality. Was still actively vomiting when she was dc'd. Has epigastric abdominal pain. WBC 14. Diffusely tender but not felt to need repeat CT. Has drank water. Still drowsy from compazine and benadryl.   [RP]  1019 Patient reassessed and is feeling back to baseline. Tolerating PO. Requesting to go home. Was placed on oxygen but lungs are CTAB. Suspect that it could have been OSA or the medications. Have taken her off the oxygen now.  Chest x-ray ordered. [RP]  1134 Chest x-ray does not show acute abnormality.  She satting well on room air.  Not subjectively shortness of breath or having any respiratory symptoms.  Suspect the drop in her oxygen saturation that was transient was likely due to her sleeping.  Still requesting to go home and has not had any recurrence of her nausea and vomiting.  Will discharge her home with prescription of Compazine to take and instructions to follow-up with her PCP [RP]    Clinical Course User Index [RP] Yolande Lamar BROCKS, MD   Medical Decision Making Amount and/or Complexity of Data Reviewed Labs: ordered. Radiology: ordered.  Risk Prescription drug management.      Yolande Lamar BROCKS, MD 09/06/24 1136

## 2024-09-06 NOTE — ED Notes (Signed)
 Pt given water for PO challenge.

## 2024-09-06 NOTE — ED Notes (Signed)
 Pt placed on 2L [place] while sleeping d/t oxygen sat down to 89%.  Up to 98% on 2L.

## 2024-09-06 NOTE — ED Notes (Signed)
 Spoke with lab about urine culture add on.

## 2024-09-06 NOTE — Discharge Instructions (Signed)
 You were seen for your nausea and vomiting in the emergency department.   At home, please take Compazine prescribed you.    Check your MyChart online for the results of any tests that had not resulted by the time you left the emergency department.   Follow-up with your primary doctor in 2-3 days regarding your visit.    Return immediately to the emergency department if you experience any of the following: Severe abdominal pain, vomiting despite the medicine, or any other concerning symptoms.    Thank you for visiting our Emergency Department. It was a pleasure taking care of you today.

## 2024-09-06 NOTE — ED Notes (Signed)
 Pt verbalized understanding of dc instructions.

## 2024-09-06 NOTE — ED Provider Notes (Addendum)
 [place] EMERGENCY DEPARTMENT AT [facility] Provider Note   CSN: [id number] Arrival date & time: 09/05/24  2105     Patient presents with: Vomiting   Jasmine Acosta is a 73 y.o. female.   The patient is a woman who presents with persistent vomiting and abdominal pain. The symptoms began on Tuesday, following a week-long stay at a relative's house, and have been ongoing since. The vomiting occurs with any oral intake, including attempts to drink water, and is described as producing brown emesis. The patient denies fever, diarrhea, or sick contacts. She has a history of migraines, which began in her twenties, resolved, and then recurred in her later years. She was recently discharged from the hospital where a CT scan and endoscopy were performed, revealing a minor vascular issue in the stomach but no significant findings. She was not prescribed any antiemetic medications upon discharge, only vitamin B12. The patient has no history of similar gastrointestinal symptoms and denies any new medications or exposures. History was obtained from the patient and her great-niece.          Prior to Admission medications   Medication Sig Start Date End Date Taking? Authorizing Provider  prochlorperazine (COMPAZINE) 10 MG tablet Take 1 tablet (10 mg total) by mouth 2 (two) times daily as needed for nausea or vomiting. 09/06/24  Yes Yolande Lamar BROCKS, MD  amLODipine  (NORVASC ) 5 MG tablet Take 5 mg by mouth daily.  01/24/18   [provider]  aspirin  EC 81 MG tablet Take 1 tablet (81 mg total) by mouth daily. 11/05/18   Marea Selinda RAMAN, MD  clopidogrel  (PLAVIX ) 75 MG tablet Take 1 tablet (75 mg total) by mouth daily with breakfast. 12/07/18   Brown, Fallon E, NP  cyanocobalamin 1000 MCG tablet Take 1 tablet (1,000 mcg total) by mouth daily. 09/06/24 12/05/24  Von Bellis, MD  Iron, Ferrous Sulfate, 325 (65 Fe) MG TABS Take 65 mg/kg of iron by mouth.    [provider]   Multiple Vitamin (MULTIVITAMIN) capsule Take 1 capsule by mouth daily.    [provider]  rosuvastatin  (CRESTOR ) 40 MG tablet Take 40 mg by mouth daily.    [provider]  topiramate (TOPAMAX) 50 MG tablet Take 1 tablet by mouth 2 (two) times daily. 02/07/23   [provider]    Allergies: Lisinopril and Codeine    Review of Systems  Updated Vital Signs BP 120/74   Pulse 89   Temp 98.4 F (36.9 C) (Oral)   Resp 16   Ht 5' (1.524 m)   Wt 57.2 kg   SpO2 90%   BMI 24.61 kg/m   Physical Exam Vitals and nursing note reviewed.  Constitutional:      Appearance: She is well-developed.  HENT:     Head: Normocephalic and atraumatic.  Cardiovascular:     Rate and Rhythm: Normal rate and regular rhythm.  Pulmonary:     Effort: No respiratory distress.     Breath sounds: No stridor.  Abdominal:     General: There is no distension.  Musculoskeletal:     Cervical back: Normal range of motion.  Skin:    General: Skin is warm and dry.  Neurological:     General: No focal deficit present.     Mental Status: She is alert.     (all labs ordered are listed, but only abnormal results are displayed) Labs Reviewed  URINE CULTURE - Abnormal; Notable for the following components:  Result Value   Culture   (*)    Value: <10,000 COLONIES/mL INSIGNIFICANT GROWTH Performed at [facility], [address], [place], [place] [address]    All other components within normal limits  COMPREHENSIVE METABOLIC PANEL WITH GFR - Abnormal; Notable for the following components:   Potassium 3.4 (*)    CO2 21 (*)    Glucose, Bld 138 (*)    BUN 26 (*)    Total Protein 8.3 (*)    All other components within normal limits  CBC - Abnormal; Notable for the following components:   WBC 14.1 (*)    All other components within normal limits  URINALYSIS, ROUTINE W REFLEX MICROSCOPIC - Abnormal; Notable for the following components:   Hgb urine dipstick MODERATE (*)     Ketones, ur 15 (*)    Protein, ur 100 (*)    Leukocytes,Ua SMALL (*)    Bacteria, UA RARE (*)    All other components within normal limits  LIPASE, BLOOD    EKG: EKG Interpretation Date/Time:  Thursday September 05 2024 21:36:42 EST Ventricular Rate:  111 PR Interval:  146 QRS Duration:  74 QT Interval:  328 QTC Calculation: 446 R Axis:   71  Text Interpretation: Sinus tachycardia Right atrial enlargement Cannot rule out Anterior infarct (cited on or before 11-Jun-2019) Abnormal ECG When compared with ECG of 11-Jun-2019 12:10, Questionable change in initial forces of Anterior leads ST now depressed in Inferior leads Nonspecific T wave abnormality, improved in Lateral leads Confirmed by Yolande Charleston [phone]) on 09/06/2024 8:34:38 AM  Radiology: DG Chest Portable 1 View Result Date: 09/06/2024 EXAM: 1 VIEW(S) XRAY OF THE CHEST 09/06/2024 11:11:35 AM COMPARISON: None available. CLINICAL HISTORY: hypoxia, n/v FINDINGS: LUNGS AND PLEURA: Streaky bibasilar atelectasis. No pleural effusion. No pneumothorax. HEART AND MEDIASTINUM: Aortic atherosclerosis. BONES AND SOFT TISSUES: No acute osseous abnormality. Multilevel thoracic osteophytosis. IMPRESSION: 1. No acute cardiopulmonary abnormality. Electronically signed by: Rogelia Myers MD 09/06/2024 11:26 AM EST RP Workstation: [id number]     Procedures   Medications Ordered in the ED  prochlorperazine (COMPAZINE) injection 10 mg (10 mg Intravenous Given 09/06/24 0343)  diphenhydrAMINE (BENADRYL) injection 25 mg (25 mg Intravenous Given 09/06/24 0343)  dexamethasone  (DECADRON ) injection 10 mg (10 mg Intravenous Given 09/06/24 0343)  lactated ringers  bolus 1,000 mL ( Intravenous Stopped 09/06/24 0457)  lactated ringers  bolus 1,000 mL (0 mLs Intravenous Stopped 09/06/24 0915)    Clinical Course as of 09/08/24 0804  Fri Sep 06, 2024  0807 Assumed care from Dr Lorette. 73 yo F with recent admission for abdominal pain who presented with  abdominal pain and n/v. Was recently admitted to [place] and dc'd yesterday for suspected cannabinoid hyperemesis. Did have CT scan and right upper quadrant ultrasound without acute abnormality. Was still actively vomiting when she was dc'd. Has epigastric abdominal pain. WBC 14. Diffusely tender but not felt to need repeat CT. Has drank water. Still drowsy from compazine and benadryl.   [RP]  1019 Patient reassessed and is feeling back to baseline. Tolerating PO. Requesting to go home. Was placed on oxygen but lungs are CTAB. Suspect that it could have been OSA or the medications. Have taken her off the oxygen now.  Chest x-ray ordered. [RP]  1134 Chest x-ray does not show acute abnormality.  She satting well on room air.  Not subjectively shortness of breath or having any respiratory symptoms.  Suspect the drop in her oxygen saturation that was transient was likely due to  her sleeping.  Still requesting to go home and has not had any recurrence of her nausea and vomiting.  Will discharge her home with prescription of Compazine to take and instructions to follow-up with her PCP [RP]    Clinical Course User Index [RP] Yolande Lamar BROCKS, MD                                 Medical Decision Making Amount and/or Complexity of Data Reviewed Labs: ordered. Radiology: ordered.  Risk Prescription drug management.   The patient presented to the emergency department with persistent abdominal pain and vomiting, unable to keep anything down. She had been discharged from the hospital the previous day with similar symptoms, which had not improved. A CT scan and endoscopy performed during her prior admission showed no significant abnormalities, except for a small vessel finding in the stomach. Her lab tests today showed a slightly low potassium level, but were otherwise unremarkable. Given her history of migraines, the possibility of an abdominal migraine was considered. A migraine cocktail, including a nausea  medication, was administered to address her symptoms.  Differential Diagnosis: Differential diagnosis includes but is not limited to: abdominal migraine, gastroenteritis, gastritis, peptic ulcer disease, and cyclic vomiting syndrome.  Labs reassuring.  P.o. challenge initiated.  Doing well with fluids.  Will attempt solids.  Patient with persistent improvement in her symptoms.  Suspect likely abdominal migraine.  No indication for further workup.    Still pretty sleepy from medications.  Care transferred pending reevaluation for discharge.   Lorette Mayo, MD 09/08/24 [phone]

## 2024-09-07 LAB — URINE CULTURE: Culture: <10000 — AB

## 2024-09-10 ENCOUNTER — Other Ambulatory Visit: Payer: Self-pay

## 2024-09-10 ENCOUNTER — Ambulatory Visit
Admission: RE | Admit: 2024-09-10 | Discharge: 2024-09-10 | Disposition: A | Discharge status: Home / Self Care | Source: Ambulatory Visit

## 2024-09-10 ENCOUNTER — Encounter (HOSPITAL_COMMUNITY): Payer: Self-pay | Admitting: Emergency Medicine

## 2024-09-10 ENCOUNTER — Inpatient Hospital Stay (HOSPITAL_COMMUNITY)

## 2024-09-10 ENCOUNTER — Inpatient Hospital Stay (HOSPITAL_COMMUNITY)
Admission: EM | Admit: 2024-09-10 | Discharge: 2024-09-20 | DRG: 335 | Disposition: A | Discharge status: Home / Self Care | Attending: Family Medicine | Admitting: Family Medicine

## 2024-09-10 DIAGNOSIS — Z9861 Coronary angioplasty status: Secondary | ICD-10-CM

## 2024-09-10 DIAGNOSIS — I739 Peripheral vascular disease, unspecified: Secondary | ICD-10-CM | POA: Diagnosis not present

## 2024-09-10 DIAGNOSIS — K5669 Other partial intestinal obstruction: Secondary | ICD-10-CM | POA: Diagnosis not present

## 2024-09-10 DIAGNOSIS — Z825 Family history of asthma and other chronic lower respiratory diseases: Secondary | ICD-10-CM

## 2024-09-10 DIAGNOSIS — I2510 Atherosclerotic heart disease of native coronary artery without angina pectoris: Secondary | ICD-10-CM | POA: Diagnosis present

## 2024-09-10 DIAGNOSIS — R1084 Generalized abdominal pain: Secondary | ICD-10-CM | POA: Insufficient documentation

## 2024-09-10 DIAGNOSIS — I10 Essential (primary) hypertension: Secondary | ICD-10-CM | POA: Diagnosis not present

## 2024-09-10 DIAGNOSIS — Z87891 Personal history of nicotine dependence: Secondary | ICD-10-CM | POA: Diagnosis not present

## 2024-09-10 DIAGNOSIS — K56609 Unspecified intestinal obstruction, unspecified as to partial versus complete obstruction: Secondary | ICD-10-CM | POA: Diagnosis not present

## 2024-09-10 DIAGNOSIS — K567 Ileus, unspecified: Secondary | ICD-10-CM | POA: Diagnosis not present

## 2024-09-10 DIAGNOSIS — Z833 Family history of diabetes mellitus: Secondary | ICD-10-CM

## 2024-09-10 DIAGNOSIS — J449 Chronic obstructive pulmonary disease, unspecified: Secondary | ICD-10-CM | POA: Diagnosis present

## 2024-09-10 DIAGNOSIS — Z66 Do not resuscitate: Secondary | ICD-10-CM | POA: Diagnosis present

## 2024-09-10 DIAGNOSIS — Z6825 Body mass index (BMI) 25.0-25.9, adult: Secondary | ICD-10-CM

## 2024-09-10 DIAGNOSIS — Z7902 Long term (current) use of antithrombotics/antiplatelets: Secondary | ICD-10-CM

## 2024-09-10 DIAGNOSIS — I6529 Occlusion and stenosis of unspecified carotid artery: Secondary | ICD-10-CM | POA: Diagnosis not present

## 2024-09-10 DIAGNOSIS — E43 Unspecified severe protein-calorie malnutrition: Secondary | ICD-10-CM | POA: Diagnosis present

## 2024-09-10 DIAGNOSIS — Z888 Allergy status to other drugs, medicaments and biological substances status: Secondary | ICD-10-CM

## 2024-09-10 DIAGNOSIS — E876 Hypokalemia: Secondary | ICD-10-CM | POA: Diagnosis not present

## 2024-09-10 DIAGNOSIS — I701 Atherosclerosis of renal artery: Secondary | ICD-10-CM | POA: Diagnosis not present

## 2024-09-10 DIAGNOSIS — Z7982 Long term (current) use of aspirin: Secondary | ICD-10-CM

## 2024-09-10 DIAGNOSIS — Z4682 Encounter for fitting and adjustment of non-vascular catheter: Secondary | ICD-10-CM | POA: Diagnosis not present

## 2024-09-10 DIAGNOSIS — D1339 Benign neoplasm of other parts of small intestine: Secondary | ICD-10-CM | POA: Diagnosis not present

## 2024-09-10 DIAGNOSIS — Z8249 Family history of ischemic heart disease and other diseases of the circulatory system: Secondary | ICD-10-CM

## 2024-09-10 DIAGNOSIS — K654 Sclerosing mesenteritis: Secondary | ICD-10-CM | POA: Diagnosis not present

## 2024-09-10 DIAGNOSIS — J9811 Atelectasis: Secondary | ICD-10-CM | POA: Diagnosis not present

## 2024-09-10 DIAGNOSIS — Z90710 Acquired absence of both cervix and uterus: Secondary | ICD-10-CM

## 2024-09-10 DIAGNOSIS — D518 Other vitamin B12 deficiency anemias: Secondary | ICD-10-CM | POA: Diagnosis not present

## 2024-09-10 DIAGNOSIS — Z79899 Other long term (current) drug therapy: Secondary | ICD-10-CM

## 2024-09-10 DIAGNOSIS — E538 Deficiency of other specified B group vitamins: Secondary | ICD-10-CM | POA: Diagnosis present

## 2024-09-10 DIAGNOSIS — Z885 Allergy status to narcotic agent status: Secondary | ICD-10-CM

## 2024-09-10 DIAGNOSIS — K565 Intestinal adhesions [bands], unspecified as to partial versus complete obstruction: Principal | ICD-10-CM | POA: Diagnosis present

## 2024-09-10 DIAGNOSIS — K668 Other specified disorders of peritoneum: Secondary | ICD-10-CM | POA: Diagnosis not present

## 2024-09-10 DIAGNOSIS — I70203 Unspecified atherosclerosis of native arteries of extremities, bilateral legs: Secondary | ICD-10-CM | POA: Diagnosis not present

## 2024-09-10 DIAGNOSIS — D649 Anemia, unspecified: Secondary | ICD-10-CM | POA: Diagnosis present

## 2024-09-10 DIAGNOSIS — R109 Unspecified abdominal pain: Secondary | ICD-10-CM | POA: Diagnosis not present

## 2024-09-10 DIAGNOSIS — D72829 Elevated white blood cell count, unspecified: Secondary | ICD-10-CM | POA: Diagnosis present

## 2024-09-10 LAB — CBC WITH DIFFERENTIAL/PLATELET
Abs Immature Granulocytes: 0.04 K/uL (ref 0.00–0.07)
Basophils Absolute: 0 K/uL (ref 0.0–0.1)
Basophils Relative: 0 %
Eosinophils Absolute: 0.1 K/uL (ref 0.0–0.5)
Eosinophils Relative: 1 %
HCT: 41 % (ref 36.0–46.0)
Hemoglobin: 13.5 g/dL (ref 12.0–15.0)
Immature Granulocytes: 0 %
Lymphocytes Relative: 10 %
Lymphs Abs: 1.3 K/uL (ref 0.7–4.0)
MCH: 29.5 pg (ref 26.0–34.0)
MCHC: 32.9 g/dL (ref 30.0–36.0)
MCV: 89.7 fL (ref 80.0–100.0)
Monocytes Absolute: 1.1 K/uL — ABNORMAL HIGH (ref 0.1–1.0)
Monocytes Relative: 9 %
Neutro Abs: 9.8 K/uL — ABNORMAL HIGH (ref 1.7–7.7)
Neutrophils Relative %: 80 %
Platelets: UNDETERMINED K/uL (ref 150–400)
RBC: 4.57 MIL/uL (ref 3.87–5.11)
RDW: 12.3 % (ref 11.5–15.5)
WBC: 12.3 K/uL — ABNORMAL HIGH (ref 4.0–10.5)
nRBC: 0 % (ref 0.0–0.2)

## 2024-09-10 LAB — COMPREHENSIVE METABOLIC PANEL WITH GFR
ALT: 12 U/L (ref 0–44)
AST: 18 U/L (ref 15–41)
Albumin: 3.8 g/dL (ref 3.5–5.0)
Alkaline Phosphatase: 58 U/L (ref 38–126)
Anion gap: 18 — ABNORMAL HIGH (ref 5–15)
BUN: 19 mg/dL (ref 8–23)
CO2: 22 mmol/L (ref 22–32)
Calcium: 9.2 mg/dL (ref 8.9–10.3)
Chloride: 95 mmol/L — ABNORMAL LOW (ref 98–111)
Creatinine, Ser: 0.94 mg/dL (ref 0.44–1.00)
GFR, Estimated: >60 mL/min (ref >60)
Glucose, Bld: 106 mg/dL — ABNORMAL HIGH (ref 70–99)
Potassium: 3.2 mmol/L — ABNORMAL LOW (ref 3.5–5.1)
Sodium: 135 mmol/L (ref 135–145)
Total Bilirubin: 1.4 mg/dL — ABNORMAL HIGH (ref 0.0–1.2)
Total Protein: 7.2 g/dL (ref 6.5–8.1)

## 2024-09-10 LAB — URINALYSIS, ROUTINE W REFLEX MICROSCOPIC
Bilirubin Urine: NEGATIVE
Glucose, UA: NEGATIVE mg/dL
Ketones, ur: 20 mg/dL — AB
Leukocytes,Ua: NEGATIVE
Nitrite: NEGATIVE
Protein, ur: >=300 mg/dL — AB
Specific Gravity, Urine: >1.046 — ABNORMAL HIGH (ref 1.005–1.030)
pH: 5 (ref 5.0–8.0)

## 2024-09-10 LAB — LIPASE, BLOOD: Lipase: 53 U/L — ABNORMAL HIGH (ref 11–51)

## 2024-09-10 MED ORDER — BISACODYL 5 MG PO TBEC: 5.0000 mg | DELAYED_RELEASE_TABLET | Freq: Every day | ORAL | Status: DC | PRN

## 2024-09-10 MED ORDER — CLOPIDOGREL BISULFATE 75 MG PO TABS
75.0000 mg | ORAL_TABLET | Freq: Every day | ORAL | Status: DC
  Filled 2024-09-10: qty 1

## 2024-09-10 MED ORDER — ROSUVASTATIN CALCIUM 20 MG PO TABS: 40.0000 mg | ORAL_TABLET | Freq: Every day | ORAL | Status: DC

## 2024-09-10 MED ORDER — ONDANSETRON HCL 4 MG PO TABS: 4.0000 mg | ORAL_TABLET | Freq: Four times a day (QID) | ORAL | Status: DC | PRN

## 2024-09-10 MED ORDER — HYDROMORPHONE HCL 1 MG/ML IJ SOLN
0.5000 mg | Freq: Four times a day (QID) | INTRAMUSCULAR | Status: DC | PRN
Start: 2024-09-10 — End: 2024-09-13
  Administered 2024-09-11 – 2024-09-13 (×6): 0.5 mg via INTRAVENOUS
  Filled 2024-09-10: qty 1
  Filled 2024-09-10 (×5): qty 0.5

## 2024-09-10 MED ORDER — ENOXAPARIN SODIUM 40 MG/0.4ML IJ SOSY
40.0000 mg | PREFILLED_SYRINGE | INTRAMUSCULAR | Status: DC
  Administered 2024-09-10 – 2024-09-19 (×10): 40 mg via SUBCUTANEOUS
  Filled 2024-09-10 (×10): qty 0.4

## 2024-09-10 MED ORDER — VITAMIN B-12 1000 MCG PO TABS: 1000.0000 ug | ORAL_TABLET | Freq: Every day | ORAL | Status: DC

## 2024-09-10 MED ORDER — IOHEXOL 350 MG/ML SOLN
100.0000 mL | Freq: Once | INTRAVENOUS | Status: AC | PRN
  Administered 2024-09-10: 100 mL via INTRAVENOUS

## 2024-09-10 MED ORDER — SODIUM CHLORIDE 0.9 % IV BOLUS
500.0000 mL | Freq: Once | INTRAVENOUS | Status: AC
Start: 2024-09-10 — End: 2024-09-10
  Administered 2024-09-10: 500 mL via INTRAVENOUS

## 2024-09-10 MED ORDER — ACETAMINOPHEN 650 MG RE SUPP
650.0000 mg | Freq: Four times a day (QID) | RECTAL | Status: DC | PRN
  Administered 2024-09-11: 650 mg via RECTAL
  Filled 2024-09-10: qty 1

## 2024-09-10 MED ORDER — IPRATROPIUM-ALBUTEROL 0.5-2.5 (3) MG/3ML IN SOLN: 3.0000 mL | Freq: Four times a day (QID) | RESPIRATORY_TRACT | Status: DC | PRN

## 2024-09-10 MED ORDER — POTASSIUM CHLORIDE CRYS ER 20 MEQ PO TBCR: 40.0000 meq | EXTENDED_RELEASE_TABLET | Freq: Once | ORAL | Status: DC

## 2024-09-10 MED ORDER — ONDANSETRON HCL 4 MG/2ML IJ SOLN
4.0000 mg | Freq: Once | INTRAMUSCULAR | Status: AC
  Administered 2024-09-10: 4 mg via INTRAVENOUS
  Filled 2024-09-10: qty 2

## 2024-09-10 MED ORDER — SODIUM CHLORIDE 0.9 % IV SOLN: INTRAVENOUS | Status: AC

## 2024-09-10 MED ORDER — POTASSIUM CHLORIDE 10 MEQ/100ML IV SOLN
10.0000 meq | INTRAVENOUS | Status: AC
  Administered 2024-09-10 – 2024-09-11 (×4): 10 meq via INTRAVENOUS
  Filled 2024-09-10 (×4): qty 100

## 2024-09-10 MED ORDER — MORPHINE SULFATE (PF) 2 MG/ML IV SOLN
4.0000 mg | Freq: Once | INTRAVENOUS | Status: AC
  Administered 2024-09-10: 4 mg via INTRAVENOUS
  Filled 2024-09-10: qty 2

## 2024-09-10 MED ORDER — ACETAMINOPHEN 325 MG PO TABS: 650.0000 mg | ORAL_TABLET | Freq: Four times a day (QID) | ORAL | Status: DC | PRN

## 2024-09-10 MED ORDER — SENNOSIDES-DOCUSATE SODIUM 8.6-50 MG PO TABS
1.0000 | ORAL_TABLET | Freq: Every evening | ORAL | Status: DC | PRN
Start: 2024-09-10 — End: 2024-09-11

## 2024-09-10 MED ORDER — ONDANSETRON HCL 4 MG/2ML IJ SOLN
4.0000 mg | Freq: Four times a day (QID) | INTRAMUSCULAR | Status: DC | PRN
  Administered 2024-09-11: 4 mg via INTRAVENOUS
  Filled 2024-09-10: qty 2

## 2024-09-10 NOTE — ED Provider Triage Note (Signed)
 Emergency Medicine Provider Triage Evaluation Note  Jasmine Acosta , a 73 y.o. female  was evaluated in triage.  Pt complains of generalized abdominal pain for 1 week.  Reports she has not been able to keep anything down for the last 2 days and has not passing gas for the last 2 days. Has mild leukocytosis and mildly tachycardic but no fever.  Had CT scan today that shows small bowel obstruction with transition point.  She tells me she has only had 1 abdominal surgery and it was an abdominal hysterectomy 40 years ago.  Review of Systems  Positive: Nausea vomiting abdominal pain Negative: Fever  Physical Exam  BP 135/75 (BP Location: Right Arm)   Pulse (!) 104   Temp 98 F (36.7 C)   Resp 16   Ht 5' (1.524 m)   Wt 57.2 kg   SpO2 92%   BMI 24.61 kg/m  Gen:   Awake, no distress   Resp:  Normal effort  MSK:   Moves extremities without difficulty  Other:    Medical Decision Making  Medically screening exam initiated at 8:04 PM.  Appropriate orders placed.  Jasmine Acosta was informed that the remainder of the evaluation will be completed by another provider, this initial triage assessment does not replace that evaluation, and the importance of remaining in the ED until their evaluation is complete.     Shermon Warren SAILOR, PA-C 09/10/24 2005

## 2024-09-10 NOTE — ED Provider Notes (Signed)
 [place] [facility] AT [facility] Provider Note   CSN: [id number] Arrival date & time: 09/10/24  1919     Patient presents with: Abdominal Pain   Jasmine Acosta is a 73 y.o. female patient with past medical history of COPD, hypertension, hyperlipidemia, coronary artery occlusion presents to emergency room with complaint of abdominal pain.  Patient tells me that she has had approximately 7 days of generalized abdominal pain that is gradually getting worse.  She had a CT scan today with an outside provider that showed significant small bowel obstruction.  She continues to have 10 out of 10 abdominal pain nausea vomiting.  She has not eaten anything in 2 days.  Reports she has not passed gas in 2 days.  Last bowel movement over a week ago.  No fever. No BT. Hx of abdominal hysterectomy.     Abdominal Pain      Prior to Admission medications   Medication Sig Start Date End Date Taking? Authorizing Provider  amLODipine  (NORVASC ) 5 MG tablet Take 5 mg by mouth daily.  01/24/18   [provider]  aspirin  EC 81 MG tablet Take 1 tablet (81 mg total) by mouth daily. 11/05/18   Marea Selinda RAMAN, MD  clopidogrel  (PLAVIX ) 75 MG tablet Take 1 tablet (75 mg total) by mouth daily with breakfast. 12/07/18   Brown, Fallon E, NP  cyanocobalamin 1000 MCG tablet Take 1 tablet (1,000 mcg total) by mouth daily. 09/06/24 12/05/24  Von Bellis, MD  Iron, Ferrous Sulfate, 325 (65 Fe) MG TABS Take 65 mg/kg of iron by mouth.    [provider]  Multiple Vitamin (MULTIVITAMIN) capsule Take 1 capsule by mouth daily.    [provider]  prochlorperazine (COMPAZINE) 10 MG tablet Take 1 tablet (10 mg total) by mouth 2 (two) times daily as needed for nausea or vomiting. 09/06/24   Yolande Lamar BROCKS, MD  rosuvastatin  (CRESTOR ) 40 MG tablet Take 40 mg by mouth daily.    [provider]  topiramate (TOPAMAX) 50 MG tablet Take 1 tablet by mouth 2 (two) times daily.  02/07/23   [provider]    Allergies: Lisinopril and Codeine    Review of Systems  Gastrointestinal:  Positive for abdominal pain.    Updated Vital Signs BP 135/75 (BP Location: Right Arm)   Pulse (!) 104   Temp 98 F (36.7 C)   Resp 16   Ht 5' (1.524 m)   Wt 57.2 kg   SpO2 92%   BMI 24.61 kg/m   Physical Exam Vitals and nursing note reviewed.  Constitutional:      General: She is not in acute distress.    Appearance: She is ill-appearing. She is not toxic-appearing.  HENT:     Head: Normocephalic and atraumatic.  Eyes:     General: No scleral icterus.    Conjunctiva/sclera: Conjunctivae normal.  Cardiovascular:     Rate and Rhythm: Normal rate and regular rhythm.     Pulses: Normal pulses.     Heart sounds: Normal heart sounds.  Pulmonary:     Effort: Pulmonary effort is normal. No respiratory distress.     Breath sounds: Normal breath sounds.  Abdominal:     General: Abdomen is flat. Bowel sounds are normal. There is no distension.     Palpations: Abdomen is soft. There is no mass.     Tenderness: There is abdominal tenderness.  Skin:    General: Skin is warm and dry.  Findings: No lesion.  Neurological:     General: No focal deficit present.     Mental Status: She is alert and oriented to person, place, and time. Mental status is at baseline.     (all labs ordered are listed, but only abnormal results are displayed) Labs Reviewed  COMPREHENSIVE METABOLIC PANEL WITH GFR - Abnormal; Notable for the following components:      Result Value   Potassium 3.2 (*)    Chloride 95 (*)    Glucose, Bld 106 (*)    Total Bilirubin 1.4 (*)    Anion gap 18 (*)    All other components within normal limits  LIPASE, BLOOD - Abnormal; Notable for the following components:   Lipase 53 (*)    All other components within normal limits  CBC WITH DIFFERENTIAL/PLATELET - Abnormal; Notable for the following components:   WBC 12.3 (*)    Neutro Abs 9.8 (*)     Monocytes Absolute 1.1 (*)    All other components within normal limits  URINALYSIS, ROUTINE W REFLEX MICROSCOPIC - Abnormal; Notable for the following components:   Color, Urine AMBER (*)    APPearance HAZY (*)    Specific Gravity, Urine >1.046 (*)    Hgb urine dipstick MODERATE (*)    Ketones, ur 20 (*)    Protein, ur >=300 (*)    Bacteria, UA RARE (*)    All other components within normal limits  BASIC METABOLIC PANEL WITH GFR  CBC    EKG: None  Radiology: CT Angio Abd/Pel w/ and/or w/o Result Date: 09/10/2024 EXAM: CTA ABDOMEN AND PELVIS WITHOUT AND WITH CONTRAST 09/10/2024 02:07:41 PM TECHNIQUE: CTA images of the abdomen and pelvis without and with intravenous contrast. iohexol  (OMNIPAQUE ) 350 MG/ML injection 100 mL IOHEXOL  350 MG/ML SOLN was administered. Three-dimensional MIP/volume rendered formations were performed. Automated exposure control, iterative reconstruction, and/or weight based adjustment of the mA/kV was utilized to reduce the radiation dose to as low as reasonably achievable. COMPARISON: 09/03/2024 CLINICAL HISTORY: FINDINGS: VASCULATURE: AORTA: Aortic atherosclerosis. No abdominal aortic aneurysm. No dissection. CELIAC TRUNK: No acute finding. No occlusion or significant stenosis. SUPERIOR MESENTERIC ARTERY: No acute finding. No occlusion or significant stenosis. RENAL ARTERIES: Moderate narrowing noted at origin of right renal artery secondary to calcified plaque. The left renal artery demonstrates no acute finding, occlusion, or significant stenosis. ILIAC ARTERIES: Patent stents in both common iliac arteries. Moderate narrowing is seen involving both common femoral artery secondary to calcified plaque. LIVER: The liver is unremarkable. GALLBLADDER AND BILE DUCTS: Gallbladder is unremarkable. No biliary ductal dilatation. SPLEEN: The spleen is unremarkable. PANCREAS: The pancreas is unremarkable. ADRENAL GLANDS: Bilateral adrenal glands demonstrate no acute  abnormality. KIDNEYS, URETERS AND BLADDER: No stones in the kidneys or ureters. No hydronephrosis. No perinephric or periureteral stranding. Urinary bladder is unremarkable. GI AND BOWEL: Severe small bowel dilatation is noted with transitioning zone seen in the pelvis, seen on image 68, series 14. This is concerning for possible adhesion. No colonic dilatation is noted. Stomach and duodenal sweep demonstrate no acute abnormality. No abnormal bowel wall thickening or distension. REPRODUCTIVE: Status post hysterectomy. PERITONEUM AND RETRPERITONEUM: Mild amount of free fluid is noted in the pelvis. No free air. LUNG BASE: No acute abnormality. LYMPH NODES: No lymphadenopathy. BONES AND SOFT TISSUES: No acute abnormality of the bones. No acute soft tissue abnormality. IMPRESSION: 1. Severe small bowel obstruction with a transition zone in the pelvis, concerning for adhesion. 2. Moderate atherosclerotic narrowing at the origin  of the right renal artery. 3. Moderate atherosclerotic narrowing involving both common femoral arteries. 4. Patent stents in both common iliac arteries. Electronically signed by: Lynwood Seip MD 09/10/2024 02:42 PM EST RP Workstation: [id number]     Procedures   Medications Ordered in the ED  sodium chloride  0.9 % bolus 500 mL (has no administration in time range)  acetaminophen  (TYLENOL ) tablet 650 mg (has no administration in time range)    Or  acetaminophen  (TYLENOL ) suppository 650 mg (has no administration in time range)  senna-docusate (Senokot-S) tablet 1 tablet (has no administration in time range)  bisacodyl (DULCOLAX) EC tablet 5 mg (has no administration in time range)  ondansetron  (ZOFRAN ) tablet 4 mg (has no administration in time range)    Or  ondansetron  (ZOFRAN ) injection 4 mg (has no administration in time range)  enoxaparin (LOVENOX) injection 40 mg (has no administration in time range)  potassium chloride  SA (KLOR-CON  M) CR tablet 40 mEq (has no administration  in time range)  morphine  (PF) 2 MG/ML injection 4 mg (4 mg Intravenous Given 09/10/24 2004)  ondansetron  (ZOFRAN ) injection 4 mg (4 mg Intravenous Given 09/10/24 2002)    Clinical Course as of 09/10/24 2137  Tue Sep 10, 2024  2048 Dr Ellouise gen surg, will review, admit to hospital team.  [JB]  2136 Ketones, ur(!): 20 [JB]  2137 WBC(!): 12.3 [JB]  2137 Anion gap(!): 18 [JB]    Clinical Course User Index [JB] Lataisha Colan, Warren SAILOR, PA-C                                 Medical Decision Making Amount and/or Complexity of Data Reviewed Labs: ordered.  Risk Prescription drug management. Decision regarding hospitalization.   This patient presents to the ED for concern of abdominal pain, this involves an extensive number of treatment options, and is a complaint that carries with it a high risk of complications and morbidity.  The differential diagnosis includes cholecystitis, AAA, appendicitis, renal stone, UTI   Co morbidities that complicate the patient evaluation  COPD, hypertension, hyperlipidemia, coronary artery occlusion   Additional history obtained:  Additional history obtained from reviewed patient's recent imaging from earlier today which shows that she has patent stents in her iliac arteries but does show severe small bowel obstruction with transition point concerning for adhesions. Patient had somewhat recent admission 09/03/2024 in which she had colonoscopy and endoscopy.   Lab Tests:  I personally interpreted labs.  The pertinent results include:   CBC shows leukocytosis of 12.3, potassium is 3.2, elevated anion gap, UA positive for ketones.   Imaging Studies ordered:  Reviewed old imaging.   Cardiac Monitoring: / EKG:  The patient was maintained on a cardiac monitor.  I personally viewed and interpreted the cardiac monitored which showed an underlying rhythm of: normal sinus   Consultations Obtained:  I requested consultation with the General Surgery Dr  Luberta,  and discussed lab and imaging findings as well as pertinent plan - they recommend: admission to hospital team, will come see patient.    Problem List / ED Course / Critical interventions / Medication management  Patient presents emergency room with 1 week of generalized abdominal pain.  On arrival she is slightly tachycardic but hemodynamically stable.  She has reproducible tenderness on abdominal exam, no rebound tenderness.  She did have CT scan earlier today which shows small bowel obstruction.  Her symptoms are concerning for such today  as she has not been tolerating oral intake for the last 2 days and not passing gas.  I did reach out to general surgery.  Given analgesics for pain control and repeated lab work. I ordered medication including morphine , Zofran  Reevaluation of the patient after these medicines showed that the patient improved I have reviewed the patients home medicines and have made adjustments as needed Will require admission into the hospital.  I will reach out to hospitalist for admission.      Final diagnoses:  Small bowel obstruction [facility])    ED Discharge Orders     None          Shermon Warren SAILOR, PA-C 09/10/24 2137    Elnor Hila P, DO 09/11/24 0006

## 2024-09-10 NOTE — Progress Notes (Signed)
 History of Present Illness:   Jasmine Acosta is a 73 y.o. female here for   Verbally consented to the use of AI for note-taking.   Chief Complaint  Patient presents with  . hospital follow up      History of Present Illness Jasmine Acosta is a 73 year old female with COPD, hypertension, migraines, peripheral vascular disease, and carotid artery stenosis who presents for hospital follow-up for intractable abdominal pain following which is ongoing  She was admitted to the hospital from November 11th to November 13th for intractable abdominal pain accompanied by nausea. Despite treatment with IV fluids, IV Pepcid , Bentyl, Zofran , and Amorphin, her pain persisted. She was discharged but returned to the emergency room on November 14th due to ongoing nausea and vomiting. She is no longer experiencing nausea and vomiting but continues to have significant abdominal pain, which has been present since last week. The pain is described as constant and severe, affecting her ability to eat and walk upright. She has not had bowel movements since the onset of her symptoms due to her inability to eat.  Her past medical history includes COPD, hypertension, migraines, peripheral vascular disease, and carotid artery stenosis with endarterectomy. She mentions having a stent placed in her abdomen approximately eight years ago and follows up annually with a vascular surgeon.  Her current medications include aspirin , clopidogrel , vitamin B12, iron sulfate, Crestor  40 mg, and topiramate 50 mg. She has a history of marijuana use, with a positive urine drug screen for marijuana during her hospital stay, although she reports not using it frequently. She denies current alcohol use and reports no recent marijuana use.  In the review of symptoms, she denies current nausea, vomiting, or alcohol use, and reports no recent marijuana use. She has not experienced blood in her stools or vomit, except for coffee ground emesis during  her second hospital visit. She reports significant pain in her abdomen and states that she has not had an appetite since last week.   Past Medical History:   Past Medical History:  Diagnosis Date  . History of adenomatous polyp of colon 06/27/2019  . Hypertension   . Internal hemorrhoids 02/02/2015  . Multinodular goiter   . Personal history of arterial venous malformation (AVM) 06/27/2019  . Tubular adenoma of colon 02/02/2015    Past Surgical History:   Past Surgical History:  Procedure Laterality Date  . HYSTERECTOMY  1976   TAH still has ovaries  . COLONOSCOPY  02/02/2015   Tubular adenoma of colon/Repeat 28yrs/MGR  . EGD  07/08/2019   Normal EGD/No Repeat/MUS  . COLONOSCOPY  10/30/2019   Tubular adenoma. 5 year repeat per [facility].  [name] CORONARY ANGIOPLASTY    . FOOT SURGERY      Allergies:   Allergies  Allergen Reactions  . Codeine Nausea  . Lisinopril Angioedema    Of tongue only    Current Medications:   Prior to Admission medications  Medication Sig Taking? Last Dose  amLODIPine  (NORVASC ) 5 MG tablet TAKE 1 TABLET(5 MG) BY MOUTH DAILY    aspirin  81 MG EC tablet Take 1 tablet (81 mg total) by mouth once daily    clopidogreL  (PLAVIX ) 75 mg tablet TAKE 1 TABLET(75 MG) BY MOUTH EVERY DAY    cyanocobalamin (VITAMIN B12) 1000 MCG tablet Take 1,000 mcg by mouth once daily    ferrous sulfate 325 (65 FE) MG tablet Take 325 mg by mouth daily with breakfast    MULTIVITAMIN ORAL Take 1 tablet by  mouth once daily    pantoprazole (PROTONIX) 40 MG DR tablet Take 1 tablet (40 mg total) by mouth once daily    rosuvastatin  (CRESTOR ) 40 MG tablet TAKE 1 TABLET(40 MG) BY MOUTH EVERY DAY    topiramate (TOPAMAX) 50 MG tablet TAKE 1 TABLET(50 MG) BY MOUTH TWICE DAILY    ubrogepant (UBRELVY) 50 mg Tab Take 50 mg by mouth once daily as needed (may repeat in 2 hours)      Family History:   Family History  Problem Relation Name Age of Onset  . Emphysema Father    . Myocardial Infarction  (Heart attack) Brother    . Colon cancer Brother  17  . Diabetes Other         family hx    Social History:   Social History   Socioeconomic History  . Marital status: Single  Tobacco Use  . Smoking status: Former    Current packs/day: 0.00    Types: Cigarettes    Quit date: 06/14/2018    Years since quitting: 6.2  . Smokeless tobacco: Never  Vaping Use  . Vaping status: Never Used  Substance and Sexual Activity  . Alcohol use: No    Alcohol/week: 0.0 standard drinks of alcohol  . Drug use: Defer  . Sexual activity: Not Currently    Birth control/protection: Surgical   Social Drivers of Health   Financial Resource Strain: Low Risk  (02/13/2024)   Overall Financial Resource Strain (CARDIA)   . Difficulty of Paying Living Expenses: Not hard at all  Food Insecurity: Patient Declined (09/04/2024)   Received from [facility]   Hunger Vital Sign   . Within the past 12 months, you worried that your food would run out before you got the money to buy more.: Patient declined   . Within the past 12 months, the food you bought just didn't last and you didn't have money to get more.: Patient declined  Transportation Needs: Patient Declined (09/04/2024)   Received from [facility] - Transportation   . In the past 12 months, has lack of transportation kept you from medical appointments or from getting medications?: Patient declined   . In the past 12 months, has lack of transportation kept you from meetings, work, or from getting things needed for daily living?: Patient declined  Social Connections: Patient Declined (09/04/2024)   Received from [facility]   Social Connection and Isolation Panel   . In a typical week, how many times do you talk on the phone with family, friends, or neighbors?: Patient declined   . How often do you get together with friends or relatives?: Patient declined   . How often do you attend church or religious services?: Patient declined   . Do you  belong to any clubs or organizations such as church groups, unions, fraternal or athletic groups, or school groups?: Patient declined   . How often do you attend meetings of the clubs or organizations you belong to?: Patient declined   . Are you married, widowed, divorced, separated, never married, or living with a partner?: Patient declined  Housing Stability: Unknown (02/13/2024)   Housing Stability Vital Sign   . Unable to Pay for Housing in the Last Year: No   . Homeless in the Last Year: No    Review of Systems:   A 10 point review of systems is negative, except for the pertinent positives and negatives detailed in the HPI.  Vitals:   Vitals:  09/10/24 1127  BP: 136/70  Pulse: 103  SpO2: 96%  Weight: 57.2 kg (126 lb)  Height: 152.4 cm (5')     Body mass index is 24.61 kg/m.  Physical Exam:   Physical Exam Vitals and nursing note reviewed.  Constitutional:      General: She is not in acute distress.    Appearance: Normal appearance. She is not ill-appearing, toxic-appearing or diaphoretic.  HENT:     Head: Normocephalic and atraumatic.     Right Ear: External ear normal.     Left Ear: External ear normal.  Eyes:     Conjunctiva/sclera: Conjunctivae normal.  Cardiovascular:     Rate and Rhythm: Normal rate and regular rhythm.     Pulses: Normal pulses.     Heart sounds: Normal heart sounds.  Pulmonary:     Effort: Pulmonary effort is normal.     Breath sounds: Normal breath sounds.  Abdominal:     Tenderness: There is abdominal tenderness.     Comments: Generalized abdominal pain worse in the epigastric region  Neurological:     Mental Status: She is alert.     Assessment and Plan:  No results found for this visit on 09/10/24.  I reviewed the hospital discharge summary, medication and allergies reconciliation, and any relevant labs/imaging. A phone call was made to the patient within 2 business days of discharge to review their condition, medications, and  follow-up needs. Patient seen in clinic today for post-discharge follow-up as part of transition of care management.  Assessment & Plan Generalized abdominal pain Persistent abdominal pain with nausea and vomiting. Differential includes mesenteric ischemia due to vascular history. Possible acid reflux/gastritis - Ordered cbc, cmp, lipase - Ordered imaging to evaluate for mesenteric ischemia. - Prescribed pantoprazole 40 mg once daily, consider increasing to twice daily if symptoms persist - Advised use of Tylenol  for pain management. - Advised to avoid NSAIDs. - Follow-up with Dr. Lenon in 2 weeks.  Sooner if needed  Hypertension Blood pressure slightly elevated at 161/89. Advised home monitoring and medication adjustment. - Monitor blood pressure at home. - Resume amlodipine  if systolic blood pressure is greater than 130 mmHg. - Continue to monitor - Follow-up with Dr. Lenon in 2 weeks  Peripheral vascular disease and carotid artery stenosis, status post endarterectomy Peripheral vascular disease with carotid artery stenosis. Concern for mesenteric ischemia. Missed vascular follow-up. - Follow up with vascular surgeon. - Ordered stat CT a of the abdomen to evaluate for mesenteric ischemia.  Disposition: Follow-up with Dr. Lenon next week if not improving  There are no Patient Instructions on file for this visit.   This note has been created using automated tools and reviewed for accuracy by provider.  Patient received an After Visit Summary    Attestation Statement:   I personally performed the service, non-incident to. (WP)   MASON MCCLELLAND MINOR, PA

## 2024-09-10 NOTE — H&P (Signed)
 History and Physical  Jasmine Acosta FMW:[id number] DOB: 06/11/1951 DOA: 09/10/2024  PCP: Lenon Layman ORN, MD   Chief Complaint: Abdominal pain, swelling, nausea and vomiting  HPI: Jasmine Acosta is a 73 y.o. female with medical history significant for COPD, HTN, migraine, PVD, carotid artery stenosis s/p endarterectomy, PVD, hypokalemia substance use disorder [facility]) recently admitted for intractable abdominal pain sent by PCP due to small bowel obstruction on outpatient CT. Per grandes, patient was admitted at [facility] from 11/11- 11/13 for abdominal pain and was evaluated with an EGD. After discharge, patient continued to have abdominal pain and poor p.o. intake in the setting of nausea and vomiting. She presented to [facility] 11/14 due to persistent abdominal pain, nausea and vomiting. Patient was given antiemetic and after tolerating p.o. intake, she was discharged from the ED with instructions to follow-up with PCP. She continued to have progression of her abdominal pain and nausea over the last few days. Yesterday, she started having associated abdominal swelling. She had a follow-up with her PCP today and she was advised to present to the ED due to evidence of SBO on CT scan. Reports last bowel movement was 1 week ago. Her abdominal pain, nausea and vomiting have improved after receiving some IV  morphine  and Zofran .   ED Course: Initial vitals show afebrile, HR 104, BP 125/75, SpO2 92% on room air. Initial labs significant for K+ 3.2, WBC 12.3 otherwise normal renal function and LFTs, UA shows significant hemoglobinuria, proteinuria, mild ketonuria but no signs of infection. EKG shows sinus rhythm with LVH.  CTA abdomen and pelvis from PCP office shows severe small bowel obstruction with transition zone in the pelvis concerning for adhesion. Pt received IV morphine , IV Zofran  and IV NS with 500 cc bolus.  General surgery was consulted for evaluation. TRH was consulted for admission.   Review  of Systems: Please see HPI for pertinent positives and negatives. A complete 10 system review of systems are otherwise negative.  Past Medical History:  Diagnosis Date   Carotid artery occlusion    COPD (chronic obstructive pulmonary disease) (HCC) 07/06/2015   Coronary artery disease    Headache    Hypertension    Hyperthyroidism    ablated with iodine   Peripheral vascular disease    Bilateral Carotid Artery Disease   Personal history of arterial venous malformation (AVM)    Pseudoclaudication    Past Surgical History:  Procedure Laterality Date   ABDOMINAL HYSTERECTOMY     heavy bleeding   CATARACT EXTRACTION W/PHACO Right 10/09/2018   Procedure: CATARACT EXTRACTION PHACO AND INTRAOCULAR LENS PLACEMENT (IOC) RIGHT;  Surgeon: Mittie Gaskin, MD;  Location: [facility];  Service: Ophthalmology;  Laterality: Right;   CATARACT EXTRACTION W/PHACO Left 01/01/2019   Procedure: CATARACT EXTRACTION PHACO AND INTRAOCULAR LENS PLACEMENT (IOC)  LEFT;  Surgeon: Mittie Gaskin, MD;  Location: [facility];  Service: Ophthalmology;  Laterality: Left;   COLONOSCOPY WITH PROPOFOL  N/A 10/30/2019   Procedure: COLONOSCOPY WITH PROPOFOL ;  Surgeon: Toledo, Ladell POUR, MD;  Location: [facility] ENDOSCOPY;  Service: Gastroenterology;  Laterality: N/A;   CORONARY ANGIOPLASTY     ENDARTERECTOMY Right 06/14/2018   Procedure: ENDARTERECTOMY CAROTID;  Surgeon: Marea Selinda RAMAN, MD;  Location: [facility] ORS;  Service: Vascular;  Laterality: Right;   ESOPHAGOGASTRODUODENOSCOPY N/A 07/05/2019   Procedure: ESOPHAGOGASTRODUODENOSCOPY (EGD);  Surgeon: Gaylyn Gladis PENNER, MD;  Location: [facility] ENDOSCOPY;  Service: Endoscopy;  Laterality: N/A;   ESOPHAGOGASTRODUODENOSCOPY N/A 09/04/2024   Procedure: EGD (ESOPHAGOGASTRODUODENOSCOPY);  Surgeon:  Jinny Carmine, MD;  Location: [facility] ENDOSCOPY;  Service: Endoscopy;  Laterality: N/A;   ESOPHAGOGASTRODUODENOSCOPY (EGD) WITH PROPOFOL  N/A 07/08/2019   Procedure:  ESOPHAGOGASTRODUODENOSCOPY (EGD) WITH PROPOFOL ;  Surgeon: Gaylyn Gladis PENNER, MD;  Location: [facility] END[facility]COPY;  Service: Endoscopy;  Laterality: N/A;   EYE SURGERY     hallux vagus correction Right    HALLUX VALGUS AUSTIN Left 07/01/2016   Procedure: HALLUX VALGUS AUSTIN left foot;  Surgeon: Krystal Rosella, DPM;  Location: [facility] ORS;  Service: Podiatry;  Laterality: Left;   LOWER EXTREMITY ANGIOGRAPHY Right 11/05/2018   Procedure: LOWER EXTREMITY ANGIOGRAPHY;  Surgeon: Marea Selinda RAMAN, MD;  Location: [facility] INVASIVE CV LAB;  Service: Cardiovascular;  Laterality: Right;   Social History:  reports that she quit smoking about 6 years ago. Her smoking use included cigarettes. She started smoking about 56 years ago. She has a 50 pack-year smoking history. She has never used smokeless tobacco. She reports that she does not drink alcohol and does not use drugs.  Allergies  Allergen Reactions   Lisinopril Other (See Comments)    Other reaction(s): Angioedema Of tongue only   Codeine Nausea And Vomiting    Family History  Problem Relation Age of Onset   Diabetes Mother    Emphysema Father    Heart disease Sister    Heart disease Brother    Breast cancer Neg Hx    Ovarian cancer Neg Hx    Colon cancer Neg Hx      Prior to Admission medications   Medication Sig Start Date End Date Taking? Authorizing Provider  amLODipine  (NORVASC ) 5 MG tablet Take 5 mg by mouth daily.  01/24/18   [provider]  aspirin  EC 81 MG tablet Take 1 tablet (81 mg total) by mouth daily. 11/05/18   Marea Selinda RAMAN, MD  clopidogrel  (PLAVIX ) 75 MG tablet Take 1 tablet (75 mg total) by mouth daily with breakfast. 12/07/18   Brown, Fallon E, NP  cyanocobalamin 1000 MCG tablet Take 1 tablet (1,000 mcg total) by mouth daily. 09/06/24 12/05/24  Von Bellis, MD  Iron, Ferrous Sulfate, 325 (65 Fe) MG TABS Take 65 mg/kg of iron by mouth.    [provider]  Multiple Vitamin (MULTIVITAMIN) capsule Take 1 capsule by mouth daily.     [provider]  prochlorperazine (COMPAZINE) 10 MG tablet Take 1 tablet (10 mg total) by mouth 2 (two) times daily as needed for nausea or vomiting. 09/06/24   Yolande Lamar BROCKS, MD  rosuvastatin  (CRESTOR ) 40 MG tablet Take 40 mg by mouth daily.    [provider]  topiramate (TOPAMAX) 50 MG tablet Take 1 tablet by mouth 2 (two) times daily. 02/07/23   [provider]    Physical Exam: BP 135/75 (BP Location: Right Arm)   Pulse (!) 104   Temp 98 F (36.7 C)   Resp 16   Ht 5' (1.524 m)   Wt 57.2 kg   SpO2 92%   BMI 24.61 kg/m  General: Pleasant, well-appearing elderly woman sitting in triage chair.  No acute distress. HEENT: /AT. Anicteric sclera CV: Tachycardia. Regular rhythm. No murmurs, rubs, or gallops. No LE edema Pulmonary: Lungs CTAB. Normal effort. No wheezing or rales. Abdominal: Soft, mild distention. Nontender. Hypoactive bowel sounds.  Extremities: Palpable radial and DP pulses. Normal ROM. Skin: Warm and dry. No obvious rash or lesions. Neuro: A&Ox3. Moves all extremities. Normal sensation to light touch. No focal deficit. Psych: Normal mood and affect  Labs on Admission:  Basic Metabolic Panel: Recent Labs  Lab 09/04/24 0131 09/04/24 0840 09/05/24 0422 09/05/24 2142 09/10/24 2002  NA 140  --  137 137 135  K 3.4*  --  3.3* 3.4* 3.2*  CL 103  --  103 101 95*  CO2 23  --  20* 21* 22  GLUCOSE 113*  --  111* 138* 106*  BUN 10  --  15 26* 19  CREATININE 0.63  --  0.74 0.91 0.94  CALCIUM  9.6  --  9.6 10.1 9.2  MG  --  2.3 2.4  --   --   PHOS  --  2.9 2.8  --   --    Liver Function Tests: Recent Labs  Lab 09/05/24 2142 09/10/24 2002  AST 27 18  ALT 14 12  ALKPHOS 84 58  BILITOT 0.5 1.4*  PROT 8.3* 7.2  ALBUMIN 4.4 3.8   Recent Labs  Lab 09/05/24 2142 09/10/24 2002  LIPASE 42 53*   No results for input(s): AMMONIA in the last 168 hours. CBC: Recent Labs  Lab 09/04/24 0131 09/05/24 0422 09/05/24 2142  09/10/24 2002  WBC 11.6* 13.1* 14.1* 12.3*  NEUTROABS  --   --   --  9.8*  HGB 12.7 13.6 14.3 13.5  HCT 39.8 42.2 43.0 41.0  MCV 91.9 90.4 89.8 89.7  PLT 151 173 212 PLATELET CLUMPS NOTED ON SMEAR, UNABLE TO ESTIMATE   Cardiac Enzymes: No results for input(s): CKTOTAL, CKMB, CKMBINDEX, TROPONINI in the last 168 hours. BNP (last 3 results) No results for input(s): BNP in the last 8760 hours.  ProBNP (last 3 results) No results for input(s): PROBNP in the last 8760 hours.  CBG: No results for input(s): GLUCAP in the last 168 hours.  Radiological Exams on Admission: CT Angio Abd/Pel w/ and/or w/o Result Date: 09/10/2024 EXAM: CTA ABDOMEN AND PELVIS WITHOUT AND WITH CONTRAST 09/10/2024 02:07:41 PM TECHNIQUE: CTA images of the abdomen and pelvis without and with intravenous contrast. iohexol  (OMNIPAQUE ) 350 MG/ML injection 100 mL IOHEXOL  350 MG/ML SOLN was administered. Three-dimensional MIP/volume rendered formations were performed. Automated exposure control, iterative reconstruction, and/or weight based adjustment of the mA/kV was utilized to reduce the radiation dose to as low as reasonably achievable. COMPARISON: 09/03/2024 CLINICAL HISTORY: FINDINGS: VASCULATURE: AORTA: Aortic atherosclerosis. No abdominal aortic aneurysm. No dissection. CELIAC TRUNK: No acute finding. No occlusion or significant stenosis. SUPERIOR MESENTERIC ARTERY: No acute finding. No occlusion or significant stenosis. RENAL ARTERIES: Moderate narrowing noted at origin of right renal artery secondary to calcified plaque. The left renal artery demonstrates no acute finding, occlusion, or significant stenosis. ILIAC ARTERIES: Patent stents in both common iliac arteries. Moderate narrowing is seen involving both common femoral artery secondary to calcified plaque. LIVER: The liver is unremarkable. GALLBLADDER AND BILE DUCTS: Gallbladder is unremarkable. No biliary ductal dilatation. SPLEEN: The spleen is  unremarkable. PANCREAS: The pancreas is unremarkable. ADRENAL GLANDS: Bilateral adrenal glands demonstrate no acute abnormality. KIDNEYS, URETERS AND BLADDER: No stones in the kidneys or ureters. No hydronephrosis. No perinephric or periureteral stranding. Urinary bladder is unremarkable. GI AND BOWEL: Severe small bowel dilatation is noted with transitioning zone seen in the pelvis, seen on image 68, series 14. This is concerning for possible adhesion. No colonic dilatation is noted. Stomach and duodenal sweep demonstrate no acute abnormality. No abnormal bowel wall thickening or distension. REPRODUCTIVE: Status post hysterectomy. PERITONEUM AND RETRPERITONEUM: Mild amount of free fluid is noted in the pelvis. No free air. LUNG BASE:  No acute abnormality. LYMPH NODES: No lymphadenopathy. BONES AND SOFT TISSUES: No acute abnormality of the bones. No acute soft tissue abnormality. IMPRESSION: 1. Severe small bowel obstruction with a transition zone in the pelvis, concerning for adhesion. 2. Moderate atherosclerotic narrowing at the origin of the right renal artery. 3. Moderate atherosclerotic narrowing involving both common femoral arteries. 4. Patent stents in both common iliac arteries. Electronically signed by: Lynwood Seip MD 09/10/2024 02:42 PM EST RP Workstation: [id number]   Assessment/Plan Jasmine Acosta is a 73 y.o. female with medical history significant for COPD, HTN, migraine, PVD, carotid artery stenosis s/p endarterectomy, PVD, hypokalemia substance use disorder ([facility]C) recently admitted for intractable abdominal pain sent by PCP due to small bowel obstruction on outpatient CT.   # Small bowel obstruction - Patient with history of abdominal hysterectomy presenting with progressive abdominal pain, nausea and vomiting over the last week - CTA abdomen pelvis by PCP today shows severe small bowel obstruction with a transition zone in the pelvis, concerning for adhesion - Patient nontoxic-appearing,  abdomen minimally distended, nontender (received IV morphine  prior to exam) - General Surgery consulted, appreciate recs - Start IV NS at 100 cc/h - Pain control with as needed IV Dilaudid  - IV Zofran  as needed for nausea and vomiting - Keep n.p.o. except with sips with meds  # Hypokalemia - K+ down to 3.2, repleted with oral KCl 40 mill equivalents x 1 - Follow-up morning K+ and mag  # HTN - BP stable with SBP in the 130s - Currently normotensive, continue to hold amlodipine   # COPD - Chronic and stable - As needed DuoNebs  # Peripheral vascular disease with intermittent claudication # Carotid artery stenosis s/p endarterectomy - Status post stenting with CT showing patent stents in both common iliac arteries - Continue Plavix  and rosuvastatin   # Vitamin B12 deficiency - Continue vitamin B12 supplements   DVT prophylaxis: Lovenox     Code Status: Limited: Do not attempt resuscitation (DNR) -DNR-LIMITED -Do Not Intubate/DNI   Consults called: General Surgery  Family Communication: Discussed admission with grand niece at bedside  Severity of Illness: The appropriate patient status for this patient is INPATIENT. Inpatient status is judged to be reasonable and necessary in order to provide the required intensity of service to ensure the patient's safety. The patient's presenting symptoms, physical exam findings, and initial radiographic and laboratory data in the context of their chronic comorbidities is felt to place them at high risk for further clinical deterioration. Furthermore, it is not anticipated that the patient will be medically stable for discharge from the hospital within 2 midnights of admission.   * I certify that at the point of admission it is my clinical judgment that the patient will require inpatient hospital care spanning beyond 2 midnights from the point of admission due to high intensity of service, high risk for further deterioration and high frequency of  surveillance required.*  Level of care: Med-Surg    Lou Claretta HERO, MD 09/10/2024, 10:09 PM [facility] Pager: [phone] Isaiah 41:10   If 7PM-7AM, please contact night-coverage [email or web address] Password [email or web address]

## 2024-09-10 NOTE — ED Triage Notes (Addendum)
 Patient sent after CT scan showed severe small bowel obstruction. CT scan done today for abdominal pain x1 week. Initially having nausea and vomiting but stopped 2 days ago.

## 2024-09-10 NOTE — ED Triage Notes (Signed)
 Pt coming in from home with abdominal pain. Pt reports that they had a ct and it showed a bowel obstruction. Pt reports having nausea and vomiting until today when that stopped. Pt is currently reporting 10/10 abdominal pain.

## 2024-09-10 NOTE — Consult Note (Signed)
 Reason for Consult:SBO Referring Provider: Warren Shad  Jasmine Acosta is an 73 y.o. female.  HPI: 73 yo female with 1 week of abdominal pain, nausea and vomiting. She was admitted at [facility] 11/11-11/13 and discharged but she has not improved. During her hospitalization she had bowel rest and fluids but no NG tube. Her nausea is better but she still has bloating and abdominal pain and has not had a bowel movement in 2 days.  Past Medical History:  Diagnosis Date   Carotid artery occlusion    COPD (chronic obstructive pulmonary disease) (HCC) 07/06/2015   Coronary artery disease    Headache    Hypertension    Hyperthyroidism    ablated with iodine   Peripheral vascular disease    Bilateral Carotid Artery Disease   Personal history of arterial venous malformation (AVM)    Pseudoclaudication     Past Surgical History:  Procedure Laterality Date   ABDOMINAL HYSTERECTOMY     heavy bleeding   CATARACT EXTRACTION W/PHACO Right 10/09/2018   Procedure: CATARACT EXTRACTION PHACO AND INTRAOCULAR LENS PLACEMENT (IOC) RIGHT;  Surgeon: Mittie Gaskin, MD;  Location: [facility];  Service: Ophthalmology;  Laterality: Right;   CATARACT EXTRACTION W/PHACO Left 01/01/2019   Procedure: CATARACT EXTRACTION PHACO AND INTRAOCULAR LENS PLACEMENT (IOC)  LEFT;  Surgeon: Mittie Gaskin, MD;  Location: [facility];  Service: Ophthalmology;  Laterality: Left;   COLONOSCOPY WITH PROPOFOL  N/A 10/30/2019   Procedure: COLONOSCOPY WITH PROPOFOL ;  Surgeon: Toledo, Ladell POUR, MD;  Location: [facility] ENDOSCOPY;  Service: Gastroenterology;  Laterality: N/A;   CORONARY ANGIOPLASTY     ENDARTERECTOMY Right 06/14/2018   Procedure: ENDARTERECTOMY CAROTID;  Surgeon: Marea Selinda RAMAN, MD;  Location: [facility] ORS;  Service: Vascular;  Laterality: Right;   ESOPHAGOGASTRODUODENOSCOPY N/A 07/05/2019   Procedure: ESOPHAGOGASTRODUODENOSCOPY (EGD);  Surgeon: Gaylyn Gladis PENNER, MD;  Location: [facility] ENDOSCOPY;   Service: Endoscopy;  Laterality: N/A;   ESOPHAGOGASTRODUODENOSCOPY N/A 09/04/2024   Procedure: EGD (ESOPHAGOGASTRODUODENOSCOPY);  Surgeon: Jinny Carmine, MD;  Location: [facility] ENDOSCOPY;  Service: Endoscopy;  Laterality: N/A;   ESOPHAGOGASTRODUODENOSCOPY (EGD) WITH PROPOFOL  N/A 07/08/2019   Procedure: ESOPHAGOGASTRODUODENOSCOPY (EGD) WITH PROPOFOL ;  Surgeon: Gaylyn Gladis PENNER, MD;  Location: [facility] ENDOSCOPY;  Service: Endoscopy;  Laterality: N/A;   EYE SURGERY     hallux vagus correction Right    HALLUX VALGUS AUSTIN Left 07/01/2016   Procedure: HALLUX VALGUS AUSTIN left foot;  Surgeon: Krystal Rosella, DPM;  Location: [facility] ORS;  Service: Podiatry;  Laterality: Left;   LOWER EXTREMITY ANGIOGRAPHY Right 11/05/2018   Procedure: LOWER EXTREMITY ANGIOGRAPHY;  Surgeon: Marea Selinda RAMAN, MD;  Location: [facility] INVASIVE CV LAB;  Service: Cardiovascular;  Laterality: Right;    Family History  Problem Relation Age of Onset   Diabetes Mother    Emphysema Father    Heart disease Sister    Heart disease Brother    Breast cancer Neg Hx    Ovarian cancer Neg Hx    Colon cancer Neg Hx     Social History:  reports that she quit smoking about 6 years ago. Her smoking use included cigarettes. She started smoking about 56 years ago. She has a 50 pack-year smoking history. She has never used smokeless tobacco. She reports that she does not drink alcohol and does not use drugs.  Allergies:  Allergies  Allergen Reactions   Lisinopril Other (See Comments)    Other reaction(s): Angioedema Of tongue only   Codeine Nausea And Vomiting    Medications: I have  reviewed the patient's current medications.  Results for orders placed or performed during the hospital encounter of 09/10/24 (from the past 48 hours)  Comprehensive metabolic panel     Status: Abnormal   Collection Time: 09/10/24  8:02 PM  Result Value Ref Range   Sodium 135 135 - 145 mmol/L   Potassium 3.2 (L) 3.5 - 5.1 mmol/L   Chloride 95 (L) 98 - 111 mmol/L    CO2 22 22 - 32 mmol/L   Glucose, Bld 106 (H) 70 - 99 mg/dL    Comment: Glucose reference range applies only to samples taken after fasting for at least 8 hours.   BUN 19 8 - 23 mg/dL   Creatinine, Ser 9.05 0.44 - 1.00 mg/dL   Calcium  9.2 8.9 - 10.3 mg/dL   Total Protein 7.2 6.5 - 8.1 g/dL   Albumin 3.8 3.5 - 5.0 g/dL   AST 18 15 - 41 U/L   ALT 12 0 - 44 U/L   Alkaline Phosphatase 58 38 - 126 U/L   Total Bilirubin 1.4 (H) 0.0 - 1.2 mg/dL   GFR, Estimated >39 >39 mL/min    Comment: (NOTE) Calculated using the CKD-EPI Creatinine Equation (2021)    Anion gap 18 (H) 5 - 15    Comment: Performed at [facility], [address], [place], [place] [address]  Lipase, blood     Status: Abnormal   Collection Time: 09/10/24  8:02 PM  Result Value Ref Range   Lipase 53 (H) 11 - 51 U/L    Comment: Performed at [facility], [address] [place], [place] [address]  CBC with Diff     Status: Abnormal   Collection Time: 09/10/24  8:02 PM  Result Value Ref Range   WBC 12.3 (H) 4.0 - 10.5 K/uL   RBC 4.57 3.87 - 5.11 MIL/uL   Hemoglobin 13.5 12.0 - 15.0 g/dL   HCT 58.9 63.9 - 53.9 %   MCV 89.7 80.0 - 100.0 fL   MCH 29.5 26.0 - 34.0 pg   MCHC 32.9 30.0 - 36.0 g/dL   RDW 87.6 88.4 - 84.4 %   Platelets PLATELET CLUMPS NOTED ON SMEAR, UNABLE TO ESTIMATE 150 - 400 K/uL   nRBC 0.0 0.0 - 0.2 %   Neutrophils Relative % 80 %   Neutro Abs 9.8 (H) 1.7 - 7.7 K/uL   Lymphocytes Relative 10 %   Lymphs Abs 1.3 0.7 - 4.0 K/uL   Monocytes Relative 9 %   Monocytes Absolute 1.1 (H) 0.1 - 1.0 K/uL   Eosinophils Relative 1 %   Eosinophils Absolute 0.1 0.0 - 0.5 K/uL   Basophils Relative 0 %   Basophils Absolute 0.0 0.0 - 0.1 K/uL   WBC Morphology MORPHOLOGY UNREMARKABLE    RBC Morphology MORPHOLOGY UNREMARKABLE    Smear Review See Note     Comment: PLATELET CLUMPS NOTED ON SMEAR, UNABLE TO ESTIMATE   Immature Granulocytes 0 %   Abs Immature Granulocytes 0.04 0.00 - 0.07 K/uL    Comment:  Performed at [facility], [address] [place], [place] [address]  Urinalysis, Routine w reflex microscopic -Urine, Clean Catch     Status: Abnormal   Collection Time: 09/10/24  8:02 PM  Result Value Ref Range   Color, Urine AMBER (A) YELLOW    Comment: BIOCHEMICALS MAY BE AFFECTED BY COLOR   APPearance HAZY (A) CLEAR   Specific Gravity, Urine >1.046 (H) 1.005 - 1.030  pH 5.0 5.0 - 8.0   Glucose, UA NEGATIVE NEGATIVE mg/dL   Hgb urine dipstick MODERATE (A) NEGATIVE   Bilirubin Urine NEGATIVE NEGATIVE   Ketones, ur 20 (A) NEGATIVE mg/dL   Protein, ur >=699 (A) NEGATIVE mg/dL   Nitrite NEGATIVE NEGATIVE   Leukocytes,Ua NEGATIVE NEGATIVE   RBC / HPF 6-10 0 - 5 RBC/hpf   WBC, UA 0-5 0 - 5 WBC/hpf   Bacteria, UA RARE (A) NONE SEEN   Squamous Epithelial / HPF 0-5 0 - 5 /HPF   Mucus PRESENT     Comment: Performed at [facility], [address] [place], [place] [address]    PE Blood pressure 135/75, pulse (!) 104, temperature 98 F (36.7 C), resp. rate 16, height 5' (1.524 m), weight 57.2 kg, SpO2 92%. Constitutional: NAD; conversant; no deformities Eyes: Moist conjunctiva; no lid lag; anicteric; PERRL Neck: Trachea midline; no thyromegaly Lungs: Normal respiratory effort; no tactile fremitus CV: RRR; no palpable thrills; no pitting edema GI: Abd soft, distended, no guarding; no palpable hepatosplenomegaly MSK: Normal gait; no clubbing/cyanosis Psychiatric: Appropriate affect; alert and oriented x3 Lymphatic: No palpable cervical or axillary lymphadenopathy Skin: No major subcutaneous nodules. Warm and dry   Assessment/Plan: 73 yo female with 1 week abdominal pain, abdominal distension, no bowel movement in 2 days consistent with bowel obstruction. I reviewed CT scan images showing multiple dilated loops of intestine with transition in the pelvis. Her stomach is not severely dilated. -given she failed previous hospitalization, I think she should have NG tube  insertion for decompression -bowel rest -IV fluid resuscitation -once NG placed, would undergo SBO protocol  I reviewed last 24 h vitals and pain scores, last 48 h intake and output, last 24 h labs and trends, and last 24 h imaging results.  This care required high  level of medical decision making.   Jasmine Acosta 09/10/2024, 10:33 PM

## 2024-09-11 ENCOUNTER — Inpatient Hospital Stay (HOSPITAL_COMMUNITY)

## 2024-09-11 DIAGNOSIS — Z4682 Encounter for fitting and adjustment of non-vascular catheter: Secondary | ICD-10-CM | POA: Diagnosis not present

## 2024-09-11 DIAGNOSIS — K56609 Unspecified intestinal obstruction, unspecified as to partial versus complete obstruction: Secondary | ICD-10-CM | POA: Diagnosis not present

## 2024-09-11 LAB — COMPREHENSIVE METABOLIC PANEL WITH GFR
ALT: 11 U/L (ref 0–44)
AST: 16 U/L (ref 15–41)
Albumin: 3.1 g/dL — ABNORMAL LOW (ref 3.5–5.0)
Alkaline Phosphatase: 48 U/L (ref 38–126)
Anion gap: 12 (ref 5–15)
BUN: 17 mg/dL (ref 8–23)
CO2: 25 mmol/L (ref 22–32)
Calcium: 8.2 mg/dL — ABNORMAL LOW (ref 8.9–10.3)
Chloride: 98 mmol/L (ref 98–111)
Creatinine, Ser: 0.77 mg/dL (ref 0.44–1.00)
GFR, Estimated: >60 mL/min (ref >60)
Glucose, Bld: 99 mg/dL (ref 70–99)
Potassium: 3.8 mmol/L (ref 3.5–5.1)
Sodium: 135 mmol/L (ref 135–145)
Total Bilirubin: 1.1 mg/dL (ref 0.0–1.2)
Total Protein: 6 g/dL — ABNORMAL LOW (ref 6.5–8.1)

## 2024-09-11 LAB — CBC
HCT: 35.5 % — ABNORMAL LOW (ref 36.0–46.0)
Hemoglobin: 11.7 g/dL — ABNORMAL LOW (ref 12.0–15.0)
MCH: 30 pg (ref 26.0–34.0)
MCHC: 33 g/dL (ref 30.0–36.0)
MCV: 91 fL (ref 80.0–100.0)
Platelets: 159 K/uL (ref 150–400)
RBC: 3.9 MIL/uL (ref 3.87–5.11)
RDW: 12.3 % (ref 11.5–15.5)
WBC: 10 K/uL (ref 4.0–10.5)
nRBC: 0 % (ref 0.0–0.2)

## 2024-09-11 LAB — MAGNESIUM: Magnesium: 2.1 mg/dL (ref 1.7–2.4)

## 2024-09-11 LAB — PHOSPHORUS: Phosphorus: 3.2 mg/dL (ref 2.5–4.6)

## 2024-09-11 MED ORDER — HYDROMORPHONE HCL 1 MG/ML IJ SOLN
0.5000 mg | Freq: Once | INTRAMUSCULAR | Status: AC
  Administered 2024-09-11: 0.5 mg via INTRAVENOUS
  Filled 2024-09-11: qty 0.5

## 2024-09-11 MED ORDER — DIATRIZOATE MEGLUMINE & SODIUM 66-10 % PO SOLN
90.0000 mL | Freq: Once | ORAL | Status: AC
  Administered 2024-09-11: 90 mL via NASOGASTRIC
  Filled 2024-09-11: qty 90

## 2024-09-11 MED ORDER — HYDRALAZINE HCL 20 MG/ML IJ SOLN: 5.0000 mg | Freq: Four times a day (QID) | INTRAMUSCULAR | Status: DC | PRN

## 2024-09-11 MED ORDER — PANTOPRAZOLE SODIUM 40 MG IV SOLR
40.0000 mg | INTRAVENOUS | Status: DC
  Administered 2024-09-11 – 2024-09-20 (×10): 40 mg via INTRAVENOUS
  Filled 2024-09-11 (×10): qty 10

## 2024-09-11 NOTE — Progress Notes (Signed)
 Progress Note     Subjective: Pt reports cramping in left back. Abdominal pain is maybe slightly better since NGT placement. No flatus or BM.   Objective: Vital signs in last 24 hours: Temp:  [98 F (36.7 C)-98.1 F (36.7 C)] 98.1 F (36.7 C) (11/19 0520) Pulse Rate:  [84-104] 97 (11/19 0520) Resp:  [16-18] 16 (11/19 0520) BP: (128-146)/(66-82) 136/67 (11/19 0520) SpO2:  [92 %-100 %] 98 % (11/19 0520) Weight:  [57.2 kg] 57.2 kg (11/18 1946) Last BM Date : 09/03/24  Intake/Output from previous day: No intake/output data recorded. Intake/Output this shift: No intake/output data recorded.  PE: General: pleasant, WD, WN female who is laying in bed in NAD Heart: regular, rate, and rhythm.  Palpable radial pulses bilaterally Lungs: Respiratory effort nonlabored Abd: soft, mild ttp in mid abdomen without peritonitis, ND, NGT with some bloody appearing drainage  Psych: A&Ox3 with an appropriate affect.    Lab Results:  Recent Labs    09/10/24 2002 09/11/24 0302  WBC 12.3* 10.0  HGB 13.5 11.7*  HCT 41.0 35.5*  PLT PLATELET CLUMPS NOTED ON SMEAR, UNABLE TO ESTIMATE 159   BMET Recent Labs    09/10/24 2002 09/11/24 0302  NA 135 135  K 3.2* 3.8  CL 95* 98  CO2 22 25  GLUCOSE 106* 99  BUN 19 17  CREATININE 0.94 0.77  CALCIUM  9.2 8.2*   PT/INR No results for input(s): LABPROT, INR in the last 72 hours. CMP     Component Value Date/Time   NA 135 09/11/2024 0302   K 3.8 09/11/2024 0302   CL 98 09/11/2024 0302   CO2 25 09/11/2024 0302   GLUCOSE 99 09/11/2024 0302   BUN 17 09/11/2024 0302   CREATININE 0.77 09/11/2024 0302   CALCIUM  8.2 (L) 09/11/2024 0302   PROT 6.0 (L) 09/11/2024 0302   ALBUMIN 3.1 (L) 09/11/2024 0302   AST 16 09/11/2024 0302   ALT 11 09/11/2024 0302   ALKPHOS 48 09/11/2024 0302   BILITOT 1.1 09/11/2024 0302   GFRNONAA >60 09/11/2024 0302   GFRAA >60 06/11/2019 1215   Lipase     Component Value Date/Time   LIPASE 53 (H)  09/10/2024 2002       Studies/Results: DG Abd Portable 1 View Result Date: 09/11/2024 EXAM: 1 VIEW XRAY OF THE ABDOMEN 09/11/2024 01:15:02 AM COMPARISON: 09/10/2024 CLINICAL HISTORY: NG placement. FINDINGS: LINES, TUBES AND DEVICES: Enteric tube advanced with tip in the stomach. BOWEL: Dilated small bowel loops in abdomen. SOFT TISSUES: No opaque urinary calculi. BONES: No acute osseous abnormality. IMPRESSION: 1. Dilated small bowel loops, concerning for small bowel obstruction. 2. Enteric tube in good position. Electronically signed by: Morgane Naveau MD 09/11/2024 01:24 AM EST RP Workstation: [id number]   DG Abd Portable 1 View Result Date: 09/10/2024 CLINICAL DATA:  NG tube EXAM: PORTABLE ABDOMEN - 1 VIEW COMPARISON:  Mixed field FINDINGS: nasogastric tube tip is looped on itself with tip projecting cranially. The tube is located in the distal esophagus. There are multiple air-filled dilated small bowel compatible with small-bowel obstruction similar IMPRESSION: Nasogastric tube tip is looped on itself with tip projecting cranially. The tube is located in the distal esophagus. Recommend repositioning. Electronically Signed   By: Greig Pique M.D.   On: 09/10/2024 23:40   CT Angio Abd/Pel w/ and/or w/o Result Date: 09/10/2024 EXAM: CTA ABDOMEN AND PELVIS WITHOUT AND WITH CONTRAST 09/10/2024 02:07:41 PM TECHNIQUE: CTA images of the abdomen and pelvis without and with intravenous contrast.  iohexol  (OMNIPAQUE ) 350 MG/ML injection 100 mL IOHEXOL  350 MG/ML SOLN was administered. Three-dimensional MIP/volume rendered formations were performed. Automated exposure control, iterative reconstruction, and/or weight based adjustment of the mA/kV was utilized to reduce the radiation dose to as low as reasonably achievable. COMPARISON: 09/03/2024 CLINICAL HISTORY: FINDINGS: VASCULATURE: AORTA: Aortic atherosclerosis. No abdominal aortic aneurysm. No dissection. CELIAC TRUNK: No acute finding. No  occlusion or significant stenosis. SUPERIOR MESENTERIC ARTERY: No acute finding. No occlusion or significant stenosis. RENAL ARTERIES: Moderate narrowing noted at origin of right renal artery secondary to calcified plaque. The left renal artery demonstrates no acute finding, occlusion, or significant stenosis. ILIAC ARTERIES: Patent stents in both common iliac arteries. Moderate narrowing is seen involving both common femoral artery secondary to calcified plaque. LIVER: The liver is unremarkable. GALLBLADDER AND BILE DUCTS: Gallbladder is unremarkable. No biliary ductal dilatation. SPLEEN: The spleen is unremarkable. PANCREAS: The pancreas is unremarkable. ADRENAL GLANDS: Bilateral adrenal glands demonstrate no acute abnormality. KIDNEYS, URETERS AND BLADDER: No stones in the kidneys or ureters. No hydronephrosis. No perinephric or periureteral stranding. Urinary bladder is unremarkable. GI AND BOWEL: Severe small bowel dilatation is noted with transitioning zone seen in the pelvis, seen on image 68, series 14. This is concerning for possible adhesion. No colonic dilatation is noted. Stomach and duodenal sweep demonstrate no acute abnormality. No abnormal bowel wall thickening or distension. REPRODUCTIVE: Status post hysterectomy. PERITONEUM AND RETRPERITONEUM: Mild amount of free fluid is noted in the pelvis. No free air. LUNG BASE: No acute abnormality. LYMPH NODES: No lymphadenopathy. BONES AND SOFT TISSUES: No acute abnormality of the bones. No acute soft tissue abnormality. IMPRESSION: 1. Severe small bowel obstruction with a transition zone in the pelvis, concerning for adhesion. 2. Moderate atherosclerotic narrowing at the origin of the right renal artery. 3. Moderate atherosclerotic narrowing involving both common femoral arteries. 4. Patent stents in both common iliac arteries. Electronically signed by: Lynwood Seip MD 09/10/2024 02:42 PM EST RP Workstation: [id number]    Anti-infectives: Anti-infectives  (From admission, onward)    None        Assessment/Plan  SBO - CT yesterday with severe SBO with transition in the pelvis - NGT placed - recommend SBO protocol to start today - I have ordered this - ok to clamp NGT to mobilize  - added PPI for bloody drainage in NGT  - Keep K >4.0, Mg> 2.0 and Phos > 3.0 to optimize bowel function  - no indication for emergent surgical intervention, we will follow   FEN: NPO, IVF per TRH, NGT to LIWS VTE: discontinued plavix , LMWH ID: no current abx  - per TRH -  COPD HTN Migraines PVD Carotid stenosis s/p endarterectomy  THC use    LOS: 1 day   I reviewed hospitalist notes, last 24 h vitals and pain scores, last 48 h intake and output, last 24 h labs and trends, and last 24 h imaging results.  This care required moderate level of medical decision making.    Burnard JONELLE Louder, [facility] 09/11/2024, 9:26 AM Please see Amion for pager number during day hours 7:00am-4:30pm

## 2024-09-11 NOTE — TOC CM/SW Note (Signed)
 Transition of Care [facility]) - Inpatient Brief Assessment   Patient Details  Name: Jasmine Acosta MRN: [id number] Date of Birth: 1951/02/02  Transition of Care [facility]) CM/SW Contact:    Lauraine FORBES Saa, LCSWA Phone Number: 09/11/2024, 4:03 PM   Clinical Narrative:  4:03 PM Per chart review, patient resides at home alone. Patient has a PCP and insurance. Patient does not have SNF/HH/DME history. Patient's preferred pharmacy's are [facility] and [facility] [address]. No TOC needs identified at this time. TOC will continue to follow.  Transition of Care Asessment: Insurance and Status: Insurance coverage has been reviewed Patient has primary care physician: Yes Home environment has been reviewed: Private Residence Prior level of function:: N/A Prior/Current Home Services: No current home services Social Drivers of Health Review: SDOH reviewed no interventions necessary Readmission risk has been reviewed: Yes (Currently Yellow 20%) Transition of care needs: no transition of care needs at this time

## 2024-09-11 NOTE — Hospital Course (Addendum)
 Jasmine Acosta is a 73 y.o. female with past medical history significant for COPD, HTN, migraine, PVD, carotid artery stenosis s/p endarterectomy, PVD, hypokalemia substance use disorder presented hospital with intractable abdominal pain.  Of note patient was recently a presented to [facility] hospital for abdominal pain nausea and vomiting and subsequently to [facility] ED.  He was given antiemetics and had been discharged home but her symptoms continued to progress show was sent by her primary care provider after obtaining CT scan which showed small bowel obstruction.  In the ED, patient was afebrile but tachycardic.  Labs were notable for hypokalemia with potassium of 3.2 and mild leukocytosis at 12.3.  Urinalysis showed proteinuria.  EKG with normal sinus rhythm.  CT scan of abdomen and pelvis showed severe small bowel obstruction with transition zone in the pelvis.  Patient was given symptomatic care, general surgery was consulted and patient was considered for admission to the hospital for further evaluation and treatment.  Subjective: Seen and examined today On the bedside chair.  Reports passing flatus but no BM, no nausea vomiting and pain is controlled Overnight afebrile on room air BP stable.  Labs reviewed stable electrolytes  Assessment and plan:  Small bowel obstruction: Status post ex lap with LOA 11/21, surgery following on full liquid diet plus Ensure and off TPN. Awaiting return of good bowel function and hopefully wean off TPN, ADDH per general surgery. Continue pulmonary support Mucinex  incentive spirometry  Hypokalemia Hypophosphatemia: Improved. Monitor  Hypertension: BP stable continue as needed meds  COPD: Not in exacerbation continue with DuoNebs bronchodilators  History of peripheral vascular disease with intermittent claudication Carotid artery stenosis s/p endarterectomy: S/p stenting with CT showing patent stents in both common iliac arteries. Plavix   resumed on 09/18/2024 per [facility].   Vitamin B12 deficiency On vitamin B12 as outpatient.   Mild anemia: Suspect in the setting of acute illness.  Hemoglobin holding 10 to 11 g.  Stable monitor   Severe malnutrition: RD following on TPN and diet as above augment as tolerated Nutrition Problem: Severe Malnutrition Etiology: acute illness Signs/Symptoms: energy intake < or equal to 50% for > or equal to 5 days, percent weight loss, moderate fat depletion, moderate muscle depletion Percent weight loss: 2.2 % Interventions: TPN, MVI  Mobility: PT Orders: Active PT Follow up Rec: No Pt Follow Up11/26/2025 1054   DVT prophylaxis: enoxaparin  (LOVENOX ) injection 40 mg Start: 09/10/24 2130 Code Status:   Code Status: Limited: Do not attempt resuscitation (DNR) -DNR-LIMITED -Do Not Intubate/DNI  Family Communication: plan of care discussed with patient at bedside. Patient status is: Remains hospitalized because of severity of illness Level of care: Med-Surg   Dispo: The patient is from: HOME            Anticipated disposition: TBD Objective: Vitals last 24 hrs: Vitals:   09/17/24 1559 09/17/24 1941 09/18/24 0300 09/18/24 0733  BP: (!) 132/90 128/66 (!) 140/74 (!) 149/62  Pulse: 92 91 91 91  Resp: 18 16 16 19   Temp: 98.1 F (36.7 C) 98.1 F (36.7 C) 98.6 F (37 C) 98.2 F (36.8 C)  TempSrc: Oral Oral Oral Oral  SpO2: 96% 96% 96% 97%  Weight:      Height:        Physical Examination: General exam: alert awake, oriented, older than stated age HEENT:Oral mucosa moist, Ear/Nose WNL grossly Respiratory system: Bilaterally clear BS,no use of accessory muscle Cardiovascular system: S1 & S2 +, No JVD. Gastrointestinal system: Abdomen soft, surgical site  with honeycomb dressing in place with intact staple underneath  bs sluggish, mildly tender Nervous System: Alert, awake, moving all extremities,and following commands. Extremities: extremities warm, leg edema neg Skin: Warm, no  rashes MSK: Normal muscle bulk,tone, power   Medications reviewed:  Scheduled Meds:  acetaminophen   1,000 mg Oral TID   Chlorhexidine  Gluconate Cloth  6 each Topical Daily   clopidogrel   75 mg Oral Q breakfast   enoxaparin  (LOVENOX ) injection  40 mg Subcutaneous Q24H   feeding supplement  237 mL Oral TID BM   guaiFENesin   600 mg Oral BID   ibuprofen   400 mg Oral TID   insulin  aspart  0-9 Units Subcutaneous Q4H   lidocaine   2 patch Transdermal Daily   pantoprazole  (PROTONIX ) IV  40 mg Intravenous Q24H   senna-docusate  1 tablet Oral BID   Continuous Infusions:  TPN ADULT (ION) 60 mL/hr at 09/18/24 0442   TPN ADULT (ION)     Diet: Diet Order             Diet full liquid Fluid consistency: Thin  Diet effective now

## 2024-09-11 NOTE — Plan of Care (Signed)

## 2024-09-11 NOTE — Progress Notes (Signed)
 PROGRESS NOTE  Jasmine Acosta FMW:[id number] DOB: 05-Jan-1951 DOA: 09/10/2024 PCP: Lenon Layman ORN, MD   LOS: 1 day   Brief narrative:  Jasmine Acosta is a 73 y.o. female with past medical history significant for COPD, HTN, migraine, PVD, carotid artery stenosis s/p endarterectomy, PVD, hypokalemia substance use disorder presented hospital with intractable abdominal pain.  Of note patient was recently a presented to [facility] hospital for abdominal pain nausea and vomiting and subsequently to [facility] ED.  He was given antiemetics and had been discharged home but her symptoms continued to progress show was sent by her primary care provider after obtaining CT scan which showed small bowel obstruction.  In the ED, patient was afebrile but tachycardic.  Labs were notable for hypokalemia with potassium of 3.2 and mild leukocytosis at 12.3.  Urinalysis showed proteinuria.  EKG with normal sinus rhythm.  CT scan of abdomen and pelvis showed severe small bowel obstruction with transition zone in the pelvis.  Patient was given symptomatic care, general surgery was consulted and patient was considered for admission to the hospital for further evaluation and treatment.  Assessment/Plan: Principal Problem:   Small bowel obstruction (HCC) Active Problems:   Hypokalemia   Absolute anemia   Occlusion of carotid artery  Small bowel obstruction History of abdominal hysterectomy presented with abdominal pain nausea and vomiting.  CT abdomen showed severe small bowel obstruction.  General surgery has been consulted.  Continue n.p.o. IV fluids with normal saline at 100 mL/h.  Antiemetics analgesics and supportive care.   Status post NG tube placement. Follow general surgery recommendations.  Hypokalemia  Has been replenished.  Latest potassium 3.8.  Magnesium  2.1.   Essential hypertension. Amlodipine  on hold.  Blood pressure still stable.  Will add as needed hydralazine    COPD. Continue  DuoNebs.  Appears stable.   History of peripheral vascular disease with intermittent claudication  Carotid artery stenosis s/p endarterectomy Status post stenting with CT showing patent stents in both common iliac arteries. Continue Plavix  when p.o. able.   Vitamin B12 deficiency On vitamin B12 as outpatient.   DVT prophylaxis: enoxaparin  (LOVENOX ) injection 40 mg Start: 09/10/24 2130   Disposition: Home likely in 1 to 2 days  Status is: Inpatient Remains inpatient appropriate because: Pending clinical improvement,    Code Status:     Code Status: Limited: Do not attempt resuscitation (DNR) -DNR-LIMITED -Do Not Intubate/DNI   Family Communication: None at present  Consultants: General surgery  Procedures: NG tube placement  Anti-infectives:  None  Anti-infectives (From admission, onward)    None        Subjective: Today, patient was seen and examined at bedside.  She states that she does not have any nausea or vomiting and feels better after NG tube placement.  Has mild abdominal discomfort.  Objective: Vitals:   09/11/24 0400 09/11/24 0520  BP: 137/66 136/67  Pulse: 94 97  Resp: 18 16  Temp:  98.1 F (36.7 C)  SpO2: 92% 98%   No intake or output data in the 24 hours ending 09/11/24 1018 Filed Weights   09/10/24 1946  Weight: 57.2 kg   Body mass index is 24.61 kg/m.   Physical Exam:  GENERAL: Patient is alert awake and oriented. Not in obvious distress.  NG tube in place HENT: No scleral pallor or icterus. Pupils equally reactive to light. Oral mucosa is moist NECK: is supple, no gross swelling noted. CHEST: Clear to auscultation. No crackles or wheezes.   CVS: S1  and S2 heard, no murmur. Regular rate and rhythm.  ABDOMEN: Soft, mild tenderness and distention EXTREMITIES: No edema. CNS: Cranial nerves are intact. No focal motor deficits. SKIN: warm and dry without rashes.  Data Review: I have personally reviewed the following laboratory data  and studies,  CBC: Recent Labs  Lab 09/05/24 0422 09/05/24 2142 09/10/24 2002 09/11/24 0302  WBC 13.1* 14.1* 12.3* 10.0  NEUTROABS  --   --  9.8*  --   HGB 13.6 14.3 13.5 11.7*  HCT 42.2 43.0 41.0 35.5*  MCV 90.4 89.8 89.7 91.0  PLT 173 212 PLATELET CLUMPS NOTED ON SMEAR, UNABLE TO ESTIMATE 159   Basic Metabolic Panel: Recent Labs  Lab 09/05/24 0422 09/05/24 2142 09/10/24 2002 09/11/24 0302  NA 137 137 135 135  K 3.3* 3.4* 3.2* 3.8  CL 103 101 95* 98  CO2 20* 21* 22 25  GLUCOSE 111* 138* 106* 99  BUN 15 26* 19 17  CREATININE 0.74 0.91 0.94 0.77  CALCIUM  9.6 10.1 9.2 8.2*  MG 2.4  --   --  2.1  PHOS 2.8  --   --  3.2   Liver Function Tests: Recent Labs  Lab 09/05/24 2142 09/10/24 2002 09/11/24 0302  AST 27 18 16   ALT 14 12 11   ALKPHOS 84 58 48  BILITOT 0.5 1.4* 1.1  PROT 8.3* 7.2 6.0*  ALBUMIN 4.4 3.8 3.1*   Recent Labs  Lab 09/05/24 2142 09/10/24 2002  LIPASE 42 53*   No results for input(s): AMMONIA in the last 168 hours. Cardiac Enzymes: No results for input(s): CKTOTAL, CKMB, CKMBINDEX, TROPONINI in the last 168 hours. BNP (last 3 results) No results for input(s): BNP in the last 8760 hours.  ProBNP (last 3 results) No results for input(s): PROBNP in the last 8760 hours.  CBG: No results for input(s): GLUCAP in the last 168 hours. Recent Results (from the past 240 hours)  Urine Culture     Status: Abnormal   Collection Time: 09/06/24  3:06 AM   Specimen: Urine, Clean Catch  Result Value Ref Range Status   Specimen Description   Final    URINE, CLEAN CATCH Performed at [facility], [address] [place], [place] [address]    Special Requests   Final    NONE Performed at [facility], [address] [place], [place] [address]    Culture (A)  Final    <10,000 COLONIES/mL INSIGNIFICANT GROWTH Performed at [facility], [address] [place], [place] [address]    Report  Status 09/07/2024 FINAL  Final     Studies: DG Abd Portable 1 View Result Date: 09/11/2024 EXAM: 1 VIEW XRAY OF THE ABDOMEN 09/11/2024 01:15:02 AM COMPARISON: 09/10/2024 CLINICAL HISTORY: NG placement. FINDINGS: LINES, TUBES AND DEVICES: Enteric tube advanced with tip in the stomach. BOWEL: Dilated small bowel loops in abdomen. SOFT TISSUES: No opaque urinary calculi. BONES: No acute osseous abnormality. IMPRESSION: 1. Dilated small bowel loops, concerning for small bowel obstruction. 2. Enteric tube in good position. Electronically signed by: Morgane Naveau MD 09/11/2024 01:24 AM EST RP Workstation: [id number]   DG Abd Portable 1 View Result Date: 09/10/2024 CLINICAL DATA:  NG tube EXAM: PORTABLE ABDOMEN - 1 VIEW COMPARISON:  Mixed field FINDINGS: nasogastric tube tip is looped on itself with tip projecting cranially. The tube is located in the distal esophagus. There are multiple air-filled dilated small bowel compatible with small-bowel obstruction similar IMPRESSION: Nasogastric tube tip is looped on itself  with tip projecting cranially. The tube is located in the distal esophagus. Recommend repositioning. Electronically Signed   By: Greig Pique M.D.   On: 09/10/2024 23:40   CT Angio Abd/Pel w/ and/or w/o Result Date: 09/10/2024 EXAM: CTA ABDOMEN AND PELVIS WITHOUT AND WITH CONTRAST 09/10/2024 02:07:41 PM TECHNIQUE: CTA images of the abdomen and pelvis without and with intravenous contrast. 100mL iohexol  (OMNIPAQUE ) 350 MG/ML injection 100 mL IOHEXOL  350 MG/ML SOLN was administered. Three-dimensional MIP/volume rendered formations were performed. Automated exposure control, iterative reconstruction, and/or weight based adjustment of the mA/kV was utilized to reduce the radiation dose to as low as reasonably achievable. COMPARISON: 09/03/2024 CLINICAL HISTORY: FINDINGS: VASCULATURE: AORTA: Aortic atherosclerosis. No abdominal aortic aneurysm. No dissection. CELIAC TRUNK: No acute finding. No  occlusion or significant stenosis. SUPERIOR MESENTERIC ARTERY: No acute finding. No occlusion or significant stenosis. RENAL ARTERIES: Moderate narrowing noted at origin of right renal artery secondary to calcified plaque. The left renal artery demonstrates no acute finding, occlusion, or significant stenosis. ILIAC ARTERIES: Patent stents in both common iliac arteries. Moderate narrowing is seen involving both common femoral artery secondary to calcified plaque. LIVER: The liver is unremarkable. GALLBLADDER AND BILE DUCTS: Gallbladder is unremarkable. No biliary ductal dilatation. SPLEEN: The spleen is unremarkable. PANCREAS: The pancreas is unremarkable. ADRENAL GLANDS: Bilateral adrenal glands demonstrate no acute abnormality. KIDNEYS, URETERS AND BLADDER: No stones in the kidneys or ureters. No hydronephrosis. No perinephric or periureteral stranding. Urinary bladder is unremarkable. GI AND BOWEL: Severe small bowel dilatation is noted with transitioning zone seen in the pelvis, seen on image 68, series 14. This is concerning for possible adhesion. No colonic dilatation is noted. Stomach and duodenal sweep demonstrate no acute abnormality. No abnormal bowel wall thickening or distension. REPRODUCTIVE: Status post hysterectomy. PERITONEUM AND RETRPERITONEUM: Mild amount of free fluid is noted in the pelvis. No free air. LUNG BASE: No acute abnormality. LYMPH NODES: No lymphadenopathy. BONES AND SOFT TISSUES: No acute abnormality of the bones. No acute soft tissue abnormality. IMPRESSION: 1. Severe small bowel obstruction with a transition zone in the pelvis, concerning for adhesion. 2. Moderate atherosclerotic narrowing at the origin of the right renal artery. 3. Moderate atherosclerotic narrowing involving both common femoral arteries. 4. Patent stents in both common iliac arteries. Electronically signed by: Lynwood Seip MD 09/10/2024 02:42 PM EST RP Workstation: [id number]      Vernal Alstrom, MD  [facility] 09/11/2024  If 7PM-7AM, please contact night-coverage

## 2024-09-12 ENCOUNTER — Inpatient Hospital Stay (HOSPITAL_COMMUNITY)

## 2024-09-12 ENCOUNTER — Other Ambulatory Visit: Payer: Self-pay

## 2024-09-12 DIAGNOSIS — K56609 Unspecified intestinal obstruction, unspecified as to partial versus complete obstruction: Secondary | ICD-10-CM | POA: Diagnosis not present

## 2024-09-12 DIAGNOSIS — Z4682 Encounter for fitting and adjustment of non-vascular catheter: Secondary | ICD-10-CM | POA: Diagnosis not present

## 2024-09-12 LAB — CBC
HCT: 38 % (ref 36.0–46.0)
Hemoglobin: 12 g/dL (ref 12.0–15.0)
MCH: 29.4 pg (ref 26.0–34.0)
MCHC: 31.6 g/dL (ref 30.0–36.0)
MCV: 93.1 fL (ref 80.0–100.0)
Platelets: 160 K/uL (ref 150–400)
RBC: 4.08 MIL/uL (ref 3.87–5.11)
RDW: 12.4 % (ref 11.5–15.5)
WBC: 9 K/uL (ref 4.0–10.5)
nRBC: 0.2 % (ref 0.0–0.2)

## 2024-09-12 LAB — BASIC METABOLIC PANEL WITH GFR
Anion gap: 18 — ABNORMAL HIGH (ref 5–15)
BUN: 15 mg/dL (ref 8–23)
CO2: 21 mmol/L — ABNORMAL LOW (ref 22–32)
Calcium: 8.6 mg/dL — ABNORMAL LOW (ref 8.9–10.3)
Chloride: 102 mmol/L (ref 98–111)
Creatinine, Ser: 0.94 mg/dL (ref 0.44–1.00)
GFR, Estimated: >60 mL/min (ref >60)
Glucose, Bld: 94 mg/dL (ref 70–99)
Potassium: 3.3 mmol/L — ABNORMAL LOW (ref 3.5–5.1)
Sodium: 141 mmol/L (ref 135–145)

## 2024-09-12 LAB — GLUCOSE, CAPILLARY
Glucose-Capillary: 103 mg/dL — ABNORMAL HIGH (ref 70–99)
Glucose-Capillary: 134 mg/dL — ABNORMAL HIGH (ref 70–99)
Glucose-Capillary: 132 mg/dL — ABNORMAL HIGH (ref 70–99)

## 2024-09-12 LAB — MAGNESIUM: Magnesium: 2.2 mg/dL (ref 1.7–2.4)

## 2024-09-12 LAB — PHOSPHORUS: Phosphorus: 4.1 mg/dL (ref 2.5–4.6)

## 2024-09-12 MED ORDER — CHLORHEXIDINE GLUCONATE CLOTH 2 % EX PADS
6.0000 | MEDICATED_PAD | Freq: Every day | CUTANEOUS | Status: DC
  Administered 2024-09-13 – 2024-09-20 (×8): 6 via TOPICAL

## 2024-09-12 MED ORDER — TRAVASOL 10 % IV SOLN
INTRAVENOUS | Status: AC
  Filled 2024-09-12: qty 349.9

## 2024-09-12 MED ORDER — INSULIN ASPART 100 UNIT/ML IJ SOLN
0.0000 [IU] | INTRAMUSCULAR | Status: DC
  Administered 2024-09-12 (×2): 1 [IU] via SUBCUTANEOUS
  Administered 2024-09-13 – 2024-09-14 (×3): 2 [IU] via SUBCUTANEOUS
  Administered 2024-09-14 (×2): 1 [IU] via SUBCUTANEOUS
  Administered 2024-09-14: 2 [IU] via SUBCUTANEOUS
  Administered 2024-09-14 – 2024-09-19 (×20): 1 [IU] via SUBCUTANEOUS
  Filled 2024-09-12 (×14): qty 1
  Filled 2024-09-12 (×2): qty 2
  Filled 2024-09-12: qty 1
  Filled 2024-09-12 (×2): qty 2
  Filled 2024-09-12 (×5): qty 1
  Filled 2024-09-12: qty 2
  Filled 2024-09-12: qty 1

## 2024-09-12 MED ORDER — DIATRIZOATE MEGLUMINE & SODIUM 66-10 % PO SOLN
90.0000 mL | Freq: Once | ORAL | Status: AC
  Administered 2024-09-12: 90 mL via NASOGASTRIC
  Filled 2024-09-12: qty 90

## 2024-09-12 MED ORDER — SODIUM CHLORIDE 0.9 % IV SOLN: INTRAVENOUS | Status: AC

## 2024-09-12 MED ORDER — POTASSIUM CHLORIDE 10 MEQ/100ML IV SOLN
10.0000 meq | INTRAVENOUS | Status: AC
  Administered 2024-09-12 (×4): 10 meq via INTRAVENOUS
  Filled 2024-09-12 (×4): qty 100

## 2024-09-12 MED ORDER — SODIUM CHLORIDE 0.9% FLUSH: 10.0000 mL | INTRAVENOUS | Status: DC | PRN

## 2024-09-12 NOTE — Plan of Care (Signed)
  Problem: Education: Goal: Knowledge of General Education information will improve Description: Including pain rating scale, medication(s)/side effects and non-pharmacologic comfort measures Outcome: Progressing   Problem: Nutrition: Goal: Adequate nutrition will be maintained Outcome: Progressing   Problem: Coping: Goal: Level of anxiety will decrease Outcome: Progressing   Problem: Elimination: Goal: Will not experience complications related to bowel motility Outcome: Progressing   Problem: Pain Managment: Goal: General experience of comfort will improve and/or be controlled Outcome: Progressing

## 2024-09-12 NOTE — Progress Notes (Signed)
 PROGRESS NOTE  Jasmine Acosta FMW:[id number] DOB: 05-24-1951 DOA: 09/10/2024 PCP: Lenon Layman ORN, MD   LOS: 2 days   Brief narrative:  Jasmine Acosta is a 73 y.o. female with past medical history significant for COPD, HTN, migraine, PVD, carotid artery stenosis s/p endarterectomy, PVD, hypokalemia substance use disorder presented hospital with intractable abdominal pain.  Of note patient was recently a presented to [facility] hospital for abdominal pain nausea and vomiting and subsequently to [facility] ED.  He was given antiemetics and had been discharged home but her symptoms continued to progress show was sent by her primary care provider after obtaining CT scan which showed small bowel obstruction.  In the ED, patient was afebrile but tachycardic.  Labs were notable for hypokalemia with potassium of 3.2 and mild leukocytosis at 12.3.  Urinalysis showed proteinuria.  EKG with normal sinus rhythm.  CT scan of abdomen and pelvis showed severe small bowel obstruction with transition zone in the pelvis.  Patient was given symptomatic care, general surgery was consulted and patient was considered for admission to the hospital for further evaluation and treatment.  Assessment/Plan: Principal Problem:   Small bowel obstruction (HCC) Active Problems:   Hypokalemia   Absolute anemia   Occlusion of carotid artery  Small bowel obstruction History of abdominal hysterectomy presented with abdominal pain, nausea and vomiting.  CT abdomen showed severe small bowel obstruction.  General surgery has been consulted.  Continue n.p.o. IV fluids with normal saline at 100 mL/h.  Continue antiemetics, analgesics and supportive care.   Status post NG tube placement. Follow general surgery recommendations.  Still complains of bloating sensation but no nausea or vomiting  Hypokalemia   Latest potassium 3. 3 replenished with IV KCl today.  Check levels in AM.   Essential hypertension. Amlodipine  on  hold.  As needed hydralazine .  Blood pressure stable   COPD. Continue DuoNebs.  Appears stable.   History of peripheral vascular disease with intermittent claudication  Carotid artery stenosis s/p endarterectomy Status post stenting with CT showing patent stents in both common iliac arteries. Continue Plavix  when p.o. able.   Vitamin B12 deficiency On vitamin B12 as outpatient.  Currently on hold.  DVT prophylaxis: enoxaparin (LOVENOX) injection 40 mg Start: 09/10/24 2130   Disposition: Home likely in 1 to 2 days when bowel obstruction resolved  Status is: Inpatient Remains inpatient appropriate because: Pending clinical improvement,    Code Status:     Code Status: Limited: Do not attempt resuscitation (DNR) -DNR-LIMITED -Do Not Intubate/DNI   Family Communication: None at present  Consultants: General surgery  Procedures: NG tube placement  Anti-infectives:  None  Anti-infectives (From admission, onward)    None        Subjective: Today, patient was seen and examined at bedside.  Denies any nausea vomiting but has some gaseous bloating sensation.  Has not passed flatus or had bowel movement.  Feels better with NG tube in place.  Denies any fever shortness of breath dyspnea  Objective: Vitals:   09/12/24 0301 09/12/24 0752  BP: 136/70 (!) 120/59  Pulse: 100 88  Resp: 18 16  Temp: 98.2 F (36.8 C) 98.1 F (36.7 C)  SpO2: 94% 92%    Intake/Output Summary (Last 24 hours) at 09/12/2024 0934 Last data filed at 09/12/2024 0654 Gross per 24 hour  Intake --  Output 550 ml  Net -550 ml   Filed Weights   09/10/24 1946  Weight: 57.2 kg   Body mass index is  24.61 kg/m.   Physical Exam:  GENERAL: Patient is alert awake and oriented. Not in obvious distress.  NG tube in place HENT: No scleral pallor or icterus. Pupils equally reactive to light. Oral mucosa is moist NECK: is supple, no gross swelling noted. CHEST: Clear to auscultation. No crackles or  wheezes.   CVS: S1 and S2 heard, no murmur. Regular rate and rhythm.  ABDOMEN: Soft, mildly distended abdomen, unable to hear bowel sounds. EXTREMITIES: No edema. CNS: Cranial nerves are intact. No focal motor deficits. SKIN: warm and dry without rashes.  Data Review: I have personally reviewed the following laboratory data and studies,  CBC: Recent Labs  Lab 09/05/24 2142 09/10/24 2002 09/11/24 0302 09/12/24 0401  WBC 14.1* 12.3* 10.0 9.0  NEUTROABS  --  9.8*  --   --   HGB 14.3 13.5 11.7* 12.0  HCT 43.0 41.0 35.5* 38.0  MCV 89.8 89.7 91.0 93.1  PLT 212 PLATELET CLUMPS NOTED ON SMEAR, UNABLE TO ESTIMATE 159 160   Basic Metabolic Panel: Recent Labs  Lab 09/05/24 2142 09/10/24 2002 09/11/24 0302 09/12/24 0401  NA 137 135 135 141  K 3.4* 3.2* 3.8 3.3*  CL 101 95* 98 102  CO2 21* 22 25 21*  GLUCOSE 138* 106* 99 94  BUN 26* 19 17 15   CREATININE 0.91 0.94 0.77 0.94  CALCIUM  10.1 9.2 8.2* 8.6*  MG  --   --  2.1 2.2  PHOS  --   --  3.2 4.1   Liver Function Tests: Recent Labs  Lab 09/05/24 2142 09/10/24 2002 09/11/24 0302  AST 27 18 16   ALT 14 12 11   ALKPHOS 84 58 48  BILITOT 0.5 1.4* 1.1  PROT 8.3* 7.2 6.0*  ALBUMIN 4.4 3.8 3.1*   Recent Labs  Lab 09/05/24 2142 09/10/24 2002  LIPASE 42 53*   No results for input(s): AMMONIA in the last 168 hours. Cardiac Enzymes: No results for input(s): CKTOTAL, CKMB, CKMBINDEX, TROPONINI in the last 168 hours. BNP (last 3 results) No results for input(s): BNP in the last 8760 hours.  ProBNP (last 3 results) No results for input(s): PROBNP in the last 8760 hours.  CBG: No results for input(s): GLUCAP in the last 168 hours. Recent Results (from the past 240 hours)  Urine Culture     Status: Abnormal   Collection Time: 09/06/24  3:06 AM   Specimen: Urine, Clean Catch  Result Value Ref Range Status   Specimen Description   Final    URINE, CLEAN CATCH Performed at [facility], [address] [place], [place] [address]    Special Requests   Final    NONE Performed at [facility], [address] [place], [place] [address]    Culture (A)  Final    <10,000 COLONIES/mL INSIGNIFICANT GROWTH Performed at [facility], [address] [place], [place] [address]    Report Status 09/07/2024 FINAL  Final     Studies: DG Abd Portable 1V-Small Bowel Obstruction Protocol-24 hr delay Result Date: 09/12/2024 EXAM: 1 VIEW XRAY OF THE ABDOMEN 09/12/2024 06:30:00 AM COMPARISON: 09/11/2024 CLINICAL HISTORY: SBO (small bowel obstruction) (HCC) FINDINGS: LINES, TUBES AND DEVICES: Enteric tube in place with distal tip and side port terminating within the expected location of the gastric fundus and body. Bilateral common iliac artery stents present. BOWEL: Multiple dilated small bowel loops unchanged. SOFT TISSUES: No opaque urinary calculi. BONES: No acute osseous abnormality. IMPRESSION: 1. Multiple dilated small bowel loops consistent  with small bowel obstruction, unchanged from prior examination. Electronically signed by: Waddell Calk MD 09/12/2024 07:26 AM EST RP Workstation: [id number]   DG Abd Portable 1V-Small Bowel Obstruction Protocol-initial, 8 hr delay Result Date: 09/11/2024 CLINICAL DATA:  Small bowel obstruction. EXAM: PORTABLE ABDOMEN - 1 VIEW COMPARISON:  September 11, 2024 (1:10 a.m.) FINDINGS: There is stable nasogastric tube positioning with its distal tip seen within the body of the stomach. Numerous dilated loops of air-filled small bowel are seen throughout the abdomen. This is unchanged severity when compared to the prior study. No radio-opaque calculi or other significant radiographic abnormality are seen. IMPRESSION: 1. Stable nasogastric tube positioning. 2. Stable small bowel obstruction. Electronically Signed   By: Suzen Dials M.D.   On: 09/11/2024 19:35   DG Abd Portable 1 View Result Date: 09/11/2024 EXAM: 1 VIEW XRAY OF THE  ABDOMEN 09/11/2024 01:15:02 AM COMPARISON: 09/10/2024 CLINICAL HISTORY: NG placement. FINDINGS: LINES, TUBES AND DEVICES: Enteric tube advanced with tip in the stomach. BOWEL: Dilated small bowel loops in abdomen. SOFT TISSUES: No opaque urinary calculi. BONES: No acute osseous abnormality. IMPRESSION: 1. Dilated small bowel loops, concerning for small bowel obstruction. 2. Enteric tube in good position. Electronically signed by: Morgane Naveau MD 09/11/2024 01:24 AM EST RP Workstation: [id number]   DG Abd Portable 1 View Result Date: 09/10/2024 CLINICAL DATA:  NG tube EXAM: PORTABLE ABDOMEN - 1 VIEW COMPARISON:  Mixed field FINDINGS: nasogastric tube tip is looped on itself with tip projecting cranially. The tube is located in the distal esophagus. There are multiple air-filled dilated small bowel compatible with small-bowel obstruction similar IMPRESSION: Nasogastric tube tip is looped on itself with tip projecting cranially. The tube is located in the distal esophagus. Recommend repositioning. Electronically Signed   By: Greig Pique M.D.   On: 09/10/2024 23:40   CT Angio Abd/Pel w/ and/or w/o Result Date: 09/10/2024 EXAM: CTA ABDOMEN AND PELVIS WITHOUT AND WITH CONTRAST 09/10/2024 02:07:41 PM TECHNIQUE: CTA images of the abdomen and pelvis without and with intravenous contrast. iohexol  (OMNIPAQUE ) 350 MG/ML injection 100 mL IOHEXOL  350 MG/ML SOLN was administered. Three-dimensional MIP/volume rendered formations were performed. Automated exposure control, iterative reconstruction, and/or weight based adjustment of the mA/kV was utilized to reduce the radiation dose to as low as reasonably achievable. COMPARISON: 09/03/2024 CLINICAL HISTORY: FINDINGS: VASCULATURE: AORTA: Aortic atherosclerosis. No abdominal aortic aneurysm. No dissection. CELIAC TRUNK: No acute finding. No occlusion or significant stenosis. SUPERIOR MESENTERIC ARTERY: No acute finding. No occlusion or significant stenosis. RENAL  ARTERIES: Moderate narrowing noted at origin of right renal artery secondary to calcified plaque. The left renal artery demonstrates no acute finding, occlusion, or significant stenosis. ILIAC ARTERIES: Patent stents in both common iliac arteries. Moderate narrowing is seen involving both common femoral artery secondary to calcified plaque. LIVER: The liver is unremarkable. GALLBLADDER AND BILE DUCTS: Gallbladder is unremarkable. No biliary ductal dilatation. SPLEEN: The spleen is unremarkable. PANCREAS: The pancreas is unremarkable. ADRENAL GLANDS: Bilateral adrenal glands demonstrate no acute abnormality. KIDNEYS, URETERS AND BLADDER: No stones in the kidneys or ureters. No hydronephrosis. No perinephric or periureteral stranding. Urinary bladder is unremarkable. GI AND BOWEL: Severe small bowel dilatation is noted with transitioning zone seen in the pelvis, seen on image 68, series 14. This is concerning for possible adhesion. No colonic dilatation is noted. Stomach and duodenal sweep demonstrate no acute abnormality. No abnormal bowel wall thickening or distension. REPRODUCTIVE: Status post hysterectomy. PERITONEUM AND RETRPERITONEUM: Mild amount of free fluid is noted in the pelvis.  No free air. LUNG BASE: No acute abnormality. LYMPH NODES: No lymphadenopathy. BONES AND SOFT TISSUES: No acute abnormality of the bones. No acute soft tissue abnormality. IMPRESSION: 1. Severe small bowel obstruction with a transition zone in the pelvis, concerning for adhesion. 2. Moderate atherosclerotic narrowing at the origin of the right renal artery. 3. Moderate atherosclerotic narrowing involving both common femoral arteries. 4. Patent stents in both common iliac arteries. Electronically signed by: Lynwood Seip MD 09/10/2024 02:42 PM EST RP Workstation: [id number]      Vernal Alstrom, MD  [facility] 09/12/2024  If 7PM-7AM, please contact night-coverage

## 2024-09-12 NOTE — Progress Notes (Signed)
 Initial Nutrition Assessment  DOCUMENTATION CODES:   Severe malnutrition in context of acute illness/injury  INTERVENTION:  Start TPN for nutrition support via PICC  Management per pharmacy NPO except for ice chips, advance diet per surgary  High Risk for refeeding. Start IV thiamine 100 mg x 7 days. Monitor K, Phos, and Mg; replete outside TPN bag as needed    NUTRITION DIAGNOSIS:   Severe Malnutrition related to acute illness as evidenced by energy intake < or equal to 50% for > or equal to 5 days, percent weight loss, moderate fat depletion, moderate muscle depletion.   GOAL:   Patient will meet greater than or equal to 90% of their needs   MONITOR:   Diet advancement, I & O's, Weight trends  REASON FOR ASSESSMENT:   Consult New TPN/TNA  ASSESSMENT:   Past medical history significant for COPD, HTN, migraine, PVD, carotid artery stenosis s/p endarterectomy, PVD, hypokalemia substance use disorder [facility]) recently admitted for intractable abdominal pain sent by PCP due to small bowel obstruction on outpatient CT.  11/18 CT: Severe small bowel dilatation is noted with transitioning zone seen in the pelvis  11/20 KUB: Multiple dilated small bowel loops consistent with small bowel obstruction, unchanged from prior examination.  Patient seen in room, NGT in place. Pt reports N/V starting about a week ago, went to [place] and continued to have N/V with abd pain. Reports she has not been able to keep solid food down since symptoms started, has been NPO since admission. The last meal that she ate was a hot dog on Saturday night which she ended up throwing back up a few hours later. Pt reports nausea has improved, no abd pain today just feeling bloated. RD explained to patient that she will get a new IV (PICC) to start TPN and that diet advancement will per surgery pending resolution of SBO.     24 hr output: 550 ml NGT   Admit weight: 57.2 kg - copied forward from previous  encounter  Current weight: 58.9 kg  Pt reports UBW 132 lbs, 2.25 weight loss x 1 week since onset of GI symptoms   Nutritionally Relevant Medications:  SSI 0-9 units q 4 hrs, protonix    Labs Reviewed: K 3.3, Calcium  8.6   NUTRITION - FOCUSED PHYSICAL EXAM:  Flowsheet Row Most Recent Value  Orbital Region Mild depletion  Upper Arm Region Moderate depletion  Thoracic and Lumbar Region Moderate depletion  Buccal Region Mild depletion  Temple Region Moderate depletion  Clavicle Bone Region Moderate depletion  Clavicle and Acromion Bone Region Moderate depletion  Scapular Bone Region Mild depletion  Dorsal Hand Moderate depletion  Patellar Region Mild depletion  Anterior Thigh Region Mild depletion  Posterior Calf Region Moderate depletion  Hair Reviewed  Eyes Reviewed  Mouth Reviewed  Skin Reviewed  Nails Reviewed    Diet Order:   Diet Order             Diet NPO time specified Except for: Ice Chips  Diet effective now                   EDUCATION NEEDS:   Education needs have been addressed  Skin:  Skin Assessment: Reviewed RN Assessment  Last BM:  PTA (11/11)  Height:   Ht Readings from Last 1 Encounters:  09/10/24 5' (1.524 m)    Weight:   Wt Readings from Last 1 Encounters:  09/12/24 58.9 kg     BMI:  Body mass index  is 25.36 kg/m.  Estimated Nutritional Needs:   Kcal:  1530-1760  Protein:  68-88 g  Fluid:  1.5-1.7L/d  Madalyn Potters, MS, RD, LDN Clinical Dietitian  Contact via secure chat. If unavailable, use group chat RD Inpatient.

## 2024-09-12 NOTE — Progress Notes (Signed)
 PHARMACY - TOTAL PARENTERAL NUTRITION CONSULT NOTE   Indication: Small bowel obstruction  Patient Measurements: Height: 5' (152.4 cm) Weight: 58.9 kg (129 lb 13.6 oz) IBW/kg (Calculated) : 45.5 TPN AdjBW (KG): 48.4 Body mass index is 25.36 kg/m. Usual Weight: ~60 kg  Assessment:  73 yo F who presented with abdominal pain, nausea, and vomiting. She was recently admitted 11/11-11/13 w abdominal pain and poor PO intake. She has had continued poor PO intake. She was found to have a small bowel obstruction with transition in the pelvis. Pharmacy was consulted for TPN in setting of prolonged NPO status and small bowel obstruction.   Glucose / Insulin: 01/2024 A1c= 5.0 - CBG<100 currently, 0u SSI  Electrolytes: K 3.3, CO2 21,  CoCa 9.3 - others WNL  Renal: scr 0.94, BUN 15  Hepatic: LFT WNL, Alb 3.1  Intake / Output; MIVF: NGT output , Net - this admit   GI Imaging:  11/20 KUB multiple dilated small bowel loops c/w SBO (unchanged from prior)  GI Surgeries / Procedures: none since TPN start  Central access: PICC 09/12/2024  TPN start date: 09/12/2024  Nutritional Goals: Goal TPN rate is 60 mL/hr (provides 69.9 g of protein and 1597 kcals per day)  RD Assessment: Estimated Needs Total Energy Estimated Needs: 1530-1760 Total Protein Estimated Needs: 68-88 g Total Fluid Estimated Needs: 1.5-1.7L/d  Current Nutrition:  NPO  Plan:  Start TPN at 30mL/hr at 1800 - to provide 50% of needs on D1 of TPN Electrolytes in TPN: Na 50 mEq/L, K 64 mEq/L, Ca 5 mEq/L, Mg 5 mEq/L, and Phos 15 mmol/L. Cl:Ac 1:2 Add standard MVI and trace elements to TPN Initiate Sensitive q4h SSI tonight and adjust as needed  Reduce MIVF to 70 mL/hr at 1800 Kcl 10 meq IV x 4 per providers this AM  Monitor TPN labs on Mon/Thurs, daily until stable  Thiamine 100 mg x7 days in TPN (D1/7)  Sharyne Glatter, PharmD, BCCCP Critical Care Clinical Pharmacist 09/12/2024 12:35 PM

## 2024-09-12 NOTE — H&P (View-Only) (Signed)
 Subjective: CC: Patient reports abdominal pain improved since admission but still with central abdominal pain (epigastric, periumbilical and suprapubic) and bloating. No nausea. NGT w/ 550cc/24 hours, dark bilious. No flatus or BM. Reports hx of abdominal hysterectomy. She is on Plavix  but last dose was ~1 week ago. She did have a recent admission at [facility] for n/v on 11/11.  Objective: Vital signs in last 24 hours: Temp:  [97.7 F (36.5 C)-98.2 F (36.8 C)] 98.1 F (36.7 C) (11/20 0752) Pulse Rate:  [88-100] 88 (11/20 0752) Resp:  [14-20] 16 (11/20 0752) BP: (118-138)/(59-73) 120/59 (11/20 0752) SpO2:  [92 %-95 %] 92 % (11/20 0752) Last BM Date : 09/03/24  Intake/Output from previous day: 11/19 0701 - 11/20 0700 In: -  Out: 550 [Emesis/NG output:550] Intake/Output this shift: No intake/output data recorded.  PE: Gen:  Alert, NAD, pleasant Abd: Soft, mild distension, epigastric, peri-umbilical and suprapubic ttp without rigidity or guarding. NGT w/ dark bilious output.   Lab Results:  Recent Labs    09/11/24 0302 09/12/24 0401  WBC 10.0 9.0  HGB 11.7* 12.0  HCT 35.5* 38.0  PLT 159 160   BMET Recent Labs    09/11/24 0302 09/12/24 0401  NA 135 141  K 3.8 3.3*  CL 98 102  CO2 25 21*  GLUCOSE 99 94  BUN 17 15  CREATININE 0.77 0.94  CALCIUM  8.2* 8.6*   PT/INR No results for input(s): LABPROT, INR in the last 72 hours. CMP     Component Value Date/Time   NA 141 09/12/2024 0401   K 3.3 (L) 09/12/2024 0401   CL 102 09/12/2024 0401   CO2 21 (L) 09/12/2024 0401   GLUCOSE 94 09/12/2024 0401   BUN 15 09/12/2024 0401   CREATININE 0.94 09/12/2024 0401   CALCIUM  8.6 (L) 09/12/2024 0401   PROT 6.0 (L) 09/11/2024 0302   ALBUMIN 3.1 (L) 09/11/2024 0302   AST 16 09/11/2024 0302   ALT 11 09/11/2024 0302   ALKPHOS 48 09/11/2024 0302   BILITOT 1.1 09/11/2024 0302   GFRNONAA >60 09/12/2024 0401   GFRAA >60 06/11/2019 1215   Lipase     Component Value  Date/Time   LIPASE 53 (H) 09/10/2024 2002    Studies/Results: DG Abd Portable 1V-Small Bowel Obstruction Protocol-24 hr delay Result Date: 09/12/2024 EXAM: 1 VIEW XRAY OF THE ABDOMEN 09/12/2024 06:30:00 AM COMPARISON: 09/11/2024 CLINICAL HISTORY: SBO (small bowel obstruction) (HCC) FINDINGS: LINES, TUBES AND DEVICES: Enteric tube in place with distal tip and side port terminating within the expected location of the gastric fundus and body. Bilateral common iliac artery stents present. BOWEL: Multiple dilated small bowel loops unchanged. SOFT TISSUES: No opaque urinary calculi. BONES: No acute osseous abnormality. IMPRESSION: 1. Multiple dilated small bowel loops consistent with small bowel obstruction, unchanged from prior examination. Electronically signed by: Waddell Calk MD 09/12/2024 07:26 AM EST RP Workstation: [id number]   DG Abd Portable 1V-Small Bowel Obstruction Protocol-initial, 8 hr delay Result Date: 09/11/2024 CLINICAL DATA:  Small bowel obstruction. EXAM: PORTABLE ABDOMEN - 1 VIEW COMPARISON:  September 11, 2024 (1:10 a.m.) FINDINGS: There is stable nasogastric tube positioning with its distal tip seen within the body of the stomach. Numerous dilated loops of air-filled small bowel are seen throughout the abdomen. This is unchanged severity when compared to the prior study. No radio-opaque calculi or other significant radiographic abnormality are seen. IMPRESSION: 1. Stable nasogastric tube positioning. 2. Stable small bowel obstruction. Electronically Signed   By: Suzen  Houston M.D.   On: 09/11/2024 19:35   DG Abd Portable 1 View Result Date: 09/11/2024 EXAM: 1 VIEW XRAY OF THE ABDOMEN 09/11/2024 01:15:02 AM COMPARISON: 09/10/2024 CLINICAL HISTORY: NG placement. FINDINGS: LINES, TUBES AND DEVICES: Enteric tube advanced with tip in the stomach. BOWEL: Dilated small bowel loops in abdomen. SOFT TISSUES: No opaque urinary calculi. BONES: No acute osseous abnormality. IMPRESSION: 1.  Dilated small bowel loops, concerning for small bowel obstruction. 2. Enteric tube in good position. Electronically signed by: Morgane Naveau MD 09/11/2024 01:24 AM EST RP Workstation: [id number]   DG Abd Portable 1 View Result Date: 09/10/2024 CLINICAL DATA:  NG tube EXAM: PORTABLE ABDOMEN - 1 VIEW COMPARISON:  Mixed field FINDINGS: nasogastric tube tip is looped on itself with tip projecting cranially. The tube is located in the distal esophagus. There are multiple air-filled dilated small bowel compatible with small-bowel obstruction similar IMPRESSION: Nasogastric tube tip is looped on itself with tip projecting cranially. The tube is located in the distal esophagus. Recommend repositioning. Electronically Signed   By: Greig Pique M.D.   On: 09/10/2024 23:40   CT Angio Abd/Pel w/ and/or w/o Result Date: 09/10/2024 EXAM: CTA ABDOMEN AND PELVIS WITHOUT AND WITH CONTRAST 09/10/2024 02:07:41 PM TECHNIQUE: CTA images of the abdomen and pelvis without and with intravenous contrast. 100mL iohexol  (OMNIPAQUE ) 350 MG/ML injection 100 mL IOHEXOL  350 MG/ML SOLN was administered. Three-dimensional MIP/volume rendered formations were performed. Automated exposure control, iterative reconstruction, and/or weight based adjustment of the mA/kV was utilized to reduce the radiation dose to as low as reasonably achievable. COMPARISON: 09/03/2024 CLINICAL HISTORY: FINDINGS: VASCULATURE: AORTA: Aortic atherosclerosis. No abdominal aortic aneurysm. No dissection. CELIAC TRUNK: No acute finding. No occlusion or significant stenosis. SUPERIOR MESENTERIC ARTERY: No acute finding. No occlusion or significant stenosis. RENAL ARTERIES: Moderate narrowing noted at origin of right renal artery secondary to calcified plaque. The left renal artery demonstrates no acute finding, occlusion, or significant stenosis. ILIAC ARTERIES: Patent stents in both common iliac arteries. Moderate narrowing is seen involving both common femoral  artery secondary to calcified plaque. LIVER: The liver is unremarkable. GALLBLADDER AND BILE DUCTS: Gallbladder is unremarkable. No biliary ductal dilatation. SPLEEN: The spleen is unremarkable. PANCREAS: The pancreas is unremarkable. ADRENAL GLANDS: Bilateral adrenal glands demonstrate no acute abnormality. KIDNEYS, URETERS AND BLADDER: No stones in the kidneys or ureters. No hydronephrosis. No perinephric or periureteral stranding. Urinary bladder is unremarkable. GI AND BOWEL: Severe small bowel dilatation is noted with transitioning zone seen in the pelvis, seen on image 68, series 14. This is concerning for possible adhesion. No colonic dilatation is noted. Stomach and duodenal sweep demonstrate no acute abnormality. No abnormal bowel wall thickening or distension. REPRODUCTIVE: Status post hysterectomy. PERITONEUM AND RETRPERITONEUM: Mild amount of free fluid is noted in the pelvis. No free air. LUNG BASE: No acute abnormality. LYMPH NODES: No lymphadenopathy. BONES AND SOFT TISSUES: No acute abnormality of the bones. No acute soft tissue abnormality. IMPRESSION: 1. Severe small bowel obstruction with a transition zone in the pelvis, concerning for adhesion. 2. Moderate atherosclerotic narrowing at the origin of the right renal artery. 3. Moderate atherosclerotic narrowing involving both common femoral arteries. 4. Patent stents in both common iliac arteries. Electronically signed by: Lynwood Seip MD 09/10/2024 02:42 PM EST RP Workstation: [id number]    Anti-infectives: Anti-infectives (From admission, onward)    None        Assessment/Plan SBO - CT 11/18 with severe SBO with transition in the pelvis - Reports hx of abdominal hysterectomy.  She did have a recent admission at for n/v on 11/11. - HDS without fever, tachycardia or systolic hypotension. No peritonitis on exam. WBC 9. No current indication for emergency surgery - Continue NGT for decompression and keep NPO - Xray this AM with  continued dilated loops of small bowel. I cannot see contrast on xray - may have diluted or been suctioned back out. Wil repeat gastrografin today.  - Keep K >=4, Phos >= 3, Mg >= 2 and mobilize for bowel function - Hopefully patient will improve with conservative management. If patient fails to improve with conservative management, they may require exploratory surgery during admission - this could be as early as tomorrow if not improved. Would ask [facility] to optimize patient incase she needs OR during admission.  - Start PICC/TPN today given duration of being NPO - We will follow with you.   FEN: NPO, NGT to LIWS, TPN, IVF per [facility], PPI for prior bloody drainage in NGT. Replace hypokalemia  VTE: discontinued plavix , LMWH ID: no current abx  - per [facility] -  COPD HTN Migraines PVD Carotid stenosis s/p endarterectomy - on plavix , reports her last dose was ~1 week ago THC use   I reviewed nursing notes, hospitalist notes, last 24 h vitals and pain scores, last 48 h intake and output, last 24 h labs and trends, and last 24 h imaging results.   LOS: 2 days    Ozell CHRISTELLA Shaper, [facility] 09/12/2024, 11:08 AM Please see Amion for pager number during day hours 7:00am-4:30pm

## 2024-09-12 NOTE — Progress Notes (Addendum)
 Subjective: CC: Patient reports abdominal pain improved since admission but still with central abdominal pain (epigastric, periumbilical and suprapubic) and bloating. No nausea. NGT w/ 550cc/24 hours, dark bilious. No flatus or BM. Reports hx of abdominal hysterectomy. She is on Plavix  but last dose was ~1 week ago. She did have a recent admission at [facility] for n/v on 11/11.  Objective: Vital signs in last 24 hours: Temp:  [97.7 F (36.5 C)-98.2 F (36.8 C)] 98.1 F (36.7 C) (11/20 0752) Pulse Rate:  [88-100] 88 (11/20 0752) Resp:  [14-20] 16 (11/20 0752) BP: (118-138)/(59-73) 120/59 (11/20 0752) SpO2:  [92 %-95 %] 92 % (11/20 0752) Last BM Date : 09/03/24  Intake/Output from previous day: 11/19 0701 - 11/20 0700 In: -  Out: 550 [Emesis/NG output:550] Intake/Output this shift: No intake/output data recorded.  PE: Gen:  Alert, NAD, pleasant Abd: Soft, mild distension, epigastric, peri-umbilical and suprapubic ttp without rigidity or guarding. NGT w/ dark bilious output.   Lab Results:  Recent Labs    09/11/24 0302 09/12/24 0401  WBC 10.0 9.0  HGB 11.7* 12.0  HCT 35.5* 38.0  PLT 159 160   BMET Recent Labs    09/11/24 0302 09/12/24 0401  NA 135 141  K 3.8 3.3*  CL 98 102  CO2 25 21*  GLUCOSE 99 94  BUN 17 15  CREATININE 0.77 0.94  CALCIUM  8.2* 8.6*   PT/INR No results for input(s): LABPROT, INR in the last 72 hours. CMP     Component Value Date/Time   NA 141 09/12/2024 0401   K 3.3 (L) 09/12/2024 0401   CL 102 09/12/2024 0401   CO2 21 (L) 09/12/2024 0401   GLUCOSE 94 09/12/2024 0401   BUN 15 09/12/2024 0401   CREATININE 0.94 09/12/2024 0401   CALCIUM  8.6 (L) 09/12/2024 0401   PROT 6.0 (L) 09/11/2024 0302   ALBUMIN 3.1 (L) 09/11/2024 0302   AST 16 09/11/2024 0302   ALT 11 09/11/2024 0302   ALKPHOS 48 09/11/2024 0302   BILITOT 1.1 09/11/2024 0302   GFRNONAA >60 09/12/2024 0401   GFRAA >60 06/11/2019 1215   Lipase     Component Value  Date/Time   LIPASE 53 (H) 09/10/2024 2002    Studies/Results: DG Abd Portable 1V-Small Bowel Obstruction Protocol-24 hr delay Result Date: 09/12/2024 EXAM: 1 VIEW XRAY OF THE ABDOMEN 09/12/2024 06:30:00 AM COMPARISON: 09/11/2024 CLINICAL HISTORY: SBO (small bowel obstruction) (HCC) FINDINGS: LINES, TUBES AND DEVICES: Enteric tube in place with distal tip and side port terminating within the expected location of the gastric fundus and body. Bilateral common iliac artery stents present. BOWEL: Multiple dilated small bowel loops unchanged. SOFT TISSUES: No opaque urinary calculi. BONES: No acute osseous abnormality. IMPRESSION: 1. Multiple dilated small bowel loops consistent with small bowel obstruction, unchanged from prior examination. Electronically signed by: Waddell Calk MD 09/12/2024 07:26 AM EST RP Workstation: [id number]   DG Abd Portable 1V-Small Bowel Obstruction Protocol-initial, 8 hr delay Result Date: 09/11/2024 CLINICAL DATA:  Small bowel obstruction. EXAM: PORTABLE ABDOMEN - 1 VIEW COMPARISON:  September 11, 2024 (1:10 a.m.) FINDINGS: There is stable nasogastric tube positioning with its distal tip seen within the body of the stomach. Numerous dilated loops of air-filled small bowel are seen throughout the abdomen. This is unchanged severity when compared to the prior study. No radio-opaque calculi or other significant radiographic abnormality are seen. IMPRESSION: 1. Stable nasogastric tube positioning. 2. Stable small bowel obstruction. Electronically Signed   By: Suzen  Houston M.D.   On: 09/11/2024 19:35   DG Abd Portable 1 View Result Date: 09/11/2024 EXAM: 1 VIEW XRAY OF THE ABDOMEN 09/11/2024 01:15:02 AM COMPARISON: 09/10/2024 CLINICAL HISTORY: NG placement. FINDINGS: LINES, TUBES AND DEVICES: Enteric tube advanced with tip in the stomach. BOWEL: Dilated small bowel loops in abdomen. SOFT TISSUES: No opaque urinary calculi. BONES: No acute osseous abnormality. IMPRESSION: 1.  Dilated small bowel loops, concerning for small bowel obstruction. 2. Enteric tube in good position. Electronically signed by: Morgane Naveau MD 09/11/2024 01:24 AM EST RP Workstation: [id number]   DG Abd Portable 1 View Result Date: 09/10/2024 CLINICAL DATA:  NG tube EXAM: PORTABLE ABDOMEN - 1 VIEW COMPARISON:  Mixed field FINDINGS: nasogastric tube tip is looped on itself with tip projecting cranially. The tube is located in the distal esophagus. There are multiple air-filled dilated small bowel compatible with small-bowel obstruction similar IMPRESSION: Nasogastric tube tip is looped on itself with tip projecting cranially. The tube is located in the distal esophagus. Recommend repositioning. Electronically Signed   By: Greig Pique M.D.   On: 09/10/2024 23:40   CT Angio Abd/Pel w/ and/or w/o Result Date: 09/10/2024 EXAM: CTA ABDOMEN AND PELVIS WITHOUT AND WITH CONTRAST 09/10/2024 02:07:41 PM TECHNIQUE: CTA images of the abdomen and pelvis without and with intravenous contrast. 100mL iohexol  (OMNIPAQUE ) 350 MG/ML injection 100 mL IOHEXOL  350 MG/ML SOLN was administered. Three-dimensional MIP/volume rendered formations were performed. Automated exposure control, iterative reconstruction, and/or weight based adjustment of the mA/kV was utilized to reduce the radiation dose to as low as reasonably achievable. COMPARISON: 09/03/2024 CLINICAL HISTORY: FINDINGS: VASCULATURE: AORTA: Aortic atherosclerosis. No abdominal aortic aneurysm. No dissection. CELIAC TRUNK: No acute finding. No occlusion or significant stenosis. SUPERIOR MESENTERIC ARTERY: No acute finding. No occlusion or significant stenosis. RENAL ARTERIES: Moderate narrowing noted at origin of right renal artery secondary to calcified plaque. The left renal artery demonstrates no acute finding, occlusion, or significant stenosis. ILIAC ARTERIES: Patent stents in both common iliac arteries. Moderate narrowing is seen involving both common femoral  artery secondary to calcified plaque. LIVER: The liver is unremarkable. GALLBLADDER AND BILE DUCTS: Gallbladder is unremarkable. No biliary ductal dilatation. SPLEEN: The spleen is unremarkable. PANCREAS: The pancreas is unremarkable. ADRENAL GLANDS: Bilateral adrenal glands demonstrate no acute abnormality. KIDNEYS, URETERS AND BLADDER: No stones in the kidneys or ureters. No hydronephrosis. No perinephric or periureteral stranding. Urinary bladder is unremarkable. GI AND BOWEL: Severe small bowel dilatation is noted with transitioning zone seen in the pelvis, seen on image 68, series 14. This is concerning for possible adhesion. No colonic dilatation is noted. Stomach and duodenal sweep demonstrate no acute abnormality. No abnormal bowel wall thickening or distension. REPRODUCTIVE: Status post hysterectomy. PERITONEUM AND RETRPERITONEUM: Mild amount of free fluid is noted in the pelvis. No free air. LUNG BASE: No acute abnormality. LYMPH NODES: No lymphadenopathy. BONES AND SOFT TISSUES: No acute abnormality of the bones. No acute soft tissue abnormality. IMPRESSION: 1. Severe small bowel obstruction with a transition zone in the pelvis, concerning for adhesion. 2. Moderate atherosclerotic narrowing at the origin of the right renal artery. 3. Moderate atherosclerotic narrowing involving both common femoral arteries. 4. Patent stents in both common iliac arteries. Electronically signed by: Lynwood Seip MD 09/10/2024 02:42 PM EST RP Workstation: [id number]    Anti-infectives: Anti-infectives (From admission, onward)    None        Assessment/Plan SBO - CT 11/18 with severe SBO with transition in the pelvis - Reports hx of abdominal hysterectomy.  She did have a recent admission at for n/v on 11/11. - HDS without fever, tachycardia or systolic hypotension. No peritonitis on exam. WBC 9. No current indication for emergency surgery - Continue NGT for decompression and keep NPO - Xray this AM with  continued dilated loops of small bowel. I cannot see contrast on xray - may have diluted or been suctioned back out. Wil repeat gastrografin today.  - Keep K >=4, Phos >= 3, Mg >= 2 and mobilize for bowel function - Hopefully patient will improve with conservative management. If patient fails to improve with conservative management, they may require exploratory surgery during admission - this could be as early as tomorrow if not improved. Would ask [facility] to optimize patient incase she needs OR during admission.  - Start PICC/TPN today given duration of being NPO - We will follow with you.   FEN: NPO, NGT to LIWS, TPN, IVF per [facility], PPI for prior bloody drainage in NGT. Replace hypokalemia  VTE: discontinued plavix , LMWH ID: no current abx  - per [facility] -  COPD HTN Migraines PVD Carotid stenosis s/p endarterectomy - on plavix , reports her last dose was ~1 week ago THC use   I reviewed nursing notes, hospitalist notes, last 24 h vitals and pain scores, last 48 h intake and output, last 24 h labs and trends, and last 24 h imaging results.   LOS: 2 days    Jasmine Acosta, [facility] 09/12/2024, 11:08 AM Please see Amion for pager number during day hours 7:00am-4:30pm

## 2024-09-12 NOTE — Progress Notes (Signed)
 Peripherally Inserted Central Catheter Placement  The IV Nurse has discussed with the patient and/or persons authorized to consent for the patient, the purpose of this procedure and the potential benefits and risks involved with this procedure.  The benefits include less needle sticks, lab draws from the catheter, and the patient may be discharged home with the catheter. Risks include, but not limited to, infection, bleeding, blood clot (thrombus formation), and puncture of an artery; nerve damage and irregular heartbeat and possibility to perform a PICC exchange if needed/ordered by physician.  Alternatives to this procedure were also discussed.  Bard Power PICC patient education guide, fact sheet on infection prevention and patient information card has been provided to patient /or left at bedside.    PICC Placement Documentation  PICC Double Lumen 09/12/24 Right Brachial 37 cm 0 cm (Active)  Indication for Insertion or Continuance of Line Administration of hyperosmolar/irritating solutions (i.e. TPN, Vancomycin, etc.) 09/12/24 1416  Exposed Catheter (cm) 0 cm 09/12/24 1416  Site Assessment Clean, Dry, Intact 09/12/24 1416  Lumen #1 Status Flushed;Saline locked;Blood return noted 09/12/24 1416  Lumen #2 Status Flushed;Infusing;Blood return noted 09/12/24 1416  Dressing Type Transparent;Securing device 09/12/24 1416  Dressing Status Antimicrobial disc/dressing in place;Clean, Dry, Intact 09/12/24 1416  Line Care Connections checked and tightened 09/12/24 1416  Line Adjustment (NICU/IV Team Only) No 09/12/24 1416  Dressing Intervention New dressing;Adhesive placed at insertion site (IV team only) 09/12/24 1416  Dressing Change Due 09/19/24 09/12/24 1416       Renaee Notice Albarece 09/12/2024, 2:35 PM

## 2024-09-12 NOTE — Progress Notes (Signed)
 On call contacted overnight re: pain & IVF  See emar for one time dilaudid  admin, effective  Per MD OK to continue IVF overnight, as per [facility] AM note

## 2024-09-13 ENCOUNTER — Encounter (HOSPITAL_COMMUNITY)
Admission: EM | Disposition: A | Discharge status: Home / Self Care | Payer: Self-pay | Source: Home / Self Care | Attending: Internal Medicine

## 2024-09-13 ENCOUNTER — Inpatient Hospital Stay (HOSPITAL_COMMUNITY): Admitting: Anesthesiology

## 2024-09-13 ENCOUNTER — Encounter (HOSPITAL_COMMUNITY): Payer: Self-pay | Admitting: Student

## 2024-09-13 ENCOUNTER — Inpatient Hospital Stay (HOSPITAL_COMMUNITY)

## 2024-09-13 DIAGNOSIS — Z4682 Encounter for fitting and adjustment of non-vascular catheter: Secondary | ICD-10-CM | POA: Diagnosis not present

## 2024-09-13 DIAGNOSIS — I10 Essential (primary) hypertension: Secondary | ICD-10-CM | POA: Diagnosis not present

## 2024-09-13 DIAGNOSIS — K565 Intestinal adhesions [bands], unspecified as to partial versus complete obstruction: Secondary | ICD-10-CM

## 2024-09-13 DIAGNOSIS — K56609 Unspecified intestinal obstruction, unspecified as to partial versus complete obstruction: Secondary | ICD-10-CM | POA: Diagnosis not present

## 2024-09-13 DIAGNOSIS — E43 Unspecified severe protein-calorie malnutrition: Secondary | ICD-10-CM | POA: Insufficient documentation

## 2024-09-13 DIAGNOSIS — I2510 Atherosclerotic heart disease of native coronary artery without angina pectoris: Secondary | ICD-10-CM

## 2024-09-13 DIAGNOSIS — Z87891 Personal history of nicotine dependence: Secondary | ICD-10-CM

## 2024-09-13 HISTORY — PX: LAPAROTOMY: SHX154

## 2024-09-13 LAB — BASIC METABOLIC PANEL WITH GFR
Anion gap: 10 (ref 5–15)
Anion gap: 8 (ref 5–15)
BUN: 13 mg/dL (ref 8–23)
BUN: 9 mg/dL (ref 8–23)
CO2: 28 mmol/L (ref 22–32)
CO2: 27 mmol/L (ref 22–32)
Calcium: 8.5 mg/dL — ABNORMAL LOW (ref 8.9–10.3)
Calcium: 7.7 mg/dL — ABNORMAL LOW (ref 8.9–10.3)
Chloride: 104 mmol/L (ref 98–111)
Chloride: 106 mmol/L (ref 98–111)
Creatinine, Ser: 0.7 mg/dL (ref 0.44–1.00)
Creatinine, Ser: 0.7 mg/dL (ref 0.44–1.00)
GFR, Estimated: >60 mL/min (ref >60)
GFR, Estimated: >60 mL/min (ref >60)
Glucose, Bld: 149 mg/dL — ABNORMAL HIGH (ref 70–99)
Glucose, Bld: 163 mg/dL — ABNORMAL HIGH (ref 70–99)
Potassium: 3.3 mmol/L — ABNORMAL LOW (ref 3.5–5.1)
Potassium: 3.9 mmol/L (ref 3.5–5.1)
Sodium: 142 mmol/L (ref 135–145)
Sodium: 141 mmol/L (ref 135–145)

## 2024-09-13 LAB — MAGNESIUM: Magnesium: 2.1 mg/dL (ref 1.7–2.4)

## 2024-09-13 LAB — CBC
HCT: 36.6 % (ref 36.0–46.0)
Hemoglobin: 11.7 g/dL — ABNORMAL LOW (ref 12.0–15.0)
MCH: 29.6 pg (ref 26.0–34.0)
MCHC: 32 g/dL (ref 30.0–36.0)
MCV: 92.7 fL (ref 80.0–100.0)
Platelets: 177 K/uL (ref 150–400)
RBC: 3.95 MIL/uL (ref 3.87–5.11)
RDW: 12.4 % (ref 11.5–15.5)
WBC: 8 K/uL (ref 4.0–10.5)
nRBC: 0 % (ref 0.0–0.2)

## 2024-09-13 LAB — TYPE AND SCREEN
ABO/RH(D): AB POS
Antibody Screen: NEGATIVE

## 2024-09-13 LAB — GLUCOSE, CAPILLARY
Glucose-Capillary: 152 mg/dL — ABNORMAL HIGH (ref 70–99)
Glucose-Capillary: 144 mg/dL — ABNORMAL HIGH (ref 70–99)
Glucose-Capillary: 131 mg/dL — ABNORMAL HIGH (ref 70–99)
Glucose-Capillary: 152 mg/dL — ABNORMAL HIGH (ref 70–99)
Glucose-Capillary: 127 mg/dL — ABNORMAL HIGH (ref 70–99)
Glucose-Capillary: 160 mg/dL — ABNORMAL HIGH (ref 70–99)

## 2024-09-13 LAB — PHOSPHORUS
Phosphorus: 2.1 mg/dL — ABNORMAL LOW (ref 2.5–4.6)
Phosphorus: 3.5 mg/dL (ref 2.5–4.6)

## 2024-09-13 LAB — TRIGLYCERIDES: Triglycerides: 60 mg/dL (ref <150)

## 2024-09-13 SURGERY — LAPAROTOMY, EXPLORATORY
Anesthesia: General | Site: Abdomen

## 2024-09-13 MED ORDER — KETAMINE HCL 50 MG/5ML IJ SOSY
PREFILLED_SYRINGE | INTRAMUSCULAR | Status: AC
  Filled 2024-09-13: qty 5

## 2024-09-13 MED ORDER — LIDOCAINE 2% (20 MG/ML) 5 ML SYRINGE
INTRAMUSCULAR | Status: DC | PRN
Start: 2024-09-13 — End: 2024-09-13
  Administered 2024-09-13: 80 mg via INTRAVENOUS

## 2024-09-13 MED ORDER — KETAMINE HCL 50 MG/5ML IJ SOSY
PREFILLED_SYRINGE | INTRAMUSCULAR | Status: DC | PRN
Start: 2024-09-13 — End: 2024-09-13
  Administered 2024-09-13: 10 mg via INTRAVENOUS
  Administered 2024-09-13: 20 mg via INTRAVENOUS

## 2024-09-13 MED ORDER — 0.9 % SODIUM CHLORIDE (POUR BTL) OPTIME
TOPICAL | Status: DC | PRN
  Administered 2024-09-13: 2000 mL

## 2024-09-13 MED ORDER — HYDROMORPHONE HCL 1 MG/ML IJ SOLN
INTRAMUSCULAR | Status: AC
  Filled 2024-09-13: qty 1

## 2024-09-13 MED ORDER — ROCURONIUM BROMIDE 10 MG/ML (PF) SYRINGE
PREFILLED_SYRINGE | INTRAVENOUS | Status: DC | PRN
Start: 2024-09-13 — End: 2024-09-13
  Administered 2024-09-13: 40 mg via INTRAVENOUS

## 2024-09-13 MED ORDER — PROPOFOL 10 MG/ML IV BOLUS
INTRAVENOUS | Status: AC
  Filled 2024-09-13: qty 20

## 2024-09-13 MED ORDER — ACETAMINOPHEN 10 MG/ML IV SOLN
INTRAVENOUS | Status: AC
  Filled 2024-09-13: qty 100

## 2024-09-13 MED ORDER — FENTANYL CITRATE (PF) 100 MCG/2ML IJ SOLN
INTRAMUSCULAR | Status: AC
  Filled 2024-09-13: qty 2

## 2024-09-13 MED ORDER — PHENYLEPHRINE HCL-NACL 20-0.9 MG/250ML-% IV SOLN
INTRAVENOUS | Status: DC | PRN
  Administered 2024-09-13: 25 ug/min via INTRAVENOUS

## 2024-09-13 MED ORDER — OXYCODONE HCL 5 MG PO TABS: 5.0000 mg | ORAL_TABLET | Freq: Once | ORAL | Status: DC | PRN

## 2024-09-13 MED ORDER — CEFAZOLIN SODIUM-DEXTROSE 2-4 GM/100ML-% IV SOLN
2.0000 g | INTRAVENOUS | Status: AC
  Administered 2024-09-13: 2 g via INTRAVENOUS
  Filled 2024-09-13: qty 100

## 2024-09-13 MED ORDER — FENTANYL CITRATE (PF) 250 MCG/5ML IJ SOLN
INTRAMUSCULAR | Status: DC | PRN
Start: 2024-09-13 — End: 2024-09-13
  Administered 2024-09-13 (×2): 50 ug via INTRAVENOUS

## 2024-09-13 MED ORDER — PHENYLEPHRINE 80 MCG/ML (10ML) SYRINGE FOR IV PUSH (FOR BLOOD PRESSURE SUPPORT)
PREFILLED_SYRINGE | INTRAVENOUS | Status: AC
  Filled 2024-09-13: qty 10

## 2024-09-13 MED ORDER — SUGAMMADEX SODIUM 200 MG/2ML IV SOLN
INTRAVENOUS | Status: DC | PRN
  Administered 2024-09-13: 150 mg via INTRAVENOUS

## 2024-09-13 MED ORDER — ALBUMIN HUMAN 5 % IV SOLN: INTRAVENOUS | Status: DC | PRN

## 2024-09-13 MED ORDER — TRAVASOL 10 % IV SOLN
INTRAVENOUS | Status: AC
  Filled 2024-09-13: qty 349.9

## 2024-09-13 MED ORDER — HYDROMORPHONE HCL 1 MG/ML IJ SOLN
INTRAMUSCULAR | Status: AC
  Filled 2024-09-13: qty 0.5

## 2024-09-13 MED ORDER — LIDOCAINE 2% (20 MG/ML) 5 ML SYRINGE
INTRAMUSCULAR | Status: AC
  Filled 2024-09-13: qty 5

## 2024-09-13 MED ORDER — ONDANSETRON HCL 4 MG/2ML IJ SOLN
INTRAMUSCULAR | Status: AC
  Filled 2024-09-13: qty 2

## 2024-09-13 MED ORDER — ONDANSETRON HCL 4 MG/2ML IJ SOLN
INTRAMUSCULAR | Status: DC | PRN
Start: 2024-09-13 — End: 2024-09-13
  Administered 2024-09-13: 4 mg via INTRAVENOUS

## 2024-09-13 MED ORDER — DEXAMETHASONE SOD PHOSPHATE PF 10 MG/ML IJ SOLN
INTRAMUSCULAR | Status: DC | PRN
Start: 2024-09-13 — End: 2024-09-13
  Administered 2024-09-13: 5 mg via INTRAVENOUS

## 2024-09-13 MED ORDER — ROCURONIUM BROMIDE 10 MG/ML (PF) SYRINGE
PREFILLED_SYRINGE | INTRAVENOUS | Status: AC
  Filled 2024-09-13: qty 10

## 2024-09-13 MED ORDER — OXYCODONE HCL 5 MG/5ML PO SOLN: 5.0000 mg | Freq: Once | ORAL | Status: DC | PRN

## 2024-09-13 MED ORDER — HYDROMORPHONE HCL 1 MG/ML IJ SOLN
INTRAMUSCULAR | Status: DC | PRN
  Administered 2024-09-13 (×2): .25 mg via INTRAVENOUS

## 2024-09-13 MED ORDER — POTASSIUM PHOSPHATES 15 MMOLE/5ML IV SOLN
30.0000 mmol | Freq: Once | INTRAVENOUS | Status: AC
  Administered 2024-09-13: 30 mmol via INTRAVENOUS
  Filled 2024-09-13: qty 10

## 2024-09-13 MED ORDER — ONDANSETRON HCL 4 MG/2ML IJ SOLN: 4.0000 mg | Freq: Once | INTRAMUSCULAR | Status: DC | PRN

## 2024-09-13 MED ORDER — HYDROMORPHONE HCL 1 MG/ML IJ SOLN
0.5000 mg | INTRAMUSCULAR | Status: DC | PRN
  Administered 2024-09-13 – 2024-09-14 (×4): 0.5 mg via INTRAVENOUS
  Filled 2024-09-13 (×4): qty 0.5

## 2024-09-13 MED ORDER — EPHEDRINE 5 MG/ML INJ
INTRAVENOUS | Status: AC
  Filled 2024-09-13: qty 5

## 2024-09-13 MED ORDER — PROPOFOL 10 MG/ML IV BOLUS
INTRAVENOUS | Status: DC | PRN
  Administered 2024-09-13: 100 mg via INTRAVENOUS

## 2024-09-13 MED ORDER — HYDROMORPHONE HCL 1 MG/ML IJ SOLN
0.2500 mg | INTRAMUSCULAR | Status: DC | PRN
  Administered 2024-09-13 (×4): 0.5 mg via INTRAVENOUS

## 2024-09-13 MED ORDER — ORAL CARE MOUTH RINSE: 15.0000 mL | Freq: Once | OROMUCOSAL | Status: AC

## 2024-09-13 MED ORDER — AMISULPRIDE (ANTIEMETIC) 5 MG/2ML IV SOLN: 10.0000 mg | Freq: Once | INTRAVENOUS | Status: DC | PRN

## 2024-09-13 MED ORDER — LACTATED RINGERS IV SOLN: INTRAVENOUS | Status: DC

## 2024-09-13 MED ORDER — ACETAMINOPHEN 10 MG/ML IV SOLN
1000.0000 mg | Freq: Once | INTRAVENOUS | Status: DC | PRN
  Administered 2024-09-13: 1000 mg via INTRAVENOUS

## 2024-09-13 MED ORDER — CHLORHEXIDINE GLUCONATE 0.12 % MT SOLN
15.0000 mL | Freq: Once | OROMUCOSAL | Status: AC
  Administered 2024-09-13: 15 mL via OROMUCOSAL
  Filled 2024-09-13 (×2): qty 15

## 2024-09-13 MED ORDER — POTASSIUM CHLORIDE 10 MEQ/50ML IV SOLN
10.0000 meq | INTRAVENOUS | Status: AC
Start: 2024-09-13 — End: 2024-09-13
  Administered 2024-09-13 (×2): 10 meq via INTRAVENOUS
  Filled 2024-09-13 (×3): qty 50

## 2024-09-13 SURGICAL SUPPLY — 35 items
BAG COUNTER SPONGE SURGICOUNT (BAG) ×2 IMPLANT
CANISTER SUCTION 3000ML PPV (SUCTIONS) ×2 IMPLANT
CHLORAPREP W/TINT 26 (MISCELLANEOUS) ×2 IMPLANT
COVER SURGICAL LIGHT HANDLE (MISCELLANEOUS) ×2 IMPLANT
DRAPE LAPAROSCOPIC ABDOMINAL (DRAPES) ×2 IMPLANT
DRAPE WARM FLUID 44X44 (DRAPES) ×2 IMPLANT
DRSG OPSITE POSTOP 3X4 (GAUZE/BANDAGES/DRESSINGS) IMPLANT
DRSG OPSITE POSTOP 4X8 (GAUZE/BANDAGES/DRESSINGS) IMPLANT
ELECT BLADE 6.5 EXT (BLADE) IMPLANT
ELECT CAUTERY BLADE 6.4 (BLADE) ×4 IMPLANT
ELECTRODE REM PT RTRN 9FT ADLT (ELECTROSURGICAL) ×2 IMPLANT
GLOVE BIO SURGEON STRL SZ 6 (GLOVE) ×4 IMPLANT
GLOVE INDICATOR 6.5 STRL GRN (GLOVE) ×2 IMPLANT
GOWN STRL REUS W/ TWL LRG LVL3 (GOWN DISPOSABLE) ×4 IMPLANT
GOWN STRL REUS W/ TWL XL LVL3 (GOWN DISPOSABLE) ×2 IMPLANT
HANDLE SUCTION POOLE (INSTRUMENTS) ×2 IMPLANT
KIT BASIN OR (CUSTOM PROCEDURE TRAY) ×2 IMPLANT
KIT TURNOVER KIT B (KITS) ×2 IMPLANT
LIGASURE IMPACT 36 18CM CVD LR (INSTRUMENTS) IMPLANT
PACK GENERAL/GYN (CUSTOM PROCEDURE TRAY) ×2 IMPLANT
PAD ARMBOARD POSITIONER FOAM (MISCELLANEOUS) ×2 IMPLANT
RETAINER VISCERAL (MISCELLANEOUS) IMPLANT
SOLN 0.9% NACL POUR BTL 1000ML (IV SOLUTION) ×4 IMPLANT
SPECIMEN JAR LARGE (MISCELLANEOUS) ×2 IMPLANT
STAPLER SKIN PROX 35W (STAPLE) ×2 IMPLANT
SUT PDS AB 1 TP1 96 (SUTURE) ×4 IMPLANT
SUT PDS II 0 TP-1 LOOPED 60 (SUTURE) ×4 IMPLANT
SUT VIC AB 2-0 SH 18 (SUTURE) ×2 IMPLANT
SUT VIC AB 3-0 SH 18 (SUTURE) ×2 IMPLANT
SUT VICRYL AB 2 0 TIES (SUTURE) ×2 IMPLANT
SUT VICRYL AB 3 0 TIES (SUTURE) ×2 IMPLANT
TOWEL GREEN STERILE (TOWEL DISPOSABLE) ×2 IMPLANT
TOWEL GREEN STERILE FF (TOWEL DISPOSABLE) ×2 IMPLANT
TRAY FOLEY W/BAG SLVR 14FR (SET/KITS/TRAYS/PACK) IMPLANT
YANKAUER SUCT BULB TIP NO VENT (SUCTIONS) ×2 IMPLANT

## 2024-09-13 NOTE — Interval H&P Note (Signed)
 History and Physical Interval Note:  09/13/2024 8:25 AM  Jasmine Acosta  has presented today for surgery, with the diagnosis of SMALL BOWEL OBSTRUCTION.  The various methods of treatment have been discussed with the patient and family. After consideration of risks, benefits and other options for treatment, the patient has consented to  Procedure(s): LAPAROTOMY, EXPLORATORY (N/A) possible small bowel resection as a surgical intervention.  The patient's history has been reviewed, patient examined, no change in status, stable for surgery.  I have reviewed the patient's chart and labs.  Questions were answered to the patient's satisfaction.     Jina Nephew

## 2024-09-13 NOTE — Progress Notes (Signed)
 PROGRESS NOTE  Jasmine Acosta FMW:[id number] DOB: May 29, 1951 DOA: 09/10/2024 PCP: Lenon Layman ORN, MD   LOS: 3 days   Brief narrative:  Jasmine Acosta is a 73 y.o. female with past medical history significant for COPD, HTN, migraine, PVD, carotid artery stenosis s/p endarterectomy, PVD, hypokalemia substance use disorder presented hospital with intractable abdominal pain.  Of note patient was recently a presented to [facility] hospital for abdominal pain nausea and vomiting and subsequently to [facility] ED.  He was given antiemetics and had been discharged home but her symptoms continued to progress show was sent by her primary care provider after obtaining CT scan which showed small bowel obstruction.  In the ED, patient was afebrile but tachycardic.  Labs were notable for hypokalemia with potassium of 3.2 and mild leukocytosis at 12.3.  Urinalysis showed proteinuria.  EKG with normal sinus rhythm.  CT scan of abdomen and pelvis showed severe small bowel obstruction with transition zone in the pelvis.  Patient was given symptomatic care, general surgery was consulted and patient was considered for admission to the hospital for further evaluation and treatment.  At this time patient has had NG tube placed in and has been started on TPN.  Assessment/Plan: Principal Problem:   Small bowel obstruction (HCC) Active Problems:   Hypokalemia   Absolute anemia   Occlusion of carotid artery   Protein-calorie malnutrition, severe  Small bowel obstruction History of abdominal hysterectomy presented with abdominal pain, nausea and vomiting.  CT abdomen showed severe small bowel obstruction.  General surgery on board.  Continue n.p.o. IV fluids with normal saline at 70 mL/h.  Continue antiemetics, analgesics and supportive care.   Status post NG tube placement with some dark output..  Small bowel follow-through exam on 09/12/2024 without progression to the colon.  Patient has been started on  TPN as per general surgery.  Follow general surgery recommendations.  Might need surgical intervention if not improved.  Hypokalemia Will replenish aggressively.  Potassium today at 3.3.  Will replace with IV KCl.  Check levels in AM.  Hypophosphatemia.  Phosphorus of 2.1.  Will replace with K-Phos.   Essential hypertension. Oral medications on hold.  On IV as needed hydralazine  for   COPD. Continue DuoNebs.  Appears stable.   History of peripheral vascular disease with intermittent claudication  Carotid artery stenosis s/p endarterectomy Status post stenting with CT showing patent stents in both common iliac arteries. Continue Plavix  when p.o. able.  Currently n.p.o.   Vitamin B12 deficiency On vitamin B12 as outpatient.  Currently on hold.  Nutrition Status:Body mass index is 25.88 kg/m.  Nutrition Problem: Severe Malnutrition Etiology: acute illness Signs/Symptoms: energy intake < or equal to 50% for > or equal to 5 days, percent weight loss, moderate fat depletion, moderate muscle depletion Percent weight loss: 2.2 % Interventions: TPN, MVI Currently on TPN due to bowel obstruction.  DVT prophylaxis: enoxaparin  (LOVENOX ) injection 40 mg Start: 09/10/24 2130   Disposition: Home likely in 2 to 3 days.  Status is: Inpatient Remains inpatient appropriate because: Pending clinical improvement, initiation of TPN    Code Status:     Code Status: Limited: Do not attempt resuscitation (DNR) -DNR-LIMITED -Do Not Intubate/DNI   Family Communication: None at present  Consultants: General surgery  Procedures: NG tube placement PICC line placement  Anti-infectives:  None  Anti-infectives (From admission, onward)    None        Subjective: Today, patient was seen and examined at bedside.  States  that she feels a little better in the stomach with less pain and had some gas but no bowel movements.  No nausea or vomiting.  No shortness of breath or  dyspnea.   Objective: Vitals:   09/13/24 0401 09/13/24 0730  BP: 130/79 121/68  Pulse: 98 95  Resp: 16 16  Temp: 98.2 F (36.8 C) 98.2 F (36.8 C)  SpO2: 97% 92%    Intake/Output Summary (Last 24 hours) at 09/13/2024 0803 Last data filed at 09/13/2024 0416 Gross per 24 hour  Intake --  Output 1400 ml  Net -1400 ml   Filed Weights   09/10/24 1946 09/12/24 1227 09/13/24 0500  Weight: 57.2 kg 58.9 kg 60.1 kg   Body mass index is 25.88 kg/m.   Physical Exam:  GENERAL: Patient is alert awake and oriented. Not in obvious distress.  NG tube in place with HENT: No scleral pallor or icterus. Pupils equally reactive to light. Oral mucosa is moist NECK: is supple, no gross swelling noted. CHEST: Clear to auscultation. No crackles or wheezes.   CVS: S1 and S2 heard, no murmur. Regular rate and rhythm.  ABDOMEN: Slightly distended and tympanic on percussion with some bowel sounds.  Diffuse nonspecific tenderness on palpation. EXTREMITIES: No edema.  Right upper extremity PICC line in place CNS: Cranial nerves are intact. No focal motor deficits. SKIN: warm and dry without rashes.  Data Review: I have personally reviewed the following laboratory data and studies,  CBC: Recent Labs  Lab 09/10/24 2002 09/11/24 0302 09/12/24 0401 09/13/24 0411  WBC 12.3* 10.0 9.0 8.0  NEUTROABS 9.8*  --   --   --   HGB 13.5 11.7* 12.0 11.7*  HCT 41.0 35.5* 38.0 36.6  MCV 89.7 91.0 93.1 92.7  PLT PLATELET CLUMPS NOTED ON SMEAR, UNABLE TO ESTIMATE 159 160 177   Basic Metabolic Panel: Recent Labs  Lab 09/10/24 2002 09/11/24 0302 09/12/24 0401 09/13/24 0411  NA 135 135 141 142  K 3.2* 3.8 3.3* 3.3*  CL 95* 98 102 104  CO2 22 25 21* 28  GLUCOSE 106* 99 94 149*  BUN 19 17 15 13   CREATININE 0.94 0.77 0.94 0.70  CALCIUM  9.2 8.2* 8.6* 8.5*  MG  --  2.1 2.2 2.1  PHOS  --  3.2 4.1 2.1*   Liver Function Tests: Recent Labs  Lab 09/10/24 2002 09/11/24 0302  AST 18 16  ALT 12 11   ALKPHOS 58 48  BILITOT 1.4* 1.1  PROT 7.2 6.0*  ALBUMIN  3.8 3.1*   Recent Labs  Lab 09/10/24 2002  LIPASE 53*   No results for input(s): AMMONIA in the last 168 hours. Cardiac Enzymes: No results for input(s): CKTOTAL, CKMB, CKMBINDEX, TROPONINI in the last 168 hours. BNP (last 3 results) No results for input(s): BNP in the last 8760 hours.  ProBNP (last 3 results) No results for input(s): PROBNP in the last 8760 hours.  CBG: Recent Labs  Lab 09/12/24 1629 09/12/24 2058 09/12/24 2322 09/13/24 0406 09/13/24 0731  GLUCAP 103* 134* 132* 152* 144*   Recent Results (from the past 240 hours)  Urine Culture     Status: Abnormal   Collection Time: 09/06/24  3:06 AM   Specimen: Urine, Clean Catch  Result Value Ref Range Status   Specimen Description   Final    URINE, CLEAN CATCH Performed at [facility], [address] [place], [place] [address]    Special Requests   Final    NONE Performed at  [facility], [address] [place], [place] [address]    Culture (A)  Final    <10,000 COLONIES/mL INSIGNIFICANT GROWTH Performed at [facility], [address], [place], [place] [address]    Report Status 09/07/2024 FINAL  Final     Studies: DG Abd Portable 1V-Small Bowel Obstruction Protocol-initial, 8 hr delay Result Date: 09/12/2024 EXAM: 1 VIEW XRAY OF THE ABDOMEN 09/12/2024 09:25:00 PM COMPARISON: 09/12/2024 CLINICAL HISTORY: FINDINGS: LINES, TUBES AND DEVICES: Enteric tube stable in place, side hole at the level of the proximal stomach. Bilateral common iliac artery stents noted. BOWEL: Multiple loops of dilated small bowel were again identified throughout the abdomen in keeping with changes of the distal small bowel obstruction. Administered oral contrast is seen within several distal loops of small bowel, however, contrast has not yet advanced into the colon. SOFT TISSUES: No opaque urinary calculi. BONES: No  acute osseous abnormality. VASCULATURE: Aortoiliac calcified atherosclerosis. IMPRESSION: 1. Distal small bowel obstruction with oral contrast not yet advanced into the colon. Electronically signed by: Dorethia Molt MD 09/12/2024 10:48 PM EST RP Workstation: [id number]   US  EKG SITE RITE Result Date: 09/12/2024 If Site Rite image not attached, placement could not be confirmed due to current cardiac rhythm.  DG Abd Portable 1V-Small Bowel Obstruction Protocol-24 hr delay Result Date: 09/12/2024 EXAM: 1 VIEW XRAY OF THE ABDOMEN 09/12/2024 06:30:00 AM COMPARISON: 09/11/2024 CLINICAL HISTORY: SBO (small bowel obstruction) (HCC) FINDINGS: LINES, TUBES AND DEVICES: Enteric tube in place with distal tip and side port terminating within the expected location of the gastric fundus and body. Bilateral common iliac artery stents present. BOWEL: Multiple dilated small bowel loops unchanged. SOFT TISSUES: No opaque urinary calculi. BONES: No acute osseous abnormality. IMPRESSION: 1. Multiple dilated small bowel loops consistent with small bowel obstruction, unchanged from prior examination. Electronically signed by: Waddell Calk MD 09/12/2024 07:26 AM EST RP Workstation: [id number]   DG Abd Portable 1V-Small Bowel Obstruction Protocol-initial, 8 hr delay Result Date: 09/11/2024 CLINICAL DATA:  Small bowel obstruction. EXAM: PORTABLE ABDOMEN - 1 VIEW COMPARISON:  September 11, 2024 (1:10 a.m.) FINDINGS: There is stable nasogastric tube positioning with its distal tip seen within the body of the stomach. Numerous dilated loops of air-filled small bowel are seen throughout the abdomen. This is unchanged severity when compared to the prior study. No radio-opaque calculi or other significant radiographic abnormality are seen. IMPRESSION: 1. Stable nasogastric tube positioning. 2. Stable small bowel obstruction. Electronically Signed   By: Suzen Dials M.D.   On: 09/11/2024 19:35      Vernal Alstrom, MD  [facility] 09/13/2024  If 7PM-7AM, please contact night-coverage

## 2024-09-13 NOTE — Transfer of Care (Signed)
 Immediate Anesthesia Transfer of Care Note  Patient: Jasmine Acosta  Procedure(s) Performed: LAPAROTOMY, EXPLORATORY W/LYSIS OF ADHESIONS (Abdomen)  Patient Location: PACU  Anesthesia Type:General  Level of Consciousness: awake  Airway & Oxygen Therapy: Patient Spontanous Breathing and Patient connected to face mask oxygen  Post-op Assessment: Report given to RN and Post -op Vital signs reviewed and stable  Post vital signs: Reviewed and stable  Last Vitals:  Vitals Value Taken Time  BP 111/98 09/13/24 15:30  Temp    Pulse 93 09/13/24 15:33  Resp 16 09/13/24 15:33  SpO2 92 % 09/13/24 15:33  Vitals shown include unfiled device data.  Last Pain:  Vitals:   09/13/24 1025  TempSrc:   PainSc: 0-No pain         Complications: No notable events documented.

## 2024-09-13 NOTE — Plan of Care (Signed)
  Problem: Education: Goal: Knowledge of General Education information will improve Description: Including pain rating scale, medication(s)/side effects and non-pharmacologic comfort measures Outcome: Progressing   Problem: Health Behavior/Discharge Planning: Goal: Ability to manage health-related needs will improve Outcome: Progressing   Problem: Clinical Measurements: Goal: Ability to maintain clinical measurements within normal limits will improve Outcome: Progressing Goal: Will remain free from infection Outcome: Progressing Goal: Diagnostic test results will improve Outcome: Progressing Goal: Respiratory complications will improve Outcome: Progressing Goal: Cardiovascular complication will be avoided Outcome: Progressing   Problem: Activity: Goal: Risk for activity intolerance will decrease Outcome: Progressing   Problem: Nutrition: Goal: Adequate nutrition will be maintained Outcome: Progressing   Problem: Coping: Goal: Level of anxiety will decrease Outcome: Progressing   Problem: Elimination: Goal: Will not experience complications related to bowel motility Outcome: Progressing   Problem: Pain Managment: Goal: General experience of comfort will improve and/or be controlled Outcome: Progressing   Problem: Safety: Goal: Ability to remain free from injury will improve Outcome: Progressing

## 2024-09-13 NOTE — Progress Notes (Signed)
 PHARMACY - TOTAL PARENTERAL NUTRITION CONSULT NOTE   Indication: Small bowel obstruction  Patient Measurements: Height: 5' (152.4 cm) Weight: 60.1 kg (132 lb 7.9 oz) IBW/kg (Calculated) : 45.5 TPN AdjBW (KG): 48.4 Body mass index is 25.88 kg/m. Usual Weight: ~60 kg  Assessment:  74 yo F who presented with abdominal pain, nausea, and vomiting. She was recently admitted 11/11-11/13 w abdominal pain and poor PO intake. She has had continued poor PO intake. She was found to have a small bowel obstruction with transition in the pelvis. Pharmacy was consulted for TPN in setting of prolonged NPO status and small bowel obstruction. High refeeding risk. Currently conservative management but may need surgery.   Glucose / Insulin : 01/2024 A1c= 5.0 - CBG 94-152, 4u SSI  Electrolytes: K low 3.3 (goal >4), Phos low (4.1>2.1; goal >3), CoCa 9.2 - others WNL  Renal: scr 0.7, BUN 13 Hepatic: LFT WNL on 11/18, Alb 3.1  Intake / Output; MIVF: NGT output up 1400 ml, Net -1.9L this admit   GI Imaging:  11/20 KUB multiple dilated small bowel loops c/w SBO (unchanged from prior)  GI Surgeries / Procedures: none since TPN start  Central access: PICC 09/12/2024  TPN start date: 09/12/2024  Nutritional Goals: Goal TPN rate is 60 mL/hr (provides 69.9 g of protein and 1597 kcals per day)  RD Assessment: Estimated Needs Total Energy Estimated Needs: 1530-1760 Total Protein Estimated Needs: 68-88 g Total Fluid Estimated Needs: 1.5-1.7L/d  Current Nutrition:  NPO  Plan:  Continue TPN at 30 mL/hr at 1800 - to provide 50% of needs on D2 of TPN (due to drop in electrolytes in setting of high risk refeeding; plan to advance when electrolytes imprpove) Electrolytes in TPN: Na 50 mEq/L, K 70 mEq/L, Ca 5 mEq/L, Mg 5 mEq/L, and Phos 20 mmol/L. Cl:Ac 1:2 Add standard MVI and trace elements to TPN Initiate Sensitive q4h SSI tonight and adjust as needed  Reduce MIVF to 70 mL/hr at 1800 Potassium Phosphate  30  mmol IV x1 + KCl 10mEq IV x3 today.  Repeat labs in PM.  Monitor TPN labs on Mon/Thurs, daily until stable  Thiamine 100 mg x7 days in TPN (D1/7)  Harlene Boga, PharmD, BCPS, BCCCP Please refer to [facility] for [facility] Pharmacy numbers 09/13/2024 7:15 AM

## 2024-09-13 NOTE — Plan of Care (Signed)

## 2024-09-13 NOTE — Anesthesia Procedure Notes (Signed)
 Procedure Name: Intubation Date/Time: 09/13/2024 2:03 PM  Performed by: Delores Dus, CRNAPre-anesthesia Checklist: Patient identified, Emergency Drugs available, Suction available and Patient being monitored Patient Re-evaluated:Patient Re-evaluated prior to induction Oxygen Delivery Method: Circle system utilized Preoxygenation: Pre-oxygenation with 100% oxygen Induction Type: IV induction Ventilation: Mask ventilation without difficulty Laryngoscope Size: Miller and 2 Grade View: Grade I Tube type: Oral Tube size: 7.0 mm Number of attempts: 1 Airway Equipment and Method: Stylet and Oral airway Placement Confirmation: ETT inserted through vocal cords under direct vision, positive ETCO2 and breath sounds checked- equal and bilateral Secured at: 22 cm Tube secured with: Tape Dental Injury: Teeth and Oropharynx as per pre-operative assessment

## 2024-09-13 NOTE — Anesthesia Preprocedure Evaluation (Addendum)
 Anesthesia Evaluation  Patient identified by MRN, date of birth, ID band Patient awake    Reviewed: Allergy & Precautions, NPO status , Patient's Chart, lab work & pertinent test results  History of Anesthesia Complications Negative for: history of anesthetic complications  Airway Mallampati: II  TM Distance: >3 FB Neck ROM: Full    Dental no notable dental hx. (+) Edentulous Upper, Edentulous Lower   Pulmonary COPD, former smoker   Pulmonary exam normal breath sounds clear to auscultation       Cardiovascular hypertension, (-) angina + CAD  (-) Past MI Normal cardiovascular exam Rhythm:Regular Rate:Normal     Neuro/Psych  Headaches    GI/Hepatic ,,,(+)     substance abuse (hx)    Endo/Other    Renal/GU Lab Results      Component                Value               Date                      NA                       142                 09/13/2024                CL                       104                 09/13/2024                K                        3.3 (L)             09/13/2024                CO2                      28                  09/13/2024                BUN                      13                  09/13/2024                CREATININE               0.70                09/13/2024                GFRNONAA                 >60                 09/13/2024                CALCIUM                   8.5 (L)             09/13/2024  PHOS                     2.1 (L)             09/13/2024                ALBUMIN                   3.1 (L)             09/11/2024                GLUCOSE                  149 (H)             09/13/2024                Musculoskeletal   Abdominal   Peds  Hematology Lab Results      Component                Value               Date                      WBC                      8.0                 09/13/2024                HGB                      11.7 (L)             09/13/2024                HCT                      36.6                09/13/2024                MCV                      92.7                09/13/2024                PLT                      177                 09/13/2024              Anesthesia Other Findings All: lisinopril, codeine  Reproductive/Obstetrics                              Anesthesia Physical Anesthesia Plan  ASA: 3  Anesthesia Plan: General   Post-op Pain Management: Ketamine  IV* and Tylenol  PO (pre-op)*   Induction: Intravenous  PONV Risk Score and Plan: Treatment may vary due to age or medical condition, Midazolam , Dexamethasone  and Ondansetron   Airway Management Planned: Oral ETT  Additional Equipment: None  Intra-op Plan:   Post-operative Plan: Extubation in OR  Informed Consent: I have reviewed the patients History and Physical, chart, labs and  discussed the procedure including the risks, benefits and alternatives for the proposed anesthesia with the patient or authorized representative who has indicated his/her understanding and acceptance.     Dental advisory given  Plan Discussed with: CRNA and Surgeon  Anesthesia Plan Comments:         Anesthesia Quick Evaluation

## 2024-09-13 NOTE — Op Note (Signed)
 PRE-OPERATIVE DIAGNOSIS: small bowel obstruction  POST-OPERATIVE DIAGNOSIS:  Same  PROCEDURE:  Procedure(s): Exploratory laparotomy, lysis of adhesions  SURGEON:  Surgeon(s): Jina Nephew, MD  ASSIST:  Marjorie Favre, PA-C  ANESTHESIA:   general  DRAINS: none   LOCAL MEDICATIONS USED:  NONE  SPECIMEN:  Source of Specimen:  small bowel mesenteric nodule  DISPOSITION OF SPECIMEN:  PATHOLOGY  COUNTS:  YES  DICTATION: .Dragon Dictation  PLAN OF CARE: back to floor after PACU  PATIENT DISPOSITION:  PACU - hemodynamically stable.  FINDINGS:  single band adhesions deep in pelvis, small mesenteric nodule near adhesion  EBL: <50 mL  PROCEDURE:  Patient was identified in the holding area and taken to the operating room where she was placed supine on the operating room table.  General anesthesia was induced.  Foley catheter was placed.  The abdomen was then prepped and draped in sterile fashion.  A timeout was performed according to the surgical safety checklist.  When all was correct, we continued.  A vertical midline incision was made in the mid abdomen, taking care to be a little bit superior from her prior incision.  The subcutaneous tissues were divided with the cautery.  The fascia was entered in the midline with the cautery.  The peritoneum was elevated with 2 DeBakey clamps and peritoneum was entered with the cautery as well.  The viscera was protected while the fascial incision was carried out the length of the skin incision.  Small bowel immediately eviscerated.  There was no evidence of any ischemic bowel.  The bowel was run from proximal to distal.  In the mid to distal ileum, there was a loop of bowel that dove down into the pelvis.  It took a bit of manipulation to be able to expose this given the dilation of the proximal bowel.  Once this was identified, there was a single band that was able to be divided.  This was a very tight band, but the bowel underneath the band did  not appear to be damaged.  The bowel caliber increased rapidly after release of the adhesion.  Some of the fluid was able to be milked back proximally to the NG tube.  The bowel was then run again from proximal to distal and a small nodule was found on the small bowel mesentery.  This appeared to be likely fat necrosis, but it was a bit firm so it was taken off the mesentery and sent for pathology.  The abdomen was then irrigated.  The fascia was used to assist with closure.  The fascia was closed using running #1 looped PDS suture x 2.  The skin was irrigated and then reapproximated with skin staples.  The patient was allowed to emerge from anesthesia and taken to the PACU in stable condition.  Needle, sponge, and instrument counts were correct x 2.

## 2024-09-14 DIAGNOSIS — K56609 Unspecified intestinal obstruction, unspecified as to partial versus complete obstruction: Secondary | ICD-10-CM | POA: Diagnosis not present

## 2024-09-14 LAB — GLUCOSE, CAPILLARY
Glucose-Capillary: 120 mg/dL — ABNORMAL HIGH (ref 70–99)
Glucose-Capillary: 131 mg/dL — ABNORMAL HIGH (ref 70–99)
Glucose-Capillary: 136 mg/dL — ABNORMAL HIGH (ref 70–99)
Glucose-Capillary: 136 mg/dL — ABNORMAL HIGH (ref 70–99)
Glucose-Capillary: 138 mg/dL — ABNORMAL HIGH (ref 70–99)
Glucose-Capillary: 152 mg/dL — ABNORMAL HIGH (ref 70–99)
Glucose-Capillary: 159 mg/dL — ABNORMAL HIGH (ref 70–99)

## 2024-09-14 LAB — BASIC METABOLIC PANEL WITH GFR
Anion gap: 9 (ref 5–15)
BUN: 9 mg/dL (ref 8–23)
CO2: 26 mmol/L (ref 22–32)
Calcium: 7.8 mg/dL — ABNORMAL LOW (ref 8.9–10.3)
Chloride: 107 mmol/L (ref 98–111)
Creatinine, Ser: 0.67 mg/dL (ref 0.44–1.00)
GFR, Estimated: >60 mL/min (ref >60)
Glucose, Bld: 142 mg/dL — ABNORMAL HIGH (ref 70–99)
Potassium: 4.3 mmol/L (ref 3.5–5.1)
Sodium: 142 mmol/L (ref 135–145)

## 2024-09-14 LAB — PHOSPHORUS: Phosphorus: 4.4 mg/dL (ref 2.5–4.6)

## 2024-09-14 LAB — MAGNESIUM: Magnesium: 1.8 mg/dL (ref 1.7–2.4)

## 2024-09-14 MED ORDER — TRAVASOL 10 % IV SOLN
INTRAVENOUS | Status: AC
  Filled 2024-09-14: qty 524.9

## 2024-09-14 MED ORDER — HYDROMORPHONE HCL 1 MG/ML IJ SOLN
0.5000 mg | INTRAMUSCULAR | Status: DC | PRN
  Administered 2024-09-14 – 2024-09-16 (×10): 0.5 mg via INTRAVENOUS
  Filled 2024-09-14 (×10): qty 0.5

## 2024-09-14 MED ORDER — MAGNESIUM SULFATE 2 GM/50ML IV SOLN
2.0000 g | Freq: Once | INTRAVENOUS | Status: AC
  Administered 2024-09-14: 2 g via INTRAVENOUS
  Filled 2024-09-14: qty 50

## 2024-09-14 MED ORDER — SIMETHICONE 80 MG PO CHEW
80.0000 mg | CHEWABLE_TABLET | Freq: Four times a day (QID) | ORAL | Status: DC | PRN
  Administered 2024-09-14: 80 mg via ORAL
  Filled 2024-09-14: qty 1

## 2024-09-14 NOTE — Anesthesia Postprocedure Evaluation (Signed)
 Anesthesia Post Note  Patient: Jasmine Acosta  Procedure(s) Performed: LAPAROTOMY, EXPLORATORY W/LYSIS OF ADHESIONS (Abdomen)     Patient location during evaluation: PACU Anesthesia Type: General Level of consciousness: awake and alert Pain management: pain level controlled Vital Signs Assessment: post-procedure vital signs reviewed and stable Respiratory status: spontaneous breathing, nonlabored ventilation, respiratory function stable and patient connected to nasal cannula oxygen Cardiovascular status: blood pressure returned to baseline and stable Postop Assessment: no apparent nausea or vomiting Anesthetic complications: no   No notable events documented.  Last Vitals:  Vitals:   09/14/24 0415 09/14/24 0740  BP: 127/69 127/72  Pulse: 91 92  Resp: 18 16  Temp: 36.6 C 36.7 C  SpO2: 99% 100%    Last Pain:  Vitals:   09/14/24 1158  TempSrc:   PainSc: 7                  Garnette DELENA Gab

## 2024-09-14 NOTE — Progress Notes (Signed)
 PHARMACY - TOTAL PARENTERAL NUTRITION CONSULT NOTE   Indication: Small bowel obstruction  Patient Measurements: Height: 5' (152.4 cm) Weight: 60.1 kg (132 lb 7.9 oz) IBW/kg (Calculated) : 45.5 TPN AdjBW (KG): 49.1 Body mass index is 25.88 kg/m. Usual Weight: ~60 kg  Assessment:  73 yo F who presented with abdominal pain, nausea, and vomiting. She was recently admitted 11/11-11/13 w abdominal pain and poor PO intake. She has had continued poor PO intake. She was found to have a small bowel obstruction with transition in the pelvis. Pharmacy was consulted for TPN in setting of prolonged NPO status and small bowel obstruction. High refeeding risk.   Glucose / Insulin : 01/2024 A1c= 5.0 - CBG 120-159, 3u SSI  *Received Decadron  5mg  pre-surgery 11/21.  Electrolytes: K 4.3 (after goal >4), Phos 4.4(goal >3), Mg 1.8 (goal >2), CoCa 9.2 - others WNL  Renal: scr 0.7, BUN 13 Hepatic: LFT WNL on 11/18, Alb 3.1  Intake / Output; MIVF: NGT output up 1400 ml, Net -1.9L this admit   GI Imaging:  11/20 KUB multiple dilated small bowel loops c/w SBO (unchanged from prior)  GI Surgeries / Procedures:  11/21 Exlap, lysis of adhesions  Central access: PICC 09/12/2024  TPN start date: 09/12/2024  Nutritional Goals: Goal TPN rate is 60 mL/hr (provides 69.9 g of protein and 1597 kcals per day)  RD Assessment: Estimated Needs Total Energy Estimated Needs: 1530-1760 Total Protein Estimated Needs: 68-88 g Total Fluid Estimated Needs: 1.5-1.7L/d  Current Nutrition:  NPO  Plan:  Increase TPN at 45 mL/hr at 1800 - to provide 75% of needs on D3 of TPN (slow advancement due to drop in electrolytes in setting of high risk refeeding; plan to advance when electrolytes imprpove) Electrolytes in TPN: Na 50 mEq/L, K 70 mEq/L, Ca 5 mEq/L, Mg 5 mEq/L, and Phos 20 mmol/L. Cl:Ac 1:2 Add standard MVI and trace elements to TPN Initiate Sensitive q4h SSI tonight and adjust as needed  Reduce MIVF to 70 mL/hr at  1800 Magnesium  2g IV x1.  Monitor TPN labs on Mon/Thurs, daily until stable  Thiamine 100 mg x7 days in TPN (D3/7)  Harlene Boga, PharmD, BCPS, BCCCP Please refer to [facility] for [facility] Pharmacy numbers 09/14/2024 7:56 AM

## 2024-09-14 NOTE — Progress Notes (Signed)
 1 Day Post-Op   Subjective/Chief Complaint: Pt with incisional pain didn't sleep well   Objective: Vital signs in last 24 hours: Temp:  [97.6 F (36.4 C)-98.6 F (37 C)] 98 F (36.7 C) (11/22 0740) Pulse Rate:  [81-92] 92 (11/22 0740) Resp:  [10-20] 16 (11/22 0740) BP: (101-147)/(60-98) 127/72 (11/22 0740) SpO2:  [95 %-100 %] 100 % (11/22 0740) Last BM Date : 09/03/24  Intake/Output from previous day: 11/21 0701 - 11/22 0700 In: 1600 [I.V.:1250; IV Piggyback:350] Out: 1010 [Urine:210; Emesis/NG output:50; Blood:50] Intake/Output this shift: No intake/output data recorded.  Incision/Wound:C/D/I honeycomb dressing with minimal drainage   Lab Results:  Recent Labs    09/12/24 0401 09/13/24 0411  WBC 9.0 8.0  HGB 12.0 11.7*  HCT 38.0 36.6  PLT 160 177   BMET Recent Labs    09/13/24 1941 09/14/24 0040  NA 141 142  K 3.9 4.3  CL 106 107  CO2 27 26  GLUCOSE 163* 142*  BUN 9 9  CREATININE 0.70 0.67  CALCIUM  7.7* 7.8*   PT/INR No results for input(s): LABPROT, INR in the last 72 hours. ABG No results for input(s): PHART, HCO3 in the last 72 hours.  Invalid input(s): PCO2, PO2  Studies/Results: DG Abd Portable 1V Result Date: 09/13/2024 EXAM: 1 VIEW XRAY OF THE ABDOMEN 09/13/2024 06:30:00 AM COMPARISON: 09/13/2023 CLINICAL HISTORY: SBO (small bowel obstruction) (HCC) FINDINGS: LINES, TUBES AND DEVICES: Enteric tube terminates in the stomach. Bilateral common iliac artery stents are noted. BOWEL: Multiple dilated loops of small bowel, up to 4.4 cm, are again seen in the abdomen, overall similar to the prior study. Contrast material opacifies these small bowel loops without oral contrast material advancing into the colon. SOFT TISSUES: Vascular calcifications are noted. No opaque urinary calculi. BONES: No acute osseous abnormality. IMPRESSION: 1. Small bowel obstruction, overall similar to the prior study, with multiple dilated loops of small bowel up to  4.4 cm and no oral contrast material advancing into the colon. Electronically signed by: Waddell Calk MD 09/13/2024 08:47 AM EST RP Workstation: [id number]   DG Abd Portable 1V-Small Bowel Obstruction Protocol-initial, 8 hr delay Result Date: 09/12/2024 EXAM: 1 VIEW XRAY OF THE ABDOMEN 09/12/2024 09:25:00 PM COMPARISON: 09/12/2024 CLINICAL HISTORY: FINDINGS: LINES, TUBES AND DEVICES: Enteric tube stable in place, side hole at the level of the proximal stomach. Bilateral common iliac artery stents noted. BOWEL: Multiple loops of dilated small bowel were again identified throughout the abdomen in keeping with changes of the distal small bowel obstruction. Administered oral contrast is seen within several distal loops of small bowel, however, contrast has not yet advanced into the colon. SOFT TISSUES: No opaque urinary calculi. BONES: No acute osseous abnormality. VASCULATURE: Aortoiliac calcified atherosclerosis. IMPRESSION: 1. Distal small bowel obstruction with oral contrast not yet advanced into the colon. Electronically signed by: Dorethia Molt MD 09/12/2024 10:48 PM EST RP Workstation: [id number]   US  EKG SITE RITE Result Date: 09/12/2024 If Site Rite image not attached, placement could not be confirmed due to current cardiac rhythm.   Anti-infectives: Anti-infectives (From admission, onward)    Start     Dose/Rate Route Frequency Ordered Stop   09/13/24 0915  ceFAZolin  (ANCEF ) IVPB 2g/100 mL premix        2 g 200 mL/hr over 30 Minutes Intravenous On call to O.R. 09/13/24 0826 09/13/24 1441       Assessment/Plan: s/p Procedure(s): LAPAROTOMY, EXPLORATORY W/LYSIS OF ADHESIONS (N/A) OOB NPO DVT prevention Ice chips ok   LOS: 4  days    Debby DELENA Shipper MD  09/14/2024

## 2024-09-14 NOTE — Plan of Care (Signed)
  Problem: Health Behavior/Discharge Planning: Goal: Ability to manage health-related needs will improve Outcome: Progressing   Problem: Clinical Measurements: Goal: Will remain free from infection Outcome: Progressing   Problem: Activity: Goal: Risk for activity intolerance will decrease Outcome: Progressing   Problem: Nutrition: Goal: Adequate nutrition will be maintained Outcome: Progressing   Problem: Elimination: Goal: Will not experience complications related to bowel motility Outcome: Progressing   Problem: Elimination: Goal: Will not experience complications related to urinary retention Outcome: Progressing   Problem: Pain Managment: Goal: General experience of comfort will improve and/or be controlled Outcome: Progressing   Problem: Safety: Goal: Ability to remain free from injury will improve Outcome: Progressing   Problem: Skin Integrity: Goal: Risk for impaired skin integrity will decrease Outcome: Progressing

## 2024-09-14 NOTE — Plan of Care (Signed)

## 2024-09-14 NOTE — Progress Notes (Addendum)
 PROGRESS NOTE    Jasmine Acosta  FMW:[id number] DOB: [birth year] DOA: 09/10/2024 PCP: Lenon Layman ORN, MD   Brief Narrative:   73 y.o. female with past medical history significant for COPD, HTN, migraine, PVD, carotid artery stenosis s/p endarterectomy, PVD, hypokalemia substance use disorder presented hospital with intractable abdominal pain. CT scan of abdomen and pelvis showed severe small bowel obstruction with transition zone in the pelvis. Patient was given symptomatic care, general surgery was consulted  Status post exploratory laparotomy with lysis of adhesions done by surgery on 09/13/2024.  Assessment & Plan:  Principal Problem:   Small bowel obstruction (HCC) Active Problems:   Hypokalemia   Absolute anemia   Occlusion of carotid artery   Protein-calorie malnutrition, severe   Small bowel obstruction POA: Status post exploratory laparotomy with lysis of adhesions done on 09/13/2024 History of abdominal hysterectomy presented with abdominal pain, nausea and vomiting.   CT abdomen showed severe small bowel obstruction.  General surgery on board.   Continue n.p.o. except sips  continue antiemetics, analgesics (changed frequency of intravenous Dilaudid  0.5 mg from every 4 hours to every 3 hours on 09/14/2024) and supportive care.    NG-tube is in place Continue TPN with close monitoring of electrolytes.  PICC line in place.  TPN was started on 09/12/2024.Goal TPN rate is 60 mL/hr    Hypokalemia Replete as needed   Hypophosphatemia.  Replete as needed.   Essential hypertension. Continue with as needed intravenous hydralazine    COPD. Continue DuoNebs.  Appears stable.   History of peripheral vascular disease with intermittent claudication  Carotid artery stenosis s/p endarterectomy Status post stenting with CT showing patent stents in both common iliac arteries. Continue Plavix  when p.o. able.  Currently n.p.o.   Vitamin B12 deficiency On vitamin B12 as  outpatient.    Nutrition Status:Body mass index is 25.88 kg/m.  Nutrition Problem: Severe Malnutrition Etiology: acute illness Signs/Symptoms: energy intake < or equal to 50% for > or equal to 5 days, percent weight loss, moderate fat depletion, moderate muscle depletion Percent weight loss: 2.2 % Interventions: TPN, MVI Currently on TPN due to bowel obstruction and status post surgery.  Disposition: Home.  She may need home health services on discharge.  PT and OT will be consulted.  DVT prophylaxis: enoxaparin  (LOVENOX ) injection 40 mg Start: 09/10/24 2130     Code Status: Limited: Do not attempt resuscitation (DNR) -DNR-LIMITED -Do Not Intubate/DNI  Family Communication: None at the bedside Status is: Inpatient Remains inpatient appropriate because: Small bowel obstruction, status post exploratory    Subjective:  She is complaining of abdominal pain.  She said that she could not sleep last night because of the pain.  I spoke to her about adjusting her pain medications to achieve a better pain control.  Examination:  General exam: Appears to be in slight discomfort, NG tube in place Respiratory system: Clear to auscultation. Respiratory effort normal. Cardiovascular system: S1 & S2 heard, RRR. No JVD, murmurs, rubs, gallops or clicks. No pedal edema. Gastrointestinal system: Status post exploratory laparotomy with mesh in place, slight peri-incisional tenderness Central nervous system: Alert and oriented. No focal neurological deficits. Extremities: Symmetric 5 x 5 power. Skin: No rashes, lesions or ulcers Psychiatry: Judgement and insight appear normal. Mood & affect appropriate.      Diet Orders (From admission, onward)     Start     Ordered   09/11/24 1019  Diet NPO time specified Except for: Ice Chips  Diet effective now  Comments: To keep mouth moist only  Question:  Except for  Answer:  Ice Chips   09/11/24 1019            Objective: Vitals:    09/13/24 1713 09/13/24 2019 09/14/24 0415 09/14/24 0740  BP: 101/60 118/61 127/69 127/72  Pulse: 86 82 91 92  Resp: 18 16 18 16   Temp: 98.6 F (37 C) 97.6 F (36.4 C) 97.9 F (36.6 C) 98 F (36.7 C)  TempSrc:  Oral Oral Oral  SpO2: 97% 98% 99% 100%  Weight:      Height:        Intake/Output Summary (Last 24 hours) at 09/14/2024 0940 Last data filed at 09/13/2024 1651 Gross per 24 hour  Intake 1600 ml  Output 1010 ml  Net 590 ml   Filed Weights   09/12/24 1227 09/13/24 0500 09/13/24 1009  Weight: 58.9 kg 60.1 kg 60.1 kg    Scheduled Meds:  Chlorhexidine  Gluconate Cloth  6 each Topical Daily   enoxaparin  (LOVENOX ) injection  40 mg Subcutaneous Q24H   insulin  aspart  0-9 Units Subcutaneous Q4H   pantoprazole  (PROTONIX ) IV  40 mg Intravenous Q24H   Continuous Infusions:  TPN ADULT (ION) 30 mL/hr at 09/13/24 1819   TPN ADULT (ION)      Nutritional status Signs/Symptoms: energy intake < or equal to 50% for > or equal to 5 days, percent weight loss, moderate fat depletion, moderate muscle depletion Percent weight loss: 2.2 % Interventions: TPN, MVI Body mass index is 25.88 kg/m.  Data Reviewed:   CBC: Recent Labs  Lab 09/10/24 2002 09/11/24 0302 09/12/24 0401 09/13/24 0411  WBC 12.3* 10.0 9.0 8.0  NEUTROABS 9.8*  --   --   --   HGB 13.5 11.7* 12.0 11.7*  HCT 41.0 35.5* 38.0 36.6  MCV 89.7 91.0 93.1 92.7  PLT PLATELET CLUMPS NOTED ON SMEAR, UNABLE TO ESTIMATE 159 160 177   Basic Metabolic Panel: Recent Labs  Lab 09/11/24 0302 09/12/24 0401 09/13/24 0411 09/13/24 1941 09/14/24 0040  NA 135 141 142 141 142  K 3.8 3.3* 3.3* 3.9 4.3  CL 98 102 104 106 107  CO2 25 21* 28 27 26   GLUCOSE 99 94 149* 163* 142*  BUN 17 15 13 9 9   CREATININE 0.77 0.94 0.70 0.70 0.67  CALCIUM  8.2* 8.6* 8.5* 7.7* 7.8*  MG 2.1 2.2 2.1  --  1.8  PHOS 3.2 4.1 2.1* 3.5 4.4   GFR: Estimated Creatinine Clearance: 50 mL/min (by C-G formula based on SCr of 0.67 mg/dL). Liver  Function Tests: Recent Labs  Lab 09/10/24 2002 09/11/24 0302  AST 18 16  ALT 12 11  ALKPHOS 58 48  BILITOT 1.4* 1.1  PROT 7.2 6.0*  ALBUMIN  3.8 3.1*   Recent Labs  Lab 09/10/24 2002  LIPASE 53*   No results for input(s): AMMONIA in the last 168 hours. Coagulation Profile: No results for input(s): INR, PROTIME in the last 168 hours. Cardiac Enzymes: No results for input(s): CKTOTAL, CKMB, CKMBINDEX, TROPONINI in the last 168 hours. BNP (last 3 results) No results for input(s): PROBNP in the last 8760 hours. HbA1C: No results for input(s): HGBA1C in the last 72 hours. CBG: Recent Labs  Lab 09/13/24 1730 09/13/24 2020 09/14/24 0031 09/14/24 0417 09/14/24 0741  GLUCAP 127* 160* 138* 120* 159*   Lipid Profile: Recent Labs    09/13/24 0411  TRIG 60   Thyroid  Function Tests: No results for input(s): TSH, T4TOTAL, FREET4, T3FREE, THYROIDAB  in the last 72 hours. Anemia Panel: No results for input(s): VITAMINB12, FOLATE, FERRITIN, TIBC, IRON, RETICCTPCT in the last 72 hours. Sepsis Labs: No results for input(s): PROCALCITON, LATICACIDVEN in the last 168 hours.  Recent Results (from the past 240 hours)  Urine Culture     Status: Abnormal   Collection Time: 09/06/24  3:06 AM   Specimen: Urine, Clean Catch  Result Value Ref Range Status   Specimen Description   Final    URINE, CLEAN CATCH Performed at [facility], [address] [place], [place] [address]    Special Requests   Final    NONE Performed at [facility], [address], [place], [place] [address]    Culture (A)  Final    <10,000 COLONIES/mL INSIGNIFICANT GROWTH Performed at [facility], [address], [place], [place] [address]    Report Status 09/07/2024 FINAL  Final         Radiology Studies: DG Abd Portable 1V Result Date: 09/13/2024 EXAM: 1 VIEW XRAY OF THE ABDOMEN 09/13/2024 06:30:00 AM  COMPARISON: 09/13/2023 CLINICAL HISTORY: SBO (small bowel obstruction) (HCC) FINDINGS: LINES, TUBES AND DEVICES: Enteric tube terminates in the stomach. Bilateral common iliac artery stents are noted. BOWEL: Multiple dilated loops of small bowel, up to 4.4 cm, are again seen in the abdomen, overall similar to the prior study. Contrast material opacifies these small bowel loops without oral contrast material advancing into the colon. SOFT TISSUES: Vascular calcifications are noted. No opaque urinary calculi. BONES: No acute osseous abnormality. IMPRESSION: 1. Small bowel obstruction, overall similar to the prior study, with multiple dilated loops of small bowel up to 4.4 cm and no oral contrast material advancing into the colon. Electronically signed by: Waddell Calk MD 09/13/2024 08:47 AM EST RP Workstation: [id number]   DG Abd Portable 1V-Small Bowel Obstruction Protocol-initial, 8 hr delay Result Date: 09/12/2024 EXAM: 1 VIEW XRAY OF THE ABDOMEN 09/12/2024 09:25:00 PM COMPARISON: 09/12/2024 CLINICAL HISTORY: FINDINGS: LINES, TUBES AND DEVICES: Enteric tube stable in place, side hole at the level of the proximal stomach. Bilateral common iliac artery stents noted. BOWEL: Multiple loops of dilated small bowel were again identified throughout the abdomen in keeping with changes of the distal small bowel obstruction. Administered oral contrast is seen within several distal loops of small bowel, however, contrast has not yet advanced into the colon. SOFT TISSUES: No opaque urinary calculi. BONES: No acute osseous abnormality. VASCULATURE: Aortoiliac calcified atherosclerosis. IMPRESSION: 1. Distal small bowel obstruction with oral contrast not yet advanced into the colon. Electronically signed by: Dorethia Molt MD 09/12/2024 10:48 PM EST RP Workstation: [id number]   US  EKG SITE RITE Result Date: 09/12/2024 If Site Rite image not attached, placement could not be confirmed due to current cardiac  rhythm.          LOS: 4 days   Time spent= 35 mins    Deliliah Room, MD [facility]  If 7PM-7AM, please contact night-coverage  09/14/2024, 9:40 AM

## 2024-09-15 ENCOUNTER — Inpatient Hospital Stay (HOSPITAL_COMMUNITY)

## 2024-09-15 ENCOUNTER — Encounter (HOSPITAL_COMMUNITY): Payer: Self-pay | Admitting: General Surgery

## 2024-09-15 DIAGNOSIS — Z4682 Encounter for fitting and adjustment of non-vascular catheter: Secondary | ICD-10-CM | POA: Diagnosis not present

## 2024-09-15 DIAGNOSIS — K56609 Unspecified intestinal obstruction, unspecified as to partial versus complete obstruction: Secondary | ICD-10-CM | POA: Diagnosis not present

## 2024-09-15 DIAGNOSIS — R109 Unspecified abdominal pain: Secondary | ICD-10-CM | POA: Diagnosis not present

## 2024-09-15 DIAGNOSIS — J9811 Atelectasis: Secondary | ICD-10-CM | POA: Diagnosis not present

## 2024-09-15 LAB — BASIC METABOLIC PANEL WITH GFR
Anion gap: 5 (ref 5–15)
BUN: 9 mg/dL (ref 8–23)
CO2: 32 mmol/L (ref 22–32)
Calcium: 8.2 mg/dL — ABNORMAL LOW (ref 8.9–10.3)
Chloride: 104 mmol/L (ref 98–111)
Creatinine, Ser: 0.55 mg/dL (ref 0.44–1.00)
GFR, Estimated: >60 mL/min (ref >60)
Glucose, Bld: 130 mg/dL — ABNORMAL HIGH (ref 70–99)
Potassium: 3.8 mmol/L (ref 3.5–5.1)
Sodium: 141 mmol/L (ref 135–145)

## 2024-09-15 LAB — GLUCOSE, CAPILLARY
Glucose-Capillary: 123 mg/dL — ABNORMAL HIGH (ref 70–99)
Glucose-Capillary: 127 mg/dL — ABNORMAL HIGH (ref 70–99)
Glucose-Capillary: 133 mg/dL — ABNORMAL HIGH (ref 70–99)
Glucose-Capillary: 141 mg/dL — ABNORMAL HIGH (ref 70–99)
Glucose-Capillary: 144 mg/dL — ABNORMAL HIGH (ref 70–99)

## 2024-09-15 LAB — MAGNESIUM: Magnesium: 2.3 mg/dL (ref 1.7–2.4)

## 2024-09-15 LAB — PHOSPHORUS: Phosphorus: 3.3 mg/dL (ref 2.5–4.6)

## 2024-09-15 MED ORDER — TRAVASOL 10 % IV SOLN
INTRAVENOUS | Status: AC
  Filled 2024-09-15: qty 699.8

## 2024-09-15 MED ORDER — POTASSIUM CHLORIDE 10 MEQ/50ML IV SOLN
10.0000 meq | INTRAVENOUS | Status: AC
Start: 2024-09-15 — End: 2024-09-15
  Administered 2024-09-15 (×2): 10 meq via INTRAVENOUS
  Filled 2024-09-15 (×2): qty 50

## 2024-09-15 NOTE — Progress Notes (Signed)
 2 Days Post-Op   Subjective/Chief Complaint: Patient feels well.  She is having flatus.  No nausea vomiting.  She has good pain control.   Objective: Vital signs in last 24 hours: Temp:  [98.4 F (36.9 C)-99.7 F (37.6 C)] 98.4 F (36.9 C) (11/23 0835) Pulse Rate:  [94-96] 95 (11/23 0835) Resp:  [16] 16 (11/23 0407) BP: (130-139)/(70-90) 139/90 (11/23 0835) SpO2:  [96 %-100 %] 96 % (11/23 0835) Weight:  [60.4 kg] 60.4 kg (11/23 0442) Last BM Date : 09/03/24  Intake/Output from previous day: 11/22 0701 - 11/23 0700 In: 592.8 [I.V.:592.8] Out: 300 [Urine:300] Intake/Output this shift: No intake/output data recorded.  Abdomen: Less distended.  Soft.  Tender around incision.  Incision clean dry intact with honeycomb dressing in place.  Lab Results:  Recent Labs    09/13/24 0411  WBC 8.0  HGB 11.7*  HCT 36.6  PLT 177   BMET Recent Labs    09/14/24 0040 09/15/24 0236  NA 142 141  K 4.3 3.8  CL 107 104  CO2 26 32  GLUCOSE 142* 130*  BUN 9 9  CREATININE 0.67 0.55  CALCIUM  7.8* 8.2*   PT/INR No results for input(s): LABPROT, INR in the last 72 hours. ABG No results for input(s): PHART, HCO3 in the last 72 hours.  Invalid input(s): PCO2, PO2  Studies/Results: No results found.  Anti-infectives: Anti-infectives (From admission, onward)    Start     Dose/Rate Route Frequency Ordered Stop   09/13/24 0915  ceFAZolin  (ANCEF ) IVPB 2g/100 mL premix        2 g 200 mL/hr over 30 Minutes Intravenous On call to O.R. 09/13/24 0826 09/13/24 1441       Assessment/Plan: s/p Procedure(s): LAPAROTOMY, EXPLORATORY W/LYSIS OF ADHESIONS (N/A) DC NG tube  DC Foley  Out of bed  PT OT evaluation  Start clear liquids    LOS: 5 days    Debby DELENA Shipper MD 09/15/2024

## 2024-09-15 NOTE — Progress Notes (Signed)
 PHARMACY - TOTAL PARENTERAL NUTRITION CONSULT NOTE   Indication: Small bowel obstruction  Patient Measurements: Height: 5' (152.4 cm) Weight: 60.4 kg (133 lb 2.5 oz) IBW/kg (Calculated) : 45.5 TPN AdjBW (KG): 49.1 Body mass index is 26.01 kg/m. Usual Weight: ~60 kg  Assessment:  73 yo F who presented with abdominal pain, nausea, and vomiting. She was recently admitted 11/11-11/13 w abdominal pain and poor PO intake. She has had continued poor PO intake. She was found to have a small bowel obstruction with transition in the pelvis. Pharmacy was consulted for TPN in setting of prolonged NPO status and small bowel obstruction. High refeeding risk.   Glucose / Insulin : 01/2024 A1c= 5.0 - CBG 130-150s. Used 8 units of SSI in last 24 hours. *Received Decadron  5mg  pre-surgery 11/21.  Electrolytes: K 3.8 (after goal >4), Phos 3.3 (goal >3), Mg 2.3 (goal >2), CoCa 8.9 - others WNL  Renal: scr 0.7, BUN 13 Hepatic: LFT WNL on 11/18, Alb 3.1  Intake / Output; MIVF: NGT output 50 ml   GI Imaging:  11/20 KUB multiple dilated small bowel loops c/w SBO (unchanged from prior)  GI Surgeries / Procedures:  11/21 Exlap, lysis of adhesions  Central access: PICC 09/12/2024  TPN start date: 09/12/2024  Nutritional Goals: Goal TPN rate is 60 mL/hr (provides 69.9 g of protein and 1597 kcals per day)  RD Assessment: Estimated Needs Total Energy Estimated Needs: 1530-1760 Total Protein Estimated Needs: 68-88 g Total Fluid Estimated Needs: 1.5-1.7L/d  Current Nutrition:  NPO  Plan:  Increase TPN to goal of 60 mL/hr at 1800 - to provide 100% of needs Electrolytes in TPN: Na 50 mEq/L, K 70 mEq/L, Ca 5 mEq/L, Mg 5 mEq/L, and Phos 20 mmol/L. Cl:Ac 1:2 Add standard MVI and trace elements to TPN Continue Sensitive q4h SSI tonight and adjust as needed  KCl 10 mEq IV x2 today Monitor TPN labs on Mon/Thurs, daily until stable  Thiamine 100 mg x7 days in TPN (D4/7)  Harlene Boga, PharmD, BCPS,  BCCCP Please refer to [facility] for [facility] Pharmacy numbers 09/15/2024 8:02 AM

## 2024-09-15 NOTE — Plan of Care (Signed)

## 2024-09-15 NOTE — Progress Notes (Addendum)
 PROGRESS NOTE    SINAI ILLINGWORTH  FMW:[id number] DOB: Oct 09, 1951 DOA: 09/10/2024 PCP: Lenon Layman ORN, MD   Brief Narrative:   73 y.o. female with past medical history significant for COPD, HTN, migraine, PVD, carotid artery stenosis s/p endarterectomy, PVD, hypokalemia substance use disorder presented hospital with intractable abdominal pain. CT scan of abdomen and pelvis showed severe small bowel obstruction with transition zone in the pelvis. Patient was given symptomatic care, general surgery was consulted  Status post exploratory laparotomy with lysis of adhesions done by surgery on 09/13/2024. Slowly recovering Jasmine Acosta is on TPN and NG tube is in place.  Assessment & Plan:  Principal Problem:   Small bowel obstruction (HCC) Active Problems:   Hypokalemia   Absolute anemia   Occlusion of carotid artery   Protein-calorie malnutrition, severe   Small bowel obstruction POA: Status post exploratory laparotomy with lysis of adhesions done on 09/13/2024 Jasmine Acosta is passing flatus now.  No bowel movements yet. History of abdominal hysterectomy presented with abdominal pain, nausea and vomiting.   CT abdomen showed severe small bowel obstruction.  General surgery on board.   Will start clear liquids today. continue antiemetics, analgesics (changed frequency of intravenous Dilaudid  0.5 mg from every 4 hours to every 3 hours on 09/14/2024) and supportive care.    NG-tube is in place Continue TPN with close monitoring of electrolytes.  PICC line in place.  TPN was started on 09/12/2024.Goal TPN rate is 60 mL/hr Dc foley and NGT today    Hypokalemia Replete as needed   Hypophosphatemia.  Replete as needed.   Essential hypertension. Continue with as needed intravenous hydralazine    COPD. Continue DuoNebs.  Appears stable.   History of peripheral vascular disease with intermittent claudication  Carotid artery stenosis s/p endarterectomy Status post stenting with CT showing patent  stents in both common iliac arteries. Continue Plavix  when p.o. able.   Vitamin B12 deficiency On vitamin B12 as outpatient.    Nutrition Status:Body mass index is 25.88 kg/m.  Nutrition Problem: Severe Malnutrition Etiology: acute illness Signs/Symptoms: energy intake < or equal to 50% for > or equal to 5 days, percent weight loss, moderate fat depletion, moderate muscle depletion Percent weight loss: 2.2 % Interventions: TPN, MVI Currently on TPN due to bowel obstruction and status post surgery.  Disposition: Home.  Jasmine Acosta may need home health services on discharge.  PT and OT consulted.  DVT prophylaxis: enoxaparin  (LOVENOX ) injection 40 mg Start: 09/10/24 2130     Code Status: Limited: Do not attempt resuscitation (DNR) -DNR-LIMITED -Do Not Intubate/DNI  Family Communication: None at the bedside Status is: Inpatient Remains inpatient appropriate because: Small bowel obstruction, status post exploratory laparatomy    Subjective:  Jasmine Acosta feels much better this morning.  Her abdominal pain is significantly improved.  Jasmine Acosta is currently on TPN.  Jasmine Acosta has an NG tube in place.  Jasmine Acosta was wondering when the NG tube can be taken out.  Jasmine Acosta also wants to sit in the chair today. Jasmine Acosta is passing flatus but no bowel movements yet.  Examination:  General exam: Appears calm, NG tube in place Respiratory system: Clear to auscultation. Respiratory effort normal. Cardiovascular system: S1 & S2 heard, RRR. No JVD, murmurs, rubs, gallops or clicks. No pedal edema. Gastrointestinal system: Status post exploratory laparotomy with mesh in place, slight peri-incisional tenderness, diminished bowel sounds Central nervous system: Alert and oriented. No focal neurological deficits. Extremities: Symmetric 5 x 5 power. Skin: No rashes, lesions or ulcers Psychiatry: Judgement and insight  appear normal. Mood & affect appropriate.      Diet Orders (From admission, onward)     Start     Ordered   09/11/24  1019  Diet NPO time specified Except for: Ice Chips  Diet effective now       Comments: To keep mouth moist only  Question:  Except for  Answer:  Ice Chips   09/11/24 1019            Objective: Vitals:   09/14/24 1615 09/15/24 0407 09/15/24 0442 09/15/24 0835  BP: 130/71 137/70  (!) 139/90  Pulse: 94 96  95  Resp: 16 16    Temp: 99.7 F (37.6 C)   98.4 F (36.9 C)  TempSrc: Oral   Oral  SpO2: 98% 100%  96%  Weight:   60.4 kg   Height:        Intake/Output Summary (Last 24 hours) at 09/15/2024 0846 Last data filed at 09/15/2024 0654 Gross per 24 hour  Intake 592.78 ml  Output 300 ml  Net 292.78 ml   Filed Weights   09/13/24 0500 09/13/24 1009 09/15/24 0442  Weight: 60.1 kg 60.1 kg 60.4 kg    Scheduled Meds:  Chlorhexidine  Gluconate Cloth  6 each Topical Daily   enoxaparin  (LOVENOX ) injection  40 mg Subcutaneous Q24H   insulin  aspart  0-9 Units Subcutaneous Q4H   pantoprazole  (PROTONIX ) IV  40 mg Intravenous Q24H   Continuous Infusions:  potassium chloride      TPN ADULT (ION) 45 mL/hr at 09/15/24 0654   TPN ADULT (ION)      Nutritional status Signs/Symptoms: energy intake < or equal to 50% for > or equal to 5 days, percent weight loss, moderate fat depletion, moderate muscle depletion Percent weight loss: 2.2 % Interventions: TPN, MVI Body mass index is 26.01 kg/m.  Data Reviewed:   CBC: Recent Labs  Lab 09/10/24 2002 09/11/24 0302 09/12/24 0401 09/13/24 0411  WBC 12.3* 10.0 9.0 8.0  NEUTROABS 9.8*  --   --   --   HGB 13.5 11.7* 12.0 11.7*  HCT 41.0 35.5* 38.0 36.6  MCV 89.7 91.0 93.1 92.7  PLT PLATELET CLUMPS NOTED ON SMEAR, UNABLE TO ESTIMATE 159 160 177   Basic Metabolic Panel: Recent Labs  Lab 09/11/24 0302 09/12/24 0401 09/13/24 0411 09/13/24 1941 09/14/24 0040 09/15/24 0236  NA 135 141 142 141 142 141  K 3.8 3.3* 3.3* 3.9 4.3 3.8  CL 98 102 104 106 107 104  CO2 25 21* 28 27 26  32  GLUCOSE 99 94 149* 163* 142* 130*  BUN 17 15  13 9 9 9   CREATININE 0.77 0.94 0.70 0.70 0.67 0.55  CALCIUM  8.2* 8.6* 8.5* 7.7* 7.8* 8.2*  MG 2.1 2.2 2.1  --  1.8 2.3  PHOS 3.2 4.1 2.1* 3.5 4.4 3.3   GFR: Estimated Creatinine Clearance: 50.2 mL/min (by C-G formula based on SCr of 0.55 mg/dL). Liver Function Tests: Recent Labs  Lab 09/10/24 2002 09/11/24 0302  AST 18 16  ALT 12 11  ALKPHOS 58 48  BILITOT 1.4* 1.1  PROT 7.2 6.0*  ALBUMIN  3.8 3.1*   Recent Labs  Lab 09/10/24 2002  LIPASE 53*   No results for input(s): AMMONIA in the last 168 hours. Coagulation Profile: No results for input(s): INR, PROTIME in the last 168 hours. Cardiac Enzymes: No results for input(s): CKTOTAL, CKMB, CKMBINDEX, TROPONINI in the last 168 hours. BNP (last 3 results) No results for input(s): PROBNP in the last  8760 hours. HbA1C: No results for input(s): HGBA1C in the last 72 hours. CBG: Recent Labs  Lab 09/14/24 1134 09/14/24 1616 09/14/24 2009 09/14/24 2325 09/15/24 0836  GLUCAP 131* 136* 136* 152* 133*   Lipid Profile: Recent Labs    09/13/24 0411  TRIG 60   Thyroid  Function Tests: No results for input(s): TSH, T4TOTAL, FREET4, T3FREE, THYROIDAB in the last 72 hours. Anemia Panel: No results for input(s): VITAMINB12, FOLATE, FERRITIN, TIBC, IRON, RETICCTPCT in the last 72 hours. Sepsis Labs: No results for input(s): PROCALCITON, LATICACIDVEN in the last 168 hours.  Recent Results (from the past 240 hours)  Urine Culture     Status: Abnormal   Collection Time: 09/06/24  3:06 AM   Specimen: Urine, Clean Catch  Result Value Ref Range Status   Specimen Description   Final    URINE, CLEAN CATCH Performed at [facility], [address] [place], [place] [address]    Special Requests   Final    NONE Performed at [facility], [address] [place], [place] [address]    Culture (A)  Final    <10,000 COLONIES/mL INSIGNIFICANT  GROWTH Performed at [facility], [address] [place], [place] [address]    Report Status 09/07/2024 FINAL  Final         Radiology Studies: No results found.          LOS: 5 days   Time spent= 40 mins    Deliliah Room, MD [facility]  If 7PM-7AM, please contact night-coverage  09/15/2024, 8:46 AM

## 2024-09-15 NOTE — Plan of Care (Signed)
  Problem: Health Behavior/Discharge Planning: Goal: Ability to manage health-related needs will improve Outcome: Progressing   Problem: Clinical Measurements: Goal: Will remain free from infection Outcome: Progressing   Problem: Clinical Measurements: Goal: Diagnostic test results will improve Outcome: Progressing   Problem: Activity: Goal: Risk for activity intolerance will decrease Outcome: Progressing   Problem: Nutrition: Goal: Adequate nutrition will be maintained Outcome: Progressing   Problem: Elimination: Goal: Will not experience complications related to bowel motility Outcome: Progressing   Problem: Elimination: Goal: Will not experience complications related to urinary retention Outcome: Progressing   Problem: Pain Managment: Goal: General experience of comfort will improve and/or be controlled Outcome: Progressing   Problem: Safety: Goal: Ability to remain free from injury will improve Outcome: Progressing   Problem: Skin Integrity: Goal: Risk for impaired skin integrity will decrease Outcome: Progressing

## 2024-09-15 NOTE — Progress Notes (Signed)
 Patient has foley removed, due to void. Will monitor. Patient bed and linen changed. Family member giving CHG bath and cleaning her hair. Patient was a two person up to chair. Abdomen is swollen, dressing is in place and intact. Potassium is infusing and being tolerated.

## 2024-09-15 NOTE — Progress Notes (Signed)
 Patient abdomen is more distended than in AM, doctor notified. Patient has bowel sounds, NG tube no output during night., none this morning.  Her NG tube was clamped, patient got up to chair, NG tube was flushed without problem and reconnected to low intermittent suction. Patient stated she is having gas. No bowel movement since admission. Dilaudid  is being given Q3. Pain continues to be severe. Doctor notified.

## 2024-09-16 DIAGNOSIS — K56609 Unspecified intestinal obstruction, unspecified as to partial versus complete obstruction: Secondary | ICD-10-CM | POA: Diagnosis not present

## 2024-09-16 LAB — COMPREHENSIVE METABOLIC PANEL WITH GFR
ALT: 11 U/L (ref 0–44)
AST: 17 U/L (ref 15–41)
Albumin: 2.5 g/dL — ABNORMAL LOW (ref 3.5–5.0)
Alkaline Phosphatase: 41 U/L (ref 38–126)
Anion gap: 6 (ref 5–15)
BUN: 12 mg/dL (ref 8–23)
CO2: 29 mmol/L (ref 22–32)
Calcium: 8.3 mg/dL — ABNORMAL LOW (ref 8.9–10.3)
Chloride: 102 mmol/L (ref 98–111)
Creatinine, Ser: 0.6 mg/dL (ref 0.44–1.00)
GFR, Estimated: >60 mL/min (ref >60)
Glucose, Bld: 109 mg/dL — ABNORMAL HIGH (ref 70–99)
Potassium: 4 mmol/L (ref 3.5–5.1)
Sodium: 137 mmol/L (ref 135–145)
Total Bilirubin: 0.5 mg/dL (ref 0.0–1.2)
Total Protein: 5.7 g/dL — ABNORMAL LOW (ref 6.5–8.1)

## 2024-09-16 LAB — CBC
HCT: 36.2 % (ref 36.0–46.0)
Hemoglobin: 11.2 g/dL — ABNORMAL LOW (ref 12.0–15.0)
MCH: 29.3 pg (ref 26.0–34.0)
MCHC: 30.9 g/dL (ref 30.0–36.0)
MCV: 94.8 fL (ref 80.0–100.0)
Platelets: 226 K/uL (ref 150–400)
RBC: 3.82 MIL/uL — ABNORMAL LOW (ref 3.87–5.11)
RDW: 12.8 % (ref 11.5–15.5)
WBC: 14.9 K/uL — ABNORMAL HIGH (ref 4.0–10.5)
nRBC: 0 % (ref 0.0–0.2)

## 2024-09-16 LAB — TRIGLYCERIDES: Triglycerides: 78 mg/dL (ref <150)

## 2024-09-16 LAB — MAGNESIUM: Magnesium: 2.1 mg/dL (ref 1.7–2.4)

## 2024-09-16 LAB — PHOSPHORUS: Phosphorus: 3.6 mg/dL (ref 2.5–4.6)

## 2024-09-16 LAB — GLUCOSE, CAPILLARY
Glucose-Capillary: 143 mg/dL — ABNORMAL HIGH (ref 70–99)
Glucose-Capillary: 139 mg/dL — ABNORMAL HIGH (ref 70–99)
Glucose-Capillary: 147 mg/dL — ABNORMAL HIGH (ref 70–99)
Glucose-Capillary: 139 mg/dL — ABNORMAL HIGH (ref 70–99)
Glucose-Capillary: 122 mg/dL — ABNORMAL HIGH (ref 70–99)

## 2024-09-16 LAB — SURGICAL PATHOLOGY

## 2024-09-16 MED ORDER — GUAIFENESIN ER 600 MG PO TB12
600.0000 mg | ORAL_TABLET | Freq: Two times a day (BID) | ORAL | Status: DC
  Administered 2024-09-16 – 2024-09-20 (×9): 600 mg via ORAL
  Filled 2024-09-16 (×9): qty 1

## 2024-09-16 MED ORDER — LIDOCAINE 5 % EX PTCH
2.0000 | MEDICATED_PATCH | Freq: Every day | CUTANEOUS | Status: DC
  Administered 2024-09-16: 1 via TRANSDERMAL
  Administered 2024-09-17: 2 via TRANSDERMAL
  Filled 2024-09-16 (×5): qty 2

## 2024-09-16 MED ORDER — IBUPROFEN 200 MG PO TABS
400.0000 mg | ORAL_TABLET | Freq: Three times a day (TID) | ORAL | Status: DC
  Administered 2024-09-16 – 2024-09-19 (×8): 400 mg via ORAL
  Filled 2024-09-16 (×8): qty 2

## 2024-09-16 MED ORDER — BOOST / RESOURCE BREEZE PO LIQD CUSTOM
1.0000 | Freq: Three times a day (TID) | ORAL | Status: DC
  Administered 2024-09-16 – 2024-09-17 (×2): 1 via ORAL
  Filled 2024-09-16: qty 1

## 2024-09-16 MED ORDER — ACETAMINOPHEN 500 MG PO TABS
1000.0000 mg | ORAL_TABLET | Freq: Three times a day (TID) | ORAL | Status: DC
  Administered 2024-09-16 – 2024-09-19 (×8): 1000 mg via ORAL
  Filled 2024-09-16 (×11): qty 2

## 2024-09-16 MED ORDER — TRAMADOL HCL 50 MG PO TABS
50.0000 mg | ORAL_TABLET | Freq: Four times a day (QID) | ORAL | Status: DC | PRN
  Administered 2024-09-16 – 2024-09-17 (×2): 50 mg via ORAL
  Filled 2024-09-16 (×2): qty 1

## 2024-09-16 MED ORDER — TRAVASOL 10 % IV SOLN
INTRAVENOUS | Status: AC
  Filled 2024-09-16: qty 699.8

## 2024-09-16 NOTE — Progress Notes (Signed)
 Patient disconnected from suction for ambulation with OT. OT will notify RN when ready for patient to be reconnected. Sherline Jubilee

## 2024-09-16 NOTE — Progress Notes (Addendum)
 PHARMACY - TOTAL PARENTERAL NUTRITION CONSULT NOTE   Indication: Small bowel obstruction  Patient Measurements: Height: 5' (152.4 cm) Weight: 60.5 kg (133 lb 6.1 oz) IBW/kg (Calculated) : 45.5 TPN AdjBW (KG): 49.1 Body mass index is 26.05 kg/m. Usual Weight: ~60 kg  Assessment:  73 yo F who presented with abdominal pain, nausea, and vomiting. She was recently admitted 11/11-11/13 w abdominal pain and poor PO intake. She has had continued poor PO intake. She was found to have a small bowel obstruction with transition in the pelvis. Pharmacy was consulted for TPN in setting of prolonged NPO status and small bowel obstruction. High refeeding risk.   Glucose / Insulin : 01/2024 A1c= 5.0 - CBG <180. Used 5 units of SSI in last 24 hours. Electrolytes: K 4 (after goal >4), Phos 3.6 (goal >3), Mg 2.1 (goal >2), CoCa 9.6, others within normal limits Renal: scr 0.6 (stable), BUN 12 Hepatic: LFT/Tbili/TG within normal limits, Alb 3.1  Intake / Output; MIVF: NGT output 50 ml   GI Imaging:  11/20 KUB multiple dilated small bowel loops c/w SBO (unchanged from prior)  GI Surgeries / Procedures:  11/21 Exlap, lysis of adhesions  Central access: PICC 09/12/2024  TPN start date: 09/12/2024  Nutritional Goals: Goal TPN rate is 60 mL/hr (provides 69.9 g of protein and 1597 kcals per day)  RD Assessment: Estimated Needs Total Energy Estimated Needs: 1530-1760 Total Protein Estimated Needs: 68-88 g Total Fluid Estimated Needs: 1.5-1.7L/d  Current Nutrition:  Clear liquids  Plan:  Continue TPN at goal of 60 mL/hr at 1800 - to provide 100% of needs Electrolytes in TPN: Na 50 mEq/L, K 70 mEq/L, Ca 5 mEq/L, Mg 5 mEq/L, and Phos 20 mmol/L. Cl:Ac 1:2 Add standard MVI and trace elements to TPN Continue Sensitive q4h SSI tonight and adjust as needed  Monitor TPN labs on Mon/Thurs, daily until stable  Thiamine 100 mg x7 days in TPN (D5/7) Monitor diet advancement/toleration and ability to  discontinue TPN.   Harlene Boga, PharmD, BCPS, BCCCP Please refer to [facility] for [facility] numbers 09/16/2024 8:40 AM

## 2024-09-16 NOTE — Progress Notes (Signed)
 3 Days Post-Op   Subjective/Chief Complaint: Overall doing well. +flatus. No BM yet. Tolerating sips of clears around NGT without nausea/vomiting. Her cc is discomfort from NGT. Her great niece, who takes care of her at home, is at the bedside.    Objective: Vital signs in last 24 hours: Temp:  [97.7 F (36.5 C)-98.4 F (36.9 C)] 97.7 F (36.5 C) (11/24 0810) Pulse Rate:  [104-109] 104 (11/24 0810) Resp:  [17-19] 19 (11/24 0810) BP: (117-130)/(66-78) 120/78 (11/24 0810) SpO2:  [91 %-95 %] 94 % (11/24 0810) Weight:  [60.5 kg] 60.5 kg (11/24 0410) Last BM Date : 09/03/24  Intake/Output from previous day: 11/23 0701 - 11/24 0700 In: 691.7 [I.V.:691.7] Out: -  Intake/Output this shift: No intake/output data recorded. Gen: alert, NAD, appears uncomfortable up in the chair  Pulm: normal effort on room air, poor air movement, wet cough Abdomen: mild distention.  Soft.  Tender around incision.  Incision clean dry intact with honeycomb dressing in place. NG removed at bedside by me without complication  Lab Results:  Recent Labs    09/16/24 1213  WBC 14.9*  HGB 11.2*  HCT 36.2  PLT 226   BMET Recent Labs    09/15/24 0236 09/16/24 0429  NA 141 137  K 3.8 4.0  CL 104 102  CO2 32 29  GLUCOSE 130* 109*  BUN 9 12  CREATININE 0.55 0.60  CALCIUM  8.2* 8.3*   PT/INR No results for input(s): LABPROT, INR in the last 72 hours. ABG No results for input(s): PHART, HCO3 in the last 72 hours.  Invalid input(s): PCO2, PO2  Studies/Results: DG Abd 2 Views Result Date: 09/15/2024 EXAM: 2 VIEW XRAY OF THE ABDOMEN 09/15/2024 07:01:00 PM COMPARISON: Comparison with 09/13/2024. CLINICAL HISTORY: 855384 Pain [id number] Pain. FINDINGS: LINES, TUBES AND DEVICES: Enteric tube with tip projecting over the upper mid-abdomen consistent with location in the body of the stomach. BOWEL: Residual contrast material in the right colon. Mildly dilated gas-filled colon likely due to ileus.  Small bowel dilatation seen previously has resolved. No free intraabdominal air. No abnormal air-fluid levels. SOFT TISSUES: Skin clips along the midline consistent with recent surgery. No opaque urinary calculi. BONES: Degenerative changes in the spine and hips. No acute osseous abnormality. LUNG BASES: Atelectasis in the lung bases with probable small pleural effusions. IMPRESSION: 1. Mildly dilated gas-filled colon likely due to ileus. 2. Small bowel dilatation seen previously has resolved. 3. No free intraabdominal air or abnormal air-fluid levels. Electronically signed by: Elsie Gravely MD 09/15/2024 07:07 PM EST RP Workstation: [id number]    Anti-infectives: Anti-infectives (From admission, onward)    Start     Dose/Rate Route Frequency Ordered Stop   09/13/24 0915  ceFAZolin  (ANCEF ) IVPB 2g/100 mL premix        2 g 200 mL/hr over 30 Minutes Intravenous On call to O.R. 09/13/24 0826 09/13/24 1441       Assessment/Plan: s/p Procedure(s): LAPAROTOMY, EXPLORATORY W/LYSIS OF ADHESIONS (N/A) -  POD 3 -  Afebrile, HR 104 bpm, normotensive  -  D/C NGT and continue CLD, await further bowel function -  I adjusted her pain meds: add scheduled tylenol , scheduled advil , lidoderm  patches, PRN tramadol  -  PT/OT -  Start mucinex , I brought her an incentive spirometer, I think if her abdominal pain improves with the above adjustments she will be able to breathe/cough more comfortably.  FEN: CLD ID: no abx at present VTE: daily lovenox   Foley: none, spont voids    LOS:  6 days    Almarie GORMAN Pringle MD 09/16/2024

## 2024-09-16 NOTE — Progress Notes (Signed)
 Nutrition Follow-up  DOCUMENTATION CODES:   Severe malnutrition in context of acute illness/injury  INTERVENTION:  TPN to meet 100% of estimated caloric and protein needs. Continue until patient able to sustain >65% of needs by PO Management per pharmacy Boost Breeze po TID, each supplement provides 250 kcal and 9 grams of protein Advance diet per surgery team  NUTRITION DIAGNOSIS:  Severe Malnutrition related to acute illness as evidenced by energy intake < or equal to 50% for > or equal to 5 days, percent weight loss, moderate fat depletion, moderate muscle depletion. - Ongoing  GOAL:  Patient will meet greater than or equal to 90% of their needs - Being addressed via TPN  MONITOR:   Diet advancement, I & O's, Weight trends  REASON FOR ASSESSMENT:   Consult New TPN/TNA  ASSESSMENT:   Past medical history significant for COPD, HTN, migraine, PVD, carotid artery stenosis s/p endarterectomy, PVD, hypokalemia substance use disorder [facility]) recently admitted for intractable abdominal pain sent by PCP due to small bowel obstruction on outpatient CT.  11/18 - Admitted  11/21 - Op s/p ex-lap with lysis of adhesions 11/23 - diet advanced to clear liquids  Pt sitting up in chair, family at bedside. Reports that she is feeling ok, denies any nausea. NG now removed. Reports that she is tolerating the clear liquids and requesting a tray currently. RD to place tray order for patient. Shares that surgery team is continuing clears for now and may advance her later this afternoon to full liquids.  RD discussed starting Boost Breeze to allow for PO intake to improve and assist with meeting calories and protein needs. Pt agreeable.  Admission Weight: 57.2 kg Current Weight: 60.5 kg (09/16/24)  Nutrition Related Medications: Novolog  0-9 units q4h, Protonix  Labs: Sodium 137, Potassium 4.0, Phosphorus 3.6, Magnesium  2.1  CBG: 123-143 mg/dL x 24 hrs   Diet Order:   Diet Order              Diet clear liquid Room service appropriate? Yes; Fluid consistency: Thin  Diet effective now                  EDUCATION NEEDS:  Education needs have been addressed  Skin:  Skin Assessment: Reviewed RN Assessment  Last BM:  11/11  Height:  Ht Readings from Last 1 Encounters:  09/13/24 5' (1.524 m)   Weight:  Wt Readings from Last 1 Encounters:  09/16/24 60.5 kg   Ideal Body Weight:  45.5 kg  BMI:  Body mass index is 26.05 kg/m.  Estimated Nutritional Needs:  Kcal:  1530-1760 Protein:  68-88 g Fluid:  1.5-1.7L/d   Nestora Glatter RD, LDN Registered Dietitian I Please see AMION for contact information

## 2024-09-16 NOTE — Progress Notes (Signed)
 Physical Therapy Evaluation Patient Details Name: Jasmine Acosta MRN: [id number] DOB: 22-Apr-1951 Today's Date: 09/16/2024  History of Present Illness  Pt is a 74 yo female admitted with intractable abdominal pain and found to have a SBO on 09/10/24. Pt underwent exploratory lap with lysis of adhesions on 09/13/24. NG tube removed. PMH: COPD, HTN, migraines, carotid stenosis s/p endartarectomy.  Clinical Impression  Pt admitted with above diagnosis. AT baseline pt lives alone and is very independent.  She plans to stay with great niece (who is a engineer, civil (consulting)) and her family at d/c.  Today, pt motivated and with good pain control.  She was able to ambulate 160' with RW and CGA for safety but no physical assistance needed and demonstrated good balance.  Will maintain on caseload to advance while inpatient but expected to progress well and no immediate PT needs at d/c.  Pt currently with functional limitations due to the deficits listed below (see PT Problem List). Pt will benefit from acute skilled PT to increase their independence and safety with mobility to allow discharge.           If plan is discharge home, recommend the following: Assistance with cooking/housework;Help with stairs or ramp for entrance   Can travel by private vehicle        Equipment Recommendations Rolling walker (2 wheels)  Recommendations for Other Services       Functional Status Assessment Patient has had a recent decline in their functional status and demonstrates the ability to make significant improvements in function in a reasonable and predictable amount of time.     Precautions / Restrictions Precautions Precautions: Fall Precaution/Restrictions Comments: [name]      Mobility  Bed Mobility               General bed mobility comments: pt in chair on arrival - did educated on bed transfers as well as sleeping positions of comfort; pt reports not much difficulty getting OOB    Transfers Overall transfer  level: Needs assistance Equipment used: Rolling walker (2 wheels) Transfers: Sit to/from Stand Sit to Stand: Contact guard assist           General transfer comment: good hand placement; CGA safety    Ambulation/Gait Ambulation/Gait assistance: Contact guard assist Gait Distance (Feet): 160 Feet Assistive device: Rolling walker (2 wheels) Gait Pattern/deviations: Step-through pattern Gait velocity: functional     General Gait Details: min cues for RW  Stairs            Wheelchair Mobility     Tilt Bed    Modified Rankin (Stroke Patients Only)       Balance Overall balance assessment: Needs assistance Sitting-balance support: No upper extremity supported Sitting balance-Leahy Scale: Good     Standing balance support: No upper extremity supported Standing balance-Leahy Scale: Good                               Pertinent Vitals/Pain Pain Assessment Pain Assessment: Faces Faces Pain Scale: Hurts a little bit Pain Location: abdomen Pain Descriptors / Indicators: Discomfort Pain Intervention(s): Limited activity within patient's tolerance, Monitored during session, Premedicated before session, Repositioned    Home Living Family/patient expects to be discharged to:: Private residence Living Arrangements: Alone Available Help at Discharge: Family;Available 24 hours/day;Other (Comment) (going to stay with great niece (she is nurse and husband is a PT)) Type of Home: House Home Access: Stairs to enter Entrance Stairs-Rails:  None Entrance Stairs-Number of Steps: 1   Home Layout: Two level;Able to live on main level with bedroom/bathroom;Other (Comment);1/2 bath on main level Home Equipment: None Additional Comments: Pt lives alone in apartment with 13 steps to get in so will go home with neice. Is thinking about moving in permenently with neice. Neice has two children and is a engineer, civil (consulting)    Prior Function Prior Level of Function :  Independent/Modified Independent             Mobility Comments: Independent with community ambulation ADLs Comments: independent adls and iadls; does not drive     Extremity/Trunk Assessment   Upper Extremity Assessment Upper Extremity Assessment: Overall WFL for tasks assessed    Lower Extremity Assessment Lower Extremity Assessment: Overall WFL for tasks assessed (at least 3/5 but not further tested due to abdominal sx)    Cervical / Trunk Assessment Cervical / Trunk Assessment: Other exceptions Cervical / Trunk Exceptions: abdominal sx  Communication        Cognition Arousal: Alert Behavior During Therapy: WFL for tasks assessed/performed   PT - Cognitive impairments: No apparent impairments                       PT - Cognition Comments: motivated; niece present         Cueing       General Comments General comments (skin integrity, edema, etc.): Pt very motivated.  Encouraged ambulation with staff and family as able.  Discussed PT would follow inpatient but likely no immediate needs post op, additionally niece's spouse is a PT.  Did discuss could consider outpt PT in future for core strengthening when cleared by sx.  Educated on abdominal precautions (transfer technique and no heavy lifting)    Exercises     Assessment/Plan    PT Assessment Patient needs continued PT services  PT Problem List Decreased mobility;Decreased activity tolerance;Decreased balance;Decreased knowledge of use of DME       PT Treatment Interventions DME instruction;Therapeutic exercise;Gait training;Stair training;Functional mobility training;Therapeutic activities;Patient/family education;Balance training    PT Goals (Current goals can be found in the Care Plan section)  Acute Rehab PT Goals Patient Stated Goal: return home to nieces PT Goal Formulation: With patient/family Time For Goal Achievement: 09/30/24 Potential to Achieve Goals: Good    Frequency Min  1X/week     Co-evaluation               AM-PAC PT 6 Clicks Mobility  Outcome Measure Help needed turning from your back to your side while in a flat bed without using bedrails?: A Little Help needed moving from lying on your back to sitting on the side of a flat bed without using bedrails?: A Little Help needed moving to and from a bed to a chair (including a wheelchair)?: A Little Help needed standing up from a chair using your arms (e.g., wheelchair or bedside chair)?: A Little Help needed to walk in hospital room?: A Little Help needed climbing 3-5 steps with a railing? : A Little 6 Click Score: 18    End of Session Equipment Utilized During Treatment: Gait belt Activity Tolerance: Patient tolerated treatment well Patient left: in chair;with call bell/phone within reach;with family/visitor present Nurse Communication: Mobility status PT Visit Diagnosis: Other abnormalities of gait and mobility (R26.89);Muscle weakness (generalized) (M62.81)    Time: 1140-1157 PT Time Calculation (min) (ACUTE ONLY): 17 min   Charges:   PT Evaluation $PT Eval Low Complexity: 1 Low  PT General Charges $$ ACUTE PT VISIT: 1 Visit         Benjiman, PT Acute Rehab Services [facility] [phone]   Benjiman VEAR Mulberry 09/16/2024, 12:19 PM

## 2024-09-16 NOTE — Progress Notes (Signed)
 PROGRESS NOTE    Jasmine Acosta  FMW:[id number] DOB: 01-18-1951 DOA: 09/10/2024 PCP: Lenon Layman ORN, MD   Brief Narrative:   73 y.o. female with past medical history significant for COPD, HTN, migraine, PVD, carotid artery stenosis s/p endarterectomy, PVD, hypokalemia substance use disorder presented hospital with intractable abdominal pain. CT scan of abdomen and pelvis showed severe small bowel obstruction with transition zone in the pelvis. Patient was given symptomatic care, general surgery was consulted  Status post exploratory laparotomy with lysis of adhesions done by surgery on 09/13/2024. Slowly recovering She is on TPN and NG tube is in place.  Assessment & Plan:  Principal Problem:   Small bowel obstruction (HCC) Active Problems:   Hypokalemia   Absolute anemia   Occlusion of carotid artery   Protein-calorie malnutrition, severe   Small bowel obstruction POA: Status post exploratory laparotomy with lysis of adhesions done on 09/13/2024 She is passing flatus now.  No bowel movements yet. History of abdominal hysterectomy presented with abdominal pain, nausea and vomiting.   CT abdomen showed severe small bowel obstruction.  General surgery on board.   ON CLD continue antiemetics, analgesics (changed frequency of intravenous Dilaudid  0.5 mg from every 4 hours to every 3 hours on 09/14/2024) and supportive care.    NG-tube is in place Continue TPN with close monitoring of electrolytes.  PICC line in place.  TPN was started on 09/12/2024.Goal TPN rate is 60 mL/hr Dced foley Xray abdomen last night showed ileus.   Hypokalemia Replete as needed   Hypophosphatemia.  Replete as needed.   Essential hypertension. Continue with as needed intravenous hydralazine    COPD. Continue DuoNebs.  Appears stable.   History of peripheral vascular disease with intermittent claudication  Carotid artery stenosis s/p endarterectomy Status post stenting with CT showing patent  stents in both common iliac arteries. Continue Plavix  when p.o. able.   Vitamin B12 deficiency On vitamin B12 as outpatient.    Nutrition Status:Body mass index is 25.88 kg/m.  Nutrition Problem: Severe Malnutrition Etiology: acute illness Signs/Symptoms: energy intake < or equal to 50% for > or equal to 5 days, percent weight loss, moderate fat depletion, moderate muscle depletion Percent weight loss: 2.2 % Interventions: TPN, MVI Currently on TPN due to bowel obstruction and status post surgery.  Disposition: Home.  She may need home health services on discharge.  PT and OT consulted.  DVT prophylaxis: enoxaparin  (LOVENOX ) injection 40 mg Start: 09/10/24 2130     Code Status: Limited: Do not attempt resuscitation (DNR) -DNR-LIMITED -Do Not Intubate/DNI  Family Communication: None at the bedside Status is: Inpatient Remains inpatient appropriate because: Small bowel obstruction, status post exploratory laparatomy    Subjective:  She was sitting in the chair. Abdomen feels less sore. NGT is still in place and TPN is going on. She did tolerate CLD. She had apple juice, chicken soup, tea and Jello last night. No episodes of vomiting. No bowel movements yet but passing flatus.  Examination:  General exam: Appears calm, NG tube in place Respiratory system: Clear to auscultation. Respiratory effort normal. Cardiovascular system: S1 & S2 heard, RRR. No JVD, murmurs, rubs, gallops or clicks. No pedal edema. Gastrointestinal system: Status post exploratory laparotomy with mesh in place, slight peri-incisional tenderness, diminished bowel sounds Central nervous system: Alert and oriented. No focal neurological deficits. Extremities: Symmetric 5 x 5 power. Skin: No rashes, lesions or ulcers Psychiatry: Judgement and insight appear normal. Mood & affect appropriate.      Diet Orders (  From admission, onward)     Start     Ordered   09/15/24 1149  Diet clear liquid Room service  appropriate? Yes; Fluid consistency: Thin  Diet effective now       Question Answer Comment  Room service appropriate? Yes   Fluid consistency: Thin      09/15/24 1149            Objective: Vitals:   09/15/24 2006 09/16/24 0357 09/16/24 0410 09/16/24 0810  BP: 130/66 122/72  120/78  Pulse: (!) 109 (!) 106  (!) 104  Resp: 17 17  19   Temp: 98.2 F (36.8 C) 98 F (36.7 C)  97.7 F (36.5 C)  TempSrc: Oral   Oral  SpO2: 91% 95%  94%  Weight:   60.5 kg   Height:        Intake/Output Summary (Last 24 hours) at 09/16/2024 0910 Last data filed at 09/16/2024 9491 Gross per 24 hour  Intake 691.67 ml  Output --  Net 691.67 ml   Filed Weights   09/13/24 1009 09/15/24 0442 09/16/24 0410  Weight: 60.1 kg 60.4 kg 60.5 kg    Scheduled Meds:  Chlorhexidine  Gluconate Cloth  6 each Topical Daily   enoxaparin  (LOVENOX ) injection  40 mg Subcutaneous Q24H   insulin  aspart  0-9 Units Subcutaneous Q4H   pantoprazole  (PROTONIX ) IV  40 mg Intravenous Q24H   Continuous Infusions:  TPN ADULT (ION) 60 mL/hr at 09/16/24 0508   TPN ADULT (ION)      Nutritional status Signs/Symptoms: energy intake < or equal to 50% for > or equal to 5 days, percent weight loss, moderate fat depletion, moderate muscle depletion Percent weight loss: 2.2 % Interventions: TPN, MVI Body mass index is 26.05 kg/m.  Data Reviewed:   CBC: Recent Labs  Lab 09/10/24 2002 09/11/24 0302 09/12/24 0401 09/13/24 0411  WBC 12.3* 10.0 9.0 8.0  NEUTROABS 9.8*  --   --   --   HGB 13.5 11.7* 12.0 11.7*  HCT 41.0 35.5* 38.0 36.6  MCV 89.7 91.0 93.1 92.7  PLT PLATELET CLUMPS NOTED ON SMEAR, UNABLE TO ESTIMATE 159 160 177   Basic Metabolic Panel: Recent Labs  Lab 09/12/24 0401 09/13/24 0411 09/13/24 1941 09/14/24 0040 09/15/24 0236 09/16/24 0429  NA 141 142 141 142 141 137  K 3.3* 3.3* 3.9 4.3 3.8 4.0  CL 102 104 106 107 104 102  CO2 21* 28 27 26  32 29  GLUCOSE 94 149* 163* 142* 130* 109*  BUN 15 13 9  9 9 12   CREATININE 0.94 0.70 0.70 0.67 0.55 0.60  CALCIUM  8.6* 8.5* 7.7* 7.8* 8.2* 8.3*  MG 2.2 2.1  --  1.8 2.3 2.1  PHOS 4.1 2.1* 3.5 4.4 3.3 3.6   GFR: Estimated Creatinine Clearance: 50.2 mL/min (by C-G formula based on SCr of 0.6 mg/dL). Liver Function Tests: Recent Labs  Lab 09/10/24 2002 09/11/24 0302 09/16/24 0429  AST 18 16 17   ALT 12 11 11   ALKPHOS 58 48 41  BILITOT 1.4* 1.1 0.5  PROT 7.2 6.0* 5.7*  ALBUMIN  3.8 3.1* 2.5*   Recent Labs  Lab 09/10/24 2002  LIPASE 53*   No results for input(s): AMMONIA in the last 168 hours. Coagulation Profile: No results for input(s): INR, PROTIME in the last 168 hours. Cardiac Enzymes: No results for input(s): CKTOTAL, CKMB, CKMBINDEX, TROPONINI in the last 168 hours. BNP (last 3 results) No results for input(s): PROBNP in the last 8760 hours. HbA1C: No results  for input(s): HGBA1C in the last 72 hours. CBG: Recent Labs  Lab 09/15/24 1244 09/15/24 1625 09/15/24 2011 09/15/24 2356 09/16/24 0356  GLUCAP 141* 144* 123* 127* 143*   Lipid Profile: Recent Labs    09/16/24 0429  TRIG 78   Thyroid  Function Tests: No results for input(s): TSH, T4TOTAL, FREET4, T3FREE, THYROIDAB in the last 72 hours. Anemia Panel: No results for input(s): VITAMINB12, FOLATE, FERRITIN, TIBC, IRON, RETICCTPCT in the last 72 hours. Sepsis Labs: No results for input(s): PROCALCITON, LATICACIDVEN in the last 168 hours.  No results found for this or any previous visit (from the past 240 hours).        Radiology Studies: DG Abd 2 Views Result Date: 09/15/2024 EXAM: 2 VIEW XRAY OF THE ABDOMEN 09/15/2024 07:01:00 PM COMPARISON: Comparison with 09/13/2024. CLINICAL HISTORY: 855384 Pain 244615 Pain. FINDINGS: LINES, TUBES AND DEVICES: Enteric tube with tip projecting over the upper mid-abdomen consistent with location in the body of the stomach. BOWEL: Residual contrast material in the right colon.  Mildly dilated gas-filled colon likely due to ileus. Small bowel dilatation seen previously has resolved. No free intraabdominal air. No abnormal air-fluid levels. SOFT TISSUES: Skin clips along the midline consistent with recent surgery. No opaque urinary calculi. BONES: Degenerative changes in the spine and hips. No acute osseous abnormality. LUNG BASES: Atelectasis in the lung bases with probable small pleural effusions. IMPRESSION: 1. Mildly dilated gas-filled colon likely due to ileus. 2. Small bowel dilatation seen previously has resolved. 3. No free intraabdominal air or abnormal air-fluid levels. Electronically signed by: Elsie Gravely MD 09/15/2024 07:07 PM EST RP Workstation: [id number]            LOS: 6 days   Time spent= 40 mins    Deliliah Room, MD [facility]  If 7PM-7AM, please contact night-coverage  09/16/2024, 9:10 AM

## 2024-09-16 NOTE — Evaluation (Signed)
 Occupational Therapy Evaluation Patient Details Name: Jasmine Acosta MRN: [id number] DOB: [birth year] Today's Date: 09/16/2024   History of Present Illness   Pt is a 73 yo female admitted with intractable abdominal pain and found to have a SBO. Pt underwent exploratory lap with lysis of adhesions on 09/13/24. Pt with NG tube receiving TPN and on clear liquid diet. PMH: COPD, HTN, migraines, carotid stenosis s/p endartarectomy.     Clinical Impressions Pt admitted with the above diagnosis and has the deficits outlined below. Pt would benefit from cont OT to increase independence with basic adls and adl transfers so she can d/c home with her niece before d/cing home alone. Pt was previously independent outside of driving and uses no assistive devices. Pt states she is considering moving in with this niece for good but no decisions have been made. Feel pt would benefit from [facility] but by the time she goes home may not need these services. Will continue to see acutely.     If plan is discharge home, recommend the following:   A little help with walking and/or transfers;A little help with bathing/dressing/bathroom;Assistance with cooking/housework;Assist for transportation;Help with stairs or ramp for entrance     Functional Status Assessment   Patient has had a recent decline in their functional status and demonstrates the ability to make significant improvements in function in a reasonable and predictable amount of time.     Equipment Recommendations   None recommended by OT     Recommendations for Other Services         Precautions/Restrictions   Precautions Precautions: Fall;Other (comment) (NG tube) Recall of Precautions/Restrictions: Intact Precaution/Restrictions Comments: NG tube Restrictions Weight Bearing Restrictions Per Provider Order: No     Mobility Bed Mobility               General bed mobility comments: pt in chair on arrival     Transfers Overall transfer level: Needs assistance Equipment used: Rolling walker (2 wheels) Transfers: Sit to/from Stand Sit to Stand: Contact guard assist           General transfer comment: cues for hand placement      Balance Overall balance assessment: Mild deficits observed, not formally tested                                         ADL either performed or assessed with clinical judgement   ADL Overall ADL's : Needs assistance/impaired Eating/Feeding: Set up;Sitting Eating/Feeding Details (indicate cue type and reason): clear liquids only Grooming: Wash/dry hands;Wash/dry face;Contact guard assist;Standing   Upper Body Bathing: Set up;Sitting   Lower Body Bathing: Contact guard assist;Sit to/from stand   Upper Body Dressing : Set up;Sitting   Lower Body Dressing: Sit to/from stand;Minimal assistance;Cueing for compensatory techniques   Toilet Transfer: Contact guard assist;Ambulation;Comfort height toilet;Rolling walker (2 wheels);Grab bars Toilet Transfer Details (indicate cue type and reason): pt walked to bathroom Toileting- Clothing Manipulation and Hygiene: Minimal assistance;Sit to/from stand       Functional mobility during ADLs: Contact guard assist;Rolling walker (2 wheels) General ADL Comments: Assist to access LEs due to pain but pt can sit in figure 4 positon, it is just painful     Vision Baseline Vision/History: 0 No visual deficits Ability to See in Adequate Light: 0 Adequate Patient Visual Report: No change from baseline Vision Assessment?: No apparent visual deficits  Perception Perception: Within Functional Limits       Praxis Praxis: WFL       Pertinent Vitals/Pain Pain Assessment Pain Assessment: 0-10 Pain Score: 8  Pain Location: abdomen Pain Descriptors / Indicators: Discomfort, Grimacing, Sore Pain Intervention(s): Limited activity within patient's tolerance, Monitored during session, Premedicated  before session, Repositioned     Extremity/Trunk Assessment Upper Extremity Assessment Upper Extremity Assessment: Overall WFL for tasks assessed   Lower Extremity Assessment Lower Extremity Assessment: Defer to PT evaluation   Cervical / Trunk Assessment Cervical / Trunk Assessment: Normal   Communication Communication Communication: No apparent difficulties   Cognition Arousal: Alert Behavior During Therapy: WFL for tasks assessed/performed Cognition: No apparent impairments             OT - Cognition Comments: intact                 Following commands: Intact       Cueing  General Comments      Pt mobilized well for first time up walking in days.  Pt fatigues quickly but is very motivated as she was very independent PTA   Exercises     Shoulder Instructions      Home Living Family/patient expects to be discharged to:: Private residence Living Arrangements: Alone Available Help at Discharge: Family;Available 24 hours/day;Other (Comment) (will go home with neice) Type of Home: House Home Access: Stairs to enter Entrance Stairs-Number of Steps: 1 Entrance Stairs-Rails: None Home Layout: Bed/bath upstairs;Two level;Able to live on main level with bedroom/bathroom;Other (Comment) (bath on mail level is just sink and toilet but can sponge bathe if needed)     Bathroom Shower/Tub: Other (comment) (pt not sure)   Bathroom Toilet: Standard     Home Equipment: None   Additional Comments: Pt lives alone in apartment with 13 steps to get in so will go home with neice. Is thinking about moving in permenently with neice. Neice has two children and is a engineer, civil (consulting)      Prior Functioning/Environment Prior Level of Function : Independent/Modified Independent             Mobility Comments: no assist PTA ADLs Comments: no assist PTA    OT Problem List: Decreased activity tolerance;Impaired balance (sitting and/or standing);Pain   OT  Treatment/Interventions: Self-care/ADL training;DME and/or AE instruction;Energy conservation;Therapeutic activities;Balance training      OT Goals(Current goals can be found in the care plan section)   Acute Rehab OT Goals Patient Stated Goal: to get home with my neice OT Goal Formulation: With patient Time For Goal Achievement: 09/30/24 Potential to Achieve Goals: Good ADL Goals Pt Will Perform Grooming: with modified independence;standing Pt Will Perform Tub/Shower Transfer: Tub transfer;Shower transfer;with supervision;ambulating;rolling walker Additional ADL Goal #1: Pt will walk to bathroom with walker and complete toileting with mod I Additional ADL Goal #2: Pt will gather all clothing from closet with walker and dress self with mod I Additional ADL Goal #3: Pt will state three things she can do at home to change up ADL routine to focus on energy conservation without cues.   OT Frequency:  Min 2X/week    Co-evaluation              AM-PAC OT 6 Clicks Daily Activity     Outcome Measure Help from another person eating meals?: A Little Help from another person taking care of personal grooming?: A Little Help from another person toileting, which includes using toliet, bedpan, or urinal?: A Little Help from  another person bathing (including washing, rinsing, drying)?: A Little Help from another person to put on and taking off regular upper body clothing?: A Little Help from another person to put on and taking off regular lower body clothing?: A Little 6 Click Score: 18   End of Session Equipment Utilized During Treatment: Rolling walker (2 wheels);Gait belt Nurse Communication: Mobility status  Activity Tolerance: Patient tolerated treatment well Patient left: in chair;with call bell/phone within reach  OT Visit Diagnosis: Unsteadiness on feet (R26.81);Pain Pain - part of body:  (abdpmen)                Time: 9245-9187 OT Time Calculation (min): 18 min Charges:  OT  General Charges $OT Visit: 1 Visit OT Evaluation $OT Eval Low Complexity: 1 Low  Joshua Silvano Dragon 09/16/2024, 8:21 AM

## 2024-09-16 NOTE — Progress Notes (Signed)
 NG tube no longer place per patient tube removed by provider. Sherline Jubilee

## 2024-09-17 DIAGNOSIS — K56609 Unspecified intestinal obstruction, unspecified as to partial versus complete obstruction: Secondary | ICD-10-CM | POA: Diagnosis not present

## 2024-09-17 LAB — BASIC METABOLIC PANEL WITH GFR
Anion gap: 10 (ref 5–15)
BUN: 15 mg/dL (ref 8–23)
CO2: 28 mmol/L (ref 22–32)
Calcium: 8.8 mg/dL — ABNORMAL LOW (ref 8.9–10.3)
Chloride: 100 mmol/L (ref 98–111)
Creatinine, Ser: 0.52 mg/dL (ref 0.44–1.00)
GFR, Estimated: >60 mL/min (ref >60)
Glucose, Bld: 114 mg/dL — ABNORMAL HIGH (ref 70–99)
Potassium: 4.2 mmol/L (ref 3.5–5.1)
Sodium: 138 mmol/L (ref 135–145)

## 2024-09-17 LAB — CBC
HCT: 35 % — ABNORMAL LOW (ref 36.0–46.0)
Hemoglobin: 10.8 g/dL — ABNORMAL LOW (ref 12.0–15.0)
MCH: 29 pg (ref 26.0–34.0)
MCHC: 30.9 g/dL (ref 30.0–36.0)
MCV: 94.1 fL (ref 80.0–100.0)
Platelets: 204 K/uL (ref 150–400)
RBC: 3.72 MIL/uL — ABNORMAL LOW (ref 3.87–5.11)
RDW: 13 % (ref 11.5–15.5)
WBC: 9.5 K/uL (ref 4.0–10.5)
nRBC: 0 % (ref 0.0–0.2)

## 2024-09-17 LAB — GLUCOSE, CAPILLARY
Glucose-Capillary: 120 mg/dL — ABNORMAL HIGH (ref 70–99)
Glucose-Capillary: 133 mg/dL — ABNORMAL HIGH (ref 70–99)
Glucose-Capillary: 121 mg/dL — ABNORMAL HIGH (ref 70–99)
Glucose-Capillary: 110 mg/dL — ABNORMAL HIGH (ref 70–99)
Glucose-Capillary: 120 mg/dL — ABNORMAL HIGH (ref 70–99)

## 2024-09-17 MED ORDER — SENNOSIDES-DOCUSATE SODIUM 8.6-50 MG PO TABS
1.0000 | ORAL_TABLET | Freq: Two times a day (BID) | ORAL | Status: DC
  Administered 2024-09-17 – 2024-09-20 (×6): 1 via ORAL
  Filled 2024-09-17 (×6): qty 1

## 2024-09-17 MED ORDER — POLYETHYLENE GLYCOL 3350 17 G PO PACK
17.0000 g | PACK | Freq: Every day | ORAL | Status: DC | PRN
Start: 2024-09-17 — End: 2024-09-20

## 2024-09-17 MED ORDER — TRAVASOL 10 % IV SOLN
INTRAVENOUS | Status: AC
  Filled 2024-09-17: qty 699.8

## 2024-09-17 MED ORDER — CLOPIDOGREL BISULFATE 75 MG PO TABS
75.0000 mg | ORAL_TABLET | Freq: Every day | ORAL | Status: DC
Start: 2024-09-18 — End: 2024-09-20
  Administered 2024-09-18 – 2024-09-20 (×3): 75 mg via ORAL
  Filled 2024-09-17 (×3): qty 1

## 2024-09-17 NOTE — Plan of Care (Signed)

## 2024-09-17 NOTE — TOC Initial Note (Addendum)
 Transition of Care [facility]) - Initial/Assessment Note    Patient Details  Name: Jasmine Acosta MRN: [id number] Date of Birth: 1951-10-24  Transition of Care [facility]) CM/SW Contact:    Lauraine FORBES Saa, LCSWA Phone Number: 09/17/2024, 9:07 AM  Clinical Narrative:                  9:07 AM Medical team informed CSW that patient is no longer with NG tube but remains on TPN. Per chart review, therapy did not have discharge recommendations, and patient is anticipated to discharge to niece's home. TOC will continue to follow.  Expected Discharge Plan: Home/Self Care Barriers to Discharge: Continued Medical Work up   Patient Goals and CMS Choice            Expected Discharge Plan and Services       Living arrangements for the past 2 months: Single Family Home                                      Prior Living Arrangements/Services Living arrangements for the past 2 months: Single Family Home Lives with:: Self Patient language and need for interpreter reviewed:: Yes        Need for Family Participation in Patient Care: No (Comment) Care giver support system in place?: No (comment)   Criminal Activity/Legal Involvement Pertinent to Current Situation/Hospitalization: No - Comment as needed  Activities of Daily Living   ADL Screening (condition at time of admission) Independently performs ADLs?: Yes (appropriate for developmental age) Is the patient deaf or have difficulty hearing?: No Does the patient have difficulty seeing, even when wearing glasses/contacts?: No Does the patient have difficulty concentrating, remembering, or making decisions?: No  Permission Sought/Granted Permission sought to share information with : Family Supports Permission granted to share information with : No (Contact information on chart)  Share Information with NAME: Arlyne Ramseur     Permission granted to share info w Relationship: Friend  Permission granted to share info w Contact  Information: [phone]  Emotional Assessment       Orientation: : Oriented to Self, Oriented to Place, Oriented to  Time, Oriented to Situation Alcohol / Substance Use: Not Applicable Psych Involvement: No (comment)  Admission diagnosis:  Small bowel obstruction (HCC) [K56.609] Patient Active Problem List   Diagnosis Date Noted   Protein-calorie malnutrition, severe 09/13/2024   Small bowel obstruction (HCC) 09/10/2024   Hypokalemia 09/10/2024   Absolute anemia 09/10/2024   Occlusion of carotid artery 09/10/2024   Intractable abdominal pain 09/03/2024   Migraines 09/03/2024   Abnormal LFTs (liver function tests) 07/05/2021   Iron deficiency anemia 06/28/2019   History of adenomatous polyp of colon 06/27/2019   Personal history of arterial venous malformation (AVM) 06/27/2019   Carotid stenosis, s/p endarterectomy 06/14/2018   Hyperlipidemia 04/06/2018   Atherosclerosis of native arteries of extremity with intermittent claudication 04/06/2018   Aortic atherosclerosis 04/06/2018   Pseudoclaudication 09/05/2017   Menopause 06/28/2017   Status post hysterectomy 06/28/2017   Tobacco user 06/28/2017   PVD (peripheral vascular disease) 04/06/2017   Hip pain 03/04/2016   Impingement syndrome of shoulder region 08/13/2015   COPD (chronic obstructive pulmonary disease) (HCC) 07/06/2015   Health care maintenance 07/06/2015   Bilateral carotid artery disease 11/24/2014   Chronic migraine without aura without status migrainosus, not intractable 11/24/2014   Essential hypertension 11/24/2014   PCP:  Lenon Layman ORN, MD Pharmacy:  [id number] [address], [place] - [address] AT [facility] [address] [place] [address] Phone: [phone] Fax: [phone]  [facility] [address] [place] [address] Phone: [phone] Fax: [phone]     Social Drivers of Health  (SDOH) Social History: SDOH Screenings   Food Insecurity: Patient Declined (09/11/2024)  Housing: Unknown (09/11/2024)  Transportation Needs: Patient Declined (09/11/2024)  Utilities: Patient Declined (09/11/2024)  Financial Resource Strain: Low Risk  (02/13/2024)   Received from [facility]  Social Connections: Patient Declined (09/11/2024)  Tobacco Use: Medium Risk (09/13/2024)   SDOH Interventions:     Readmission Risk Interventions     No data to display

## 2024-09-17 NOTE — Progress Notes (Signed)
 4 Days Post-Op   Subjective/Chief Complaint: Her cc is diaphoresis. Tolerating CLD. +flatus. No BM yet. Ongoing cough.  Pulled 500-600 cc on IS for me. Denies dysuria or hematuria.    Objective: Vital signs in last 24 hours: Temp:  [97.7 F (36.5 C)-98.9 F (37.2 C)] 98.9 F (37.2 C) (11/25 0759) Pulse Rate:  [87-106] 90 (11/25 0759) Resp:  [16-18] 18 (11/25 0759) BP: (130-149)/(72-88) 134/76 (11/25 0759) SpO2:  [96 %-97 %] 96 % (11/25 0759) Weight:  [58.5 kg] 58.5 kg (11/25 0500) Last BM Date : 09/04/24  Intake/Output from previous day: 11/24 0701 - 11/25 0700 In: 1417 [I.V.:1417] Out: -  Intake/Output this shift: No intake/output data recorded. Gen: alert, NAD Pulm: normal effort on room air Abdomen: mild distention.  Soft.  Tender around incision.  Incision clean dry intact with honeycomb dressing in place  Lab Results:  Recent Labs    09/16/24 1213  WBC 14.9*  HGB 11.2*  HCT 36.2  PLT 226   BMET Recent Labs    09/15/24 0236 09/16/24 0429  NA 141 137  K 3.8 4.0  CL 104 102  CO2 32 29  GLUCOSE 130* 109*  BUN 9 12  CREATININE 0.55 0.60  CALCIUM  8.2* 8.3*   PT/INR No results for input(s): LABPROT, INR in the last 72 hours. ABG No results for input(s): PHART, HCO3 in the last 72 hours.  Invalid input(s): PCO2, PO2  Studies/Results: DG Abd 2 Views Result Date: 09/15/2024 EXAM: 2 VIEW XRAY OF THE ABDOMEN 09/15/2024 07:01:00 PM COMPARISON: Comparison with 09/13/2024. CLINICAL HISTORY: 855384 Pain 244615 Pain. FINDINGS: LINES, TUBES AND DEVICES: Enteric tube with tip projecting over the upper mid-abdomen consistent with location in the body of the stomach. BOWEL: Residual contrast material in the right colon. Mildly dilated gas-filled colon likely due to ileus. Small bowel dilatation seen previously has resolved. No free intraabdominal air. No abnormal air-fluid levels. SOFT TISSUES: Skin clips along the midline consistent with recent surgery. No  opaque urinary calculi. BONES: Degenerative changes in the spine and hips. No acute osseous abnormality. LUNG BASES: Atelectasis in the lung bases with probable small pleural effusions. IMPRESSION: 1. Mildly dilated gas-filled colon likely due to ileus. 2. Small bowel dilatation seen previously has resolved. 3. No free intraabdominal air or abnormal air-fluid levels. Electronically signed by: Elsie Gravely MD 09/15/2024 07:07 PM EST RP Workstation: [id number]    Anti-infectives: Anti-infectives (From admission, onward)    Start     Dose/Rate Route Frequency Ordered Stop   09/13/24 0915  ceFAZolin  (ANCEF ) IVPB 2g/100 mL premix        2 g 200 mL/hr over 30 Minutes Intravenous On call to O.R. 09/13/24 0826 09/13/24 1441       Assessment/Plan: s/p Procedure(s): LAPAROTOMY, EXPLORATORY W/LYSIS OF ADHESIONS (N/A) 11/21 Dr. Aron -  POD 4 -  Afebrile, HR 90 bpm, normotensive  -  advance to FLD + ensure, ok to 1/2 TPN -  PT/OT -  Start mucinex , I brought her an incentive spirometer, I think if her abdominal pain improves with the above adjustments she will be able to breathe/cough more comfortably.  FEN: FLD, ensure, 1/2 TPN ID: no abx at present; WBC was increased yesterday to 14.9 from 8.0, pending today; if continues to increase will get a CXR to start leukocytosis workup,  VTE: daily lovenox   Foley: none, spont voids    LOS: 7 days    Almarie GORMAN Pringle PA-C 09/17/2024

## 2024-09-17 NOTE — Progress Notes (Signed)
 PHARMACY - TOTAL PARENTERAL NUTRITION CONSULT NOTE   Indication: Small bowel obstruction  Patient Measurements: Height: 5' (152.4 cm) Weight: 58.5 kg (128 lb 14.4 oz) IBW/kg (Calculated) : 45.5 TPN AdjBW (KG): 49.1 Body mass index is 25.17 kg/m. Usual Weight: ~60 kg  Assessment:  73 yo F who presented with abdominal pain, nausea, and vomiting. She was recently admitted 11/11-11/13 w abdominal pain and poor PO intake. She has had continued poor PO intake. She was found to have a small bowel obstruction with transition in the pelvis. Pharmacy was consulted for TPN in setting of prolonged NPO status and small bowel obstruction. High refeeding risk.   Glucose / Insulin : 01/2024 A1c= 5.0 - CBG <180. Used 5 units of SSI in last 24 hours. Electrolytes: Na 138, K 4.2, Ch 100, Ca 8.8 [CoCa 9.52], Phos 3.6 (goal >3), Mg 2.1 (goal >2) Renal: scr 0.52, BUN 15 Hepatic: LFT/Tbili/TG within normal limits, Alb 3.1  Intake / Output; MIVF: UOP 4+ unmeasured  GI Imaging:  11/20 KUB multiple dilated small bowel loops c/w SBO (unchanged from prior)  GI Surgeries / Procedures:  11/21 Exlap, lysis of adhesions  Central access: PICC 09/12/2024  TPN start date: 09/12/2024  Nutritional Goals: Goal TPN rate is 60 mL/hr (provides 69.9 g of protein and 1597 kcals per day)  RD Assessment: Estimated Needs Total Energy Estimated Needs: 1530-1760 Total Protein Estimated Needs: 68-88 g Total Fluid Estimated Needs: 1.5-1.7L/d  Current Nutrition:  Clear liquids  Plan:  Continue TPN at goal of 60 mL/hr at 1800 - to provide 100% of needs Electrolytes in TPN: Na 50 mEq/L, K 70 mEq/L, Ca 5 mEq/L, Mg 5 mEq/L, and Phos 20 mmol/L. Cl:Ac 1:1 Add standard MVI and trace elements to TPN Continue Sensitive q4h SSI tonight and adjust as needed  Monitor TPN labs on Mon/Thurs, daily until stable  Thiamine 100 mg x7 days in TPN (D6/7) Monitor diet advancement/toleration and ability to discontinue TPN.    Benedetta Heath BS, PharmD, BCPS Clinical Pharmacist 09/17/2024 7:58 AM  Contact: [phone] after 3 PM

## 2024-09-17 NOTE — Progress Notes (Signed)
 PROGRESS NOTE    Jasmine Acosta  FMW:[id number] DOB: [birth year] DOA: 09/10/2024 PCP: Lenon Layman ORN, MD   Brief Narrative:   73 y.o. female with past medical history significant for COPD, HTN, migraine, PVD, carotid artery stenosis s/p endarterectomy, PVD, hypokalemia substance use disorder presented hospital with intractable abdominal pain. CT scan of abdomen and pelvis showed severe small bowel obstruction with transition zone in the pelvis. Patient was given symptomatic care, general surgery was consulted  Status post exploratory laparotomy with lysis of adhesions done by surgery on 09/13/2024. Slowly recovering She is on TPN and NG tube is in place.  Assessment & Plan:  Principal Problem:   Small bowel obstruction (HCC) Active Problems:   Hypokalemia   Absolute anemia   Occlusion of carotid artery   Protein-calorie malnutrition, severe   Small bowel obstruction POA: Status post exploratory laparotomy with lysis of adhesions done on 09/13/2024 She is passing flatus now.  No bowel movements yet. History of abdominal hysterectomy presented with abdominal pain, nausea and vomiting.   CT abdomen showed severe small bowel obstruction.  General surgery on board.   On CLD.  Advance to full liquid diet today. continue antiemetics, analgesics (changed frequency of intravenous Dilaudid  0.5 mg from every 4 hours to every 3 hours on 09/14/2024) and supportive care.    Continue TPN with close monitoring of electrolytes.  PICC line in place.  TPN was started on 09/12/2024.Goal TPN rate is 60 mL/hr Dced foley Discontinued NG tube on 09/16/2024    Hypokalemia Replete as needed   Hypophosphatemia.  Replete as needed.   Essential hypertension. Continue with as needed intravenous hydralazine    COPD. Continue DuoNebs.  Appears stable.   History of peripheral vascular disease with intermittent claudication  Carotid artery stenosis s/p endarterectomy Status post stenting with CT  showing patent stents in both common iliac arteries.  Plavix  to be resumed on 09/18/2024.   Vitamin B12 deficiency On vitamin B12 as outpatient.    Nutrition Status:Body mass index is 25.88 kg/m.  Nutrition Problem: Severe Malnutrition Etiology: acute illness Signs/Symptoms: energy intake < or equal to 50% for > or equal to 5 days, percent weight loss, moderate fat depletion, moderate muscle depletion Percent weight loss: 2.2 % Interventions: TPN, MVI Currently on TPN due to bowel obstruction and status post surgery.  Disposition: Home.  She may need home health services on discharge.  PT and OT consulted.  She said that she will be staying with her niece on discharge.  DVT prophylaxis: enoxaparin  (LOVENOX ) injection 40 mg Start: 09/10/24 2130     Code Status: Limited: Do not attempt resuscitation (DNR) -DNR-LIMITED -Do Not Intubate/DNI  Family Communication: None at the bedside Status is: Inpatient Remains inpatient appropriate because: Small bowel obstruction, status post exploratory laparatomy    Subjective:  She was sitting in the chair.  She feels much better today as her NG tube was removed yesterday.  She is currently on TPN.  She was able to tolerate a clear liquid diet and the plan is to advance diet to full liquid diet today.  She is passing flatus but still had no bowel movement.  She said that once she is discharged, she will be staying with her niece.  Examination:  General exam: Appears calm, NG tube in place Respiratory system: Clear to auscultation. Respiratory effort normal. Cardiovascular system: S1 & S2 heard, RRR. No JVD, murmurs, rubs, gallops or clicks. No pedal edema. Gastrointestinal system: Status post exploratory laparotomy with mesh in place,  slight peri-incisional tenderness, diminished bowel sounds Central nervous system: Alert and oriented. No focal neurological deficits. Extremities: Symmetric 5 x 5 power. Skin: No rashes, lesions or  ulcers Psychiatry: Judgement and insight appear normal. Mood & affect appropriate.      Diet Orders (From admission, onward)     Start     Ordered   09/17/24 0827  Diet full liquid Fluid consistency: Thin  Diet effective now       Question:  Fluid consistency:  Answer:  Thin   09/17/24 0826            Objective: Vitals:   09/16/24 1941 09/17/24 0345 09/17/24 0500 09/17/24 0759  BP: 130/72 (!) 149/78  134/76  Pulse: 87 95  90  Resp: 18 16  18   Temp: 97.7 F (36.5 C) 98 F (36.7 C)  98.9 F (37.2 C)  TempSrc: Oral Oral  Oral  SpO2: 96% 97%  96%  Weight:   58.5 kg   Height:        Intake/Output Summary (Last 24 hours) at 09/17/2024 1023 Last data filed at 09/17/2024 0505 Gross per 24 hour  Intake 1417 ml  Output --  Net 1417 ml   Filed Weights   09/15/24 0442 09/16/24 0410 09/17/24 0500  Weight: 60.4 kg 60.5 kg 58.5 kg    Scheduled Meds:  acetaminophen   1,000 mg Oral TID   Chlorhexidine  Gluconate Cloth  6 each Topical Daily   enoxaparin  (LOVENOX ) injection  40 mg Subcutaneous Q24H   feeding supplement  1 Container Oral TID BM   guaiFENesin   600 mg Oral BID   ibuprofen   400 mg Oral TID   insulin  aspart  0-9 Units Subcutaneous Q4H   lidocaine   2 patch Transdermal Daily   pantoprazole  (PROTONIX ) IV  40 mg Intravenous Q24H   senna-docusate  1 tablet Oral BID   Continuous Infusions:  TPN ADULT (ION) 60 mL/hr at 09/17/24 0505   TPN ADULT (ION)      Nutritional status Signs/Symptoms: energy intake < or equal to 50% for > or equal to 5 days, percent weight loss, moderate fat depletion, moderate muscle depletion Percent weight loss: 2.2 % Interventions: TPN, MVI Body mass index is 25.17 kg/m.  Data Reviewed:   CBC: Recent Labs  Lab 09/10/24 2002 09/11/24 0302 09/12/24 0401 09/13/24 0411 09/16/24 1213 09/17/24 0842  WBC 12.3* 10.0 9.0 8.0 14.9* 9.5  NEUTROABS 9.8*  --   --   --   --   --   HGB 13.5 11.7* 12.0 11.7* 11.2* 10.8*  HCT 41.0 35.5*  38.0 36.6 36.2 35.0*  MCV 89.7 91.0 93.1 92.7 94.8 94.1  PLT PLATELET CLUMPS NOTED ON SMEAR, UNABLE TO ESTIMATE 159 160 177 226 204   Basic Metabolic Panel: Recent Labs  Lab 09/12/24 0401 09/13/24 0411 09/13/24 1941 09/14/24 0040 09/15/24 0236 09/16/24 0429 09/17/24 0842  NA 141 142 141 142 141 137 138  K 3.3* 3.3* 3.9 4.3 3.8 4.0 4.2  CL 102 104 106 107 104 102 100  CO2 21* 28 27 26  32 29 28  GLUCOSE 94 149* 163* 142* 130* 109* 114*  BUN 15 13 9 9 9 12 15   CREATININE 0.94 0.70 0.70 0.67 0.55 0.60 0.52  CALCIUM  8.6* 8.5* 7.7* 7.8* 8.2* 8.3* 8.8*  MG 2.2 2.1  --  1.8 2.3 2.1  --   PHOS 4.1 2.1* 3.5 4.4 3.3 3.6  --    GFR: Estimated Creatinine Clearance: 49.4 mL/min (by C-G formula  based on SCr of 0.52 mg/dL). Liver Function Tests: Recent Labs  Lab 09/10/24 2002 09/11/24 0302 09/16/24 0429  AST 18 16 17   ALT 12 11 11   ALKPHOS 58 48 41  BILITOT 1.4* 1.1 0.5  PROT 7.2 6.0* 5.7*  ALBUMIN  3.8 3.1* 2.5*   Recent Labs  Lab 09/10/24 2002  LIPASE 53*   No results for input(s): AMMONIA in the last 168 hours. Coagulation Profile: No results for input(s): INR, PROTIME in the last 168 hours. Cardiac Enzymes: No results for input(s): CKTOTAL, CKMB, CKMBINDEX, TROPONINI in the last 168 hours. BNP (last 3 results) No results for input(s): PROBNP in the last 8760 hours. HbA1C: No results for input(s): HGBA1C in the last 72 hours. CBG: Recent Labs  Lab 09/16/24 1645 09/16/24 2027 09/16/24 2318 09/17/24 0342 09/17/24 0801  GLUCAP 147* 139* 122* 120* 133*   Lipid Profile: Recent Labs    09/16/24 0429  TRIG 78   Thyroid  Function Tests: No results for input(s): TSH, T4TOTAL, FREET4, T3FREE, THYROIDAB in the last 72 hours. Anemia Panel: No results for input(s): VITAMINB12, FOLATE, FERRITIN, TIBC, IRON, RETICCTPCT in the last 72 hours. Sepsis Labs: No results for input(s): PROCALCITON, LATICACIDVEN in the last 168  hours.  No results found for this or any previous visit (from the past 240 hours).        Radiology Studies: DG Abd 2 Views Result Date: 09/15/2024 EXAM: 2 VIEW XRAY OF THE ABDOMEN 09/15/2024 07:01:00 PM COMPARISON: Comparison with 09/13/2024. CLINICAL HISTORY: 855384 Pain 244615 Pain. FINDINGS: LINES, TUBES AND DEVICES: Enteric tube with tip projecting over the upper mid-abdomen consistent with location in the body of the stomach. BOWEL: Residual contrast material in the right colon. Mildly dilated gas-filled colon likely due to ileus. Small bowel dilatation seen previously has resolved. No free intraabdominal air. No abnormal air-fluid levels. SOFT TISSUES: Skin clips along the midline consistent with recent surgery. No opaque urinary calculi. BONES: Degenerative changes in the spine and hips. No acute osseous abnormality. LUNG BASES: Atelectasis in the lung bases with probable small pleural effusions. IMPRESSION: 1. Mildly dilated gas-filled colon likely due to ileus. 2. Small bowel dilatation seen previously has resolved. 3. No free intraabdominal air or abnormal air-fluid levels. Electronically signed by: Elsie Gravely MD 09/15/2024 07:07 PM EST RP Workstation: [id number]            LOS: 7 days   Time spent= 40 mins    Deliliah Room, MD [facility]  If 7PM-7AM, please contact night-coverage  09/17/2024, 10:23 AM

## 2024-09-18 DIAGNOSIS — K56609 Unspecified intestinal obstruction, unspecified as to partial versus complete obstruction: Secondary | ICD-10-CM | POA: Diagnosis not present

## 2024-09-18 LAB — MAGNESIUM: Magnesium: 2.3 mg/dL (ref 1.7–2.4)

## 2024-09-18 LAB — GLUCOSE, CAPILLARY
Glucose-Capillary: 115 mg/dL — ABNORMAL HIGH (ref 70–99)
Glucose-Capillary: 118 mg/dL — ABNORMAL HIGH (ref 70–99)
Glucose-Capillary: 120 mg/dL — ABNORMAL HIGH (ref 70–99)
Glucose-Capillary: 122 mg/dL — ABNORMAL HIGH (ref 70–99)
Glucose-Capillary: 123 mg/dL — ABNORMAL HIGH (ref 70–99)
Glucose-Capillary: 123 mg/dL — ABNORMAL HIGH (ref 70–99)

## 2024-09-18 LAB — RENAL FUNCTION PANEL
Albumin: 2.9 g/dL — ABNORMAL LOW (ref 3.5–5.0)
Anion gap: 8 (ref 5–15)
BUN: 14 mg/dL (ref 8–23)
CO2: 27 mmol/L (ref 22–32)
Calcium: 8.9 mg/dL (ref 8.9–10.3)
Chloride: 101 mmol/L (ref 98–111)
Creatinine, Ser: 0.64 mg/dL (ref 0.44–1.00)
GFR, Estimated: >60 mL/min (ref >60)
Glucose, Bld: 126 mg/dL — ABNORMAL HIGH (ref 70–99)
Phosphorus: 4.5 mg/dL (ref 2.5–4.6)
Potassium: 4.3 mmol/L (ref 3.5–5.1)
Sodium: 136 mmol/L (ref 135–145)

## 2024-09-18 MED ORDER — ENSURE PLUS HIGH PROTEIN PO LIQD
237.0000 mL | Freq: Three times a day (TID) | ORAL | Status: DC
  Administered 2024-09-19: 237 mL via ORAL

## 2024-09-18 MED ORDER — TRAVASOL 10 % IV SOLN
INTRAVENOUS | Status: AC
  Filled 2024-09-18: qty 349.9

## 2024-09-18 NOTE — Plan of Care (Signed)

## 2024-09-18 NOTE — Progress Notes (Signed)
 5 Days Post-Op   Subjective/Chief Complaint: DOING WELL  - BM -+flatus   Objective: Vital signs in last 24 hours: Temp:  [98.1 F (36.7 C)-98.9 F (37.2 C)] 98.6 F (37 C) (11/26 0300) Pulse Rate:  [90-92] 91 (11/26 0300) Resp:  [16-18] 16 (11/26 0300) BP: (128-140)/(66-90) 140/74 (11/26 0300) SpO2:  [96 %] 96 % (11/26 0300) Last BM Date : 09/04/24  Intake/Output from previous day: 11/25 0701 - 11/26 0700 In: 875.3 [P.O.:236; I.V.:639.3] Out: -  Intake/Output this shift: Total I/O In: 639.3 [I.V.:639.3] Out: -   General appearance: alert and cooperative GI: sof,t min distention, nttp Incision/Wound: c/d/i  Lab Results:  Recent Labs    09/16/24 1213 09/17/24 0842  WBC 14.9* 9.5  HGB 11.2* 10.8*  HCT 36.2 35.0*  PLT 226 204   BMET Recent Labs    09/17/24 0842 09/18/24 0335  NA 138 136  K 4.2 4.3  CL 100 101  CO2 28 27  GLUCOSE 114* 126*  BUN 15 14  CREATININE 0.52 0.64  CALCIUM  8.8* 8.9   PT/INR No results for input(s): LABPROT, INR in the last 72 hours. ABG No results for input(s): PHART, HCO3 in the last 72 hours.  Invalid input(s): PCO2, PO2  Studies/Results: No results found.  Anti-infectives: Anti-infectives (From admission, onward)    Start     Dose/Rate Route Frequency Ordered Stop   09/13/24 0915  ceFAZolin  (ANCEF ) IVPB 2g/100 mL premix        2 g 200 mL/hr over 30 Minutes Intravenous On call to O.R. 09/13/24 0826 09/13/24 1441       Assessment/Plan: s/p Procedure(s): LAPAROTOMY, EXPLORATORY W/LYSIS OF ADHESIONS (N/A) 11/21 Dr. Aron -  POD 5 -  con't FLD + ensure, ok to 1/2 TPN- await good bowel fxn -  PT/OT -  Start mucinex , I brought her an incentive spirometer, I think if her abdominal pain improves with the above adjustments she will be able to breathe/cough more comfortably.   FEN: FLD, ensure, 1/2 TPN ID: no abx at present; WBC was increased yesterday to 14.9 from 8.0, pending today; if continues to  increase will get a CXR to start leukocytosis workup,  VTE: daily lovenox   Foley: none, spont voids   LOS: 8 days    Jasmine Acosta 09/18/2024

## 2024-09-18 NOTE — Progress Notes (Signed)
 Physical Therapy Treatment Patient Details Name: Jasmine Acosta MRN: [id number] DOB: June 01, 1951 Today's Date: 09/18/2024   History of Present Illness 73 y.o. female admitted 09/10/24 with intractable abdominal pain, workup revealed SBO. S/p ex lap with lysis of adhesions 11/21. PMH includes COPD, HTN, carotid stenosis s/p endarterectomy, migraines.   PT Comments  Pt progressing with mobility. Today's session focused on gait training with rollator, pt moving well with supervision for line management; motivated to participate and encouraged more frequent hallway ambulation with nursing staff. Reviewed education, HEP provided. Will continue to follow acutely to address established goals.     If plan is discharge home, recommend the following: Assistance with cooking/housework;Assist for transportation   Can travel by private vehicle      Yes  Equipment Recommendations  Rollator (4 wheels)    Recommendations for Other Services       Precautions / Restrictions Precautions Precautions: Fall;Other (comment) Recall of Precautions/Restrictions: Intact Precaution/Restrictions Comments: abdominal for comfort Restrictions Weight Bearing Restrictions Per Provider Order: No     Mobility  Bed Mobility               General bed mobility comments: received sitting in recliner    Transfers Overall transfer level: Modified independent Equipment used: Rollator (4 wheels), None Transfers: Sit to/from Stand             General transfer comment: educ on locking/unlocking rollator brakes; mod indep sit<>stand from recliner with and without rollator    Ambulation/Gait Ambulation/Gait assistance: Supervision Gait Distance (Feet): 400 Feet Assistive device: Rollator (4 wheels), None Gait Pattern/deviations: Step-through pattern, Decreased stride length       General Gait Details: slow, steady gait with rollator, supervision for line management; no overt instability or LOB, good  awareness of activity pacing. pt able to walk in room without DME, at times reaching to furniture for added stability   Stairs Stairs:  (pt declines formal stair training, reports no concerns ascending 2 steps into niece's house)           Wheelchair Mobility     Tilt Bed    Modified Rankin (Stroke Patients Only)       Balance Overall balance assessment: Needs assistance Sitting-balance support: No upper extremity supported Sitting balance-Leahy Scale: Good Sitting balance - Comments: indep to don bilateral socks; has been toileting on BSC without assist   Standing balance support: No upper extremity supported Standing balance-Leahy Scale: Fair Standing balance comment: can stand and take steps without UE support, pt reports preference for walker                            Communication Communication Communication: No apparent difficulties  Cognition Arousal: Alert Behavior During Therapy: WFL for tasks assessed/performed   PT - Cognitive impairments: No apparent impairments                         Following commands: Intact      Cueing    Exercises Other Exercises Other Exercises: pt pulling ~750-1065mL on incentive spirometer with initial min cues for technique; reviewed frequency    General Comments General comments (skin integrity, edema, etc.): further educ re: role of acute PT, POC, abdominal precautions for comfort, importance of mobility, generalized BLE strengthening (HEP provided), walking program, potential d/c needs including rollator instead of DME. pt reports no further questions or concerns. encouraged more frequent hallway ambulation with assist from nursing  staff      Pertinent Vitals/Pain Pain Assessment Pain Assessment: Faces Faces Pain Scale: Hurts a little bit Pain Location: abdomen Pain Descriptors / Indicators: Discomfort Pain Intervention(s): Monitored during session, Limited activity within patient's tolerance,  Other (comment) (encouraged bracing/splinting)    Home Living                          Prior Function            PT Goals (current goals can now be found in the care plan section) Progress towards PT goals: Progressing toward goals    Frequency    Min 1X/week      PT Plan      Co-evaluation              AM-PAC PT 6 Clicks Mobility   Outcome Measure  Help needed turning from your back to your side while in a flat bed without using bedrails?: None Help needed moving from lying on your back to sitting on the side of a flat bed without using bedrails?: None Help needed moving to and from a bed to a chair (including a wheelchair)?: None Help needed standing up from a chair using your arms (e.g., wheelchair or bedside chair)?: None Help needed to walk in hospital room?: A Little Help needed climbing 3-5 steps with a railing? : A Little 6 Click Score: 22    End of Session   Activity Tolerance: Patient tolerated treatment well Patient left: in chair;with call bell/phone within reach (received without chair alarm, nursing staff has been letting pt get herself up to [facility]) Nurse Communication: Mobility status PT Visit Diagnosis: Other abnormalities of gait and mobility (R26.89);Muscle weakness (generalized) (M62.81)     Time: 9253-9190 PT Time Calculation (min) (ACUTE ONLY): 23 min  Charges:    $Gait Training: 8-22 mins $Therapeutic Activity: 8-22 mins PT General Charges $$ ACUTE PT VISIT: 1 Visit                      Jasmine Acosta, PT, DPT Acute Rehabilitation Services  Personal: Secure Chat Rehab Office: [phone]  Jasmine Acosta 09/18/2024, 10:56 AM

## 2024-09-18 NOTE — Progress Notes (Signed)
 PROGRESS NOTE Jasmine Acosta  FMW:[id number] DOB: Aug 23, 1951 DOA: 09/10/2024 PCP: Lenon Layman ORN, MD  Brief Narrative/Hospital Course: Jasmine Acosta is a 73 y.o. female with past medical history significant for COPD, HTN, migraine, PVD, carotid artery stenosis s/p endarterectomy, PVD, hypokalemia substance use disorder presented hospital with intractable abdominal pain.  Of note patient was recently a presented to [facility] hospital for abdominal pain nausea and vomiting and subsequently to [facility] ED.  He was given antiemetics and had been discharged home but her symptoms continued to progress show was sent by her primary care provider after obtaining CT scan which showed small bowel obstruction.  In the ED, patient was afebrile but tachycardic.  Labs were notable for hypokalemia with potassium of 3.2 and mild leukocytosis at 12.3.  Urinalysis showed proteinuria.  EKG with normal sinus rhythm.  CT scan of abdomen and pelvis showed severe small bowel obstruction with transition zone in the pelvis.  Patient was given symptomatic care, general surgery was consulted and patient was considered for admission to the hospital for further evaluation and treatment.  Subjective: Seen and examined today On the bedside chair.  Reports passing flatus but no BM, no nausea vomiting and pain is controlled Overnight afebrile on room air BP stable.  Labs reviewed stable electrolytes  Assessment and plan:  Small bowel obstruction: Status post ex lap with LOA 11/21, surgery following on full liquid diet plus Ensure and off TPN. Awaiting return of good bowel function and hopefully wean off TPN, ADDH per general surgery. Continue pulmonary support Mucinex  incentive spirometry  Hypokalemia Hypophosphatemia: Improved. Monitor  Hypertension: BP stable continue as needed meds  COPD: Not in exacerbation continue with DuoNebs bronchodilators  History of peripheral vascular disease with  intermittent claudication Carotid artery stenosis s/p endarterectomy: S/p stenting with CT showing patent stents in both common iliac arteries. Plavix  resumed on 09/18/2024 per [facility].   Vitamin B12 deficiency On vitamin B12 as outpatient.   Mild anemia: Suspect in the setting of acute illness.  Hemoglobin holding 10 to 11 g.  Stable monitor   Severe malnutrition: RD following on TPN and diet as above augment as tolerated Nutrition Problem: Severe Malnutrition Etiology: acute illness Signs/Symptoms: energy intake < or equal to 50% for > or equal to 5 days, percent weight loss, moderate fat depletion, moderate muscle depletion Percent weight loss: 2.2 % Interventions: TPN, MVI  Mobility: PT Orders: Active PT Follow up Rec: No Pt Follow Up11/26/2025 1054   DVT prophylaxis: enoxaparin  (LOVENOX ) injection 40 mg Start: 09/10/24 2130 Code Status:   Code Status: Limited: Do not attempt resuscitation (DNR) -DNR-LIMITED -Do Not Intubate/DNI  Family Communication: plan of care discussed with patient at bedside. Patient status is: Remains hospitalized because of severity of illness Level of care: Med-Surg   Dispo: The patient is from: HOME            Anticipated disposition: TBD Objective: Vitals last 24 hrs: Vitals:   09/17/24 1559 09/17/24 1941 09/18/24 0300 09/18/24 0733  BP: (!) 132/90 128/66 (!) 140/74 (!) 149/62  Pulse: 92 91 91 91  Resp: 18 16 16 19   Temp: 98.1 F (36.7 C) 98.1 F (36.7 C) 98.6 F (37 C) 98.2 F (36.8 C)  TempSrc: Oral Oral Oral Oral  SpO2: 96% 96% 96% 97%  Weight:      Height:        Physical Examination: General exam: alert awake, oriented, older than stated age HEENT:Oral mucosa moist, Ear/Nose WNL grossly Respiratory system: Bilaterally  clear BS,no use of accessory muscle Cardiovascular system: S1 & S2 +, No JVD. Gastrointestinal system: Abdomen soft, surgical site with honeycomb dressing in place with intact staple underneath  bs sluggish, mildly  tender Nervous System: Alert, awake, moving all extremities,and following commands. Extremities: extremities warm, leg edema neg Skin: Warm, no rashes MSK: Normal muscle bulk,tone, power   Medications reviewed:  Scheduled Meds:  acetaminophen   1,000 mg Oral TID   Chlorhexidine  Gluconate Cloth  6 each Topical Daily   clopidogrel   75 mg Oral Q breakfast   enoxaparin  (LOVENOX ) injection  40 mg Subcutaneous Q24H   feeding supplement  237 mL Oral TID BM   guaiFENesin   600 mg Oral BID   ibuprofen   400 mg Oral TID   insulin  aspart  0-9 Units Subcutaneous Q4H   lidocaine   2 patch Transdermal Daily   pantoprazole  (PROTONIX ) IV  40 mg Intravenous Q24H   senna-docusate  1 tablet Oral BID   Continuous Infusions:  TPN ADULT (ION) 60 mL/hr at 09/18/24 0442   TPN ADULT (ION)     Diet: Diet Order             Diet full liquid Fluid consistency: Thin  Diet effective now                  Data Reviewed: I have personally reviewed following labs and imaging studies ( see epic result tab) CBC: Recent Labs  Lab 09/12/24 0401 09/13/24 0411 09/16/24 1213 09/17/24 0842  WBC 9.0 8.0 14.9* 9.5  HGB 12.0 11.7* 11.2* 10.8*  HCT 38.0 36.6 36.2 35.0*  MCV 93.1 92.7 94.8 94.1  PLT 160 177 226 204   CMP: Recent Labs  Lab 09/13/24 0411 09/13/24 1941 09/14/24 0040 09/15/24 0236 09/16/24 0429 09/17/24 0842 09/18/24 0335  NA 142 141 142 141 137 138 136  K 3.3* 3.9 4.3 3.8 4.0 4.2 4.3  CL 104 106 107 104 102 100 101  CO2 28 27 26  32 29 28 27   GLUCOSE 149* 163* 142* 130* 109* 114* 126*  BUN 13 9 9 9 12 15 14   CREATININE 0.70 0.70 0.67 0.55 0.60 0.52 0.64  CALCIUM  8.5* 7.7* 7.8* 8.2* 8.3* 8.8* 8.9  MG 2.1  --  1.8 2.3 2.1  --  2.3  PHOS 2.1* 3.5 4.4 3.3 3.6  --  4.5   GFR: Estimated Creatinine Clearance: 49.4 mL/min (by C-G formula based on SCr of 0.64 mg/dL). Recent Labs  Lab 09/16/24 0429 09/18/24 0335  AST 17  --   ALT 11  --   ALKPHOS 41  --   BILITOT 0.5  --   PROT 5.7*  --    ALBUMIN  2.5* 2.9*   No results for input(s): LIPASE, AMYLASE in the last 168 hours. No results for input(s): AMMONIA in the last 168 hours. Coagulation Profile: No results for input(s): INR, PROTIME in the last 168 hours. Unresulted Labs (From admission, onward)     Start     Ordered   09/19/24 0500  CBC  Daily,   R     Question:  Specimen collection method  Answer:  IV Team=IV Team collect   09/18/24 0859   09/18/24 0859  CBC  ONCE - URGENT,   URGENT       Question:  Specimen collection method  Answer:  IV Team=IV Team collect   09/18/24 0859   09/16/24 0500  Comprehensive metabolic panel  (Standard TPN Labs (all labs to be drawn at 0500))  Every Mon,Thu (0500),   R      09/12/24 1218   09/16/24 0500  Magnesium   (Standard TPN Labs (all labs to be drawn at 0500))  Every Mon,Thu (0500),   R      09/12/24 1218   09/16/24 0500  Phosphorus  (Standard TPN Labs (all labs to be drawn at 0500))  Every Mon,Thu (0500),   R      09/12/24 1218   09/16/24 0500  Triglycerides  (Standard TPN Labs (all labs to be drawn at 0500))  Every Monday (0500),   R      09/12/24 1218           Antimicrobials/Microbiology: Anti-infectives (From admission, onward)    Start     Dose/Rate Route Frequency Ordered Stop   09/13/24 0915  ceFAZolin  (ANCEF ) IVPB 2g/100 mL premix        2 g 200 mL/hr over 30 Minutes Intravenous On call to O.R. 09/13/24 0826 09/13/24 1441         Component Value Date/Time   SDES  09/06/2024 0306    URINE, CLEAN CATCH Performed at [facility], [address] [place], [place] [address]    [facility]  09/06/2024 0306    NONE Performed at [facility], [address] [place], [place] [address]    CULT (A) 09/06/2024 0306    <10,000 COLONIES/mL INSIGNIFICANT GROWTH Performed at [facility], [address], [place], [place] [address]    REPTSTATUS 09/07/2024 FINAL 09/06/2024 9693    Procedures: Procedure(s)  (LRB): LAPAROTOMY, EXPLORATORY W/LYSIS OF ADHESIONS (N/A)   Mennie LAMY, MD [facility] 09/18/2024, 11:33 AM

## 2024-09-18 NOTE — Progress Notes (Signed)
 PHARMACY - TOTAL PARENTERAL NUTRITION CONSULT NOTE   Indication: Small bowel obstruction  Patient Measurements: Height: 5' (152.4 cm) Weight: 58.5 kg (128 lb 14.4 oz) IBW/kg (Calculated) : 45.5 TPN AdjBW (KG): 49.1 Body mass index is 25.17 kg/m. Usual Weight: ~60 kg  Assessment:  73 yo F who presented with abdominal pain, nausea, and vomiting. She was recently admitted 11/11-11/13 w abdominal pain and poor PO intake. She has had continued poor PO intake. She was found to have a small bowel obstruction with transition in the pelvis. Pharmacy was consulted for TPN in setting of prolonged NPO status and small bowel obstruction. High refeeding risk.   Glucose / Insulin : 01/2024 A1c= 5.0 - CBG <180. Used 3 units of SSI in last 24 hours. Electrolytes: Na 136, K 4.3, Ch 101, Ca 8.9 [CoCa 9.78], Phos 4.5 (goal >3), Mg 2.3 (goal >2) Renal: scr 0.63, BUN 14 Hepatic: LFT/Tbili/TG within normal limits, Alb 2.9 Intake / Output; MIVF: 100% of meals consumed in the past 24 hours, UOP 4+ unmeasured  GI Imaging:  11/20 KUB multiple dilated small bowel loops c/w SBO (unchanged from prior)  GI Surgeries / Procedures:  11/21 Exlap, lysis of adhesions  Central access: PICC 09/12/2024  TPN start date: 09/12/2024  Nutritional Goals: Goal TPN rate is 60 mL/hr (provides 69.9 g of protein and 1597 kcals per day)  RD Assessment: Estimated Needs Total Energy Estimated Needs: 1530-1760 Total Protein Estimated Needs: 68-88 g Total Fluid Estimated Needs: 1.5-1.7L/d  Current Nutrition:  Clear liquids  Plan:  weenTPN at half-rate of 30 mL/hr at 1800 - to provide ~50% of needs Electrolytes in TPN: Na 100 mEq/L, K 100 mEq/L, Ca 8 mEq/L, Mg 8 mEq/L, and Phos 20 mmol/L. Cl:Ac 1:1 Add standard MVI and trace elements to TPN Continue Sensitive q4h SSI tonight and adjust as needed  Monitor TPN labs on Mon/Thurs, daily until stable  Thiamine 100 mg x7 days in TPN (D7/7) Monitor diet advancement/toleration  and ability to discontinue TPN.    Benedetta Heath BS, PharmD, BCPS Clinical Pharmacist 09/18/2024 6:57 AM  Contact: [phone] after 3 PM

## 2024-09-18 NOTE — Progress Notes (Signed)
 Nutrition Brief Note   Patient with diet advanced to full liquids. RD to order Ensure and discontinue Boost Breeze, to provide increased calories and protein in supplements. Per surgery note, TPN to half rate as awaiting bowel function to return. Labs and medications reviewed. Continue nutrition interventions as listed below.   INTERVENTION:  TPN to meet 50% of estimated caloric and protein needs. Continue until patient able to sustain >65% of nutritional needs by PO Management per pharmacy Discontinue Boost Breeze, Ensure Plus High Protein po TID, each supplement provides 350 kcal and 20 grams of protein Advance diet per surgery team   Nestora Glatter RD, LDN Registered Dietitian I Please see AMION for contact information

## 2024-09-19 DIAGNOSIS — K56609 Unspecified intestinal obstruction, unspecified as to partial versus complete obstruction: Secondary | ICD-10-CM | POA: Diagnosis not present

## 2024-09-19 LAB — GLUCOSE, CAPILLARY
Glucose-Capillary: 104 mg/dL — ABNORMAL HIGH (ref 70–99)
Glucose-Capillary: 106 mg/dL — ABNORMAL HIGH (ref 70–99)
Glucose-Capillary: 116 mg/dL — ABNORMAL HIGH (ref 70–99)
Glucose-Capillary: 117 mg/dL — ABNORMAL HIGH (ref 70–99)
Glucose-Capillary: 118 mg/dL — ABNORMAL HIGH (ref 70–99)
Glucose-Capillary: 121 mg/dL — ABNORMAL HIGH (ref 70–99)
Glucose-Capillary: 122 mg/dL — ABNORMAL HIGH (ref 70–99)

## 2024-09-19 LAB — COMPREHENSIVE METABOLIC PANEL WITH GFR
ALT: 17 U/L (ref 0–44)
AST: 23 U/L (ref 15–41)
Albumin: 2.9 g/dL — ABNORMAL LOW (ref 3.5–5.0)
Alkaline Phosphatase: 46 U/L (ref 38–126)
Anion gap: 10 (ref 5–15)
BUN: 10 mg/dL (ref 8–23)
CO2: 26 mmol/L (ref 22–32)
Calcium: 8.8 mg/dL — ABNORMAL LOW (ref 8.9–10.3)
Chloride: 102 mmol/L (ref 98–111)
Creatinine, Ser: 0.58 mg/dL (ref 0.44–1.00)
GFR, Estimated: >60 mL/min (ref >60)
Glucose, Bld: 120 mg/dL — ABNORMAL HIGH (ref 70–99)
Potassium: 3.7 mmol/L (ref 3.5–5.1)
Sodium: 138 mmol/L (ref 135–145)
Total Bilirubin: 0.5 mg/dL (ref 0.0–1.2)
Total Protein: 6 g/dL — ABNORMAL LOW (ref 6.5–8.1)

## 2024-09-19 LAB — CBC
HCT: 33.4 % — ABNORMAL LOW (ref 36.0–46.0)
Hemoglobin: 10.4 g/dL — ABNORMAL LOW (ref 12.0–15.0)
MCH: 29.5 pg (ref 26.0–34.0)
MCHC: 31.1 g/dL (ref 30.0–36.0)
MCV: 94.6 fL (ref 80.0–100.0)
Platelets: 257 K/uL (ref 150–400)
RBC: 3.53 MIL/uL — ABNORMAL LOW (ref 3.87–5.11)
RDW: 13 % (ref 11.5–15.5)
WBC: 8.1 K/uL (ref 4.0–10.5)
nRBC: 0 % (ref 0.0–0.2)

## 2024-09-19 LAB — MAGNESIUM: Magnesium: 2.1 mg/dL (ref 1.7–2.4)

## 2024-09-19 LAB — PHOSPHORUS: Phosphorus: 4.2 mg/dL (ref 2.5–4.6)

## 2024-09-19 MED ORDER — IBUPROFEN 200 MG PO TABS
400.0000 mg | ORAL_TABLET | Freq: Four times a day (QID) | ORAL | Status: DC | PRN
  Filled 2024-09-19: qty 2

## 2024-09-19 MED ORDER — POTASSIUM CHLORIDE 10 MEQ/50ML IV SOLN
10.0000 meq | INTRAVENOUS | Status: AC
  Administered 2024-09-19 (×4): 10 meq via INTRAVENOUS
  Filled 2024-09-19 (×4): qty 50

## 2024-09-19 MED ORDER — TRAVASOL 10 % IV SOLN: INTRAVENOUS | Status: DC

## 2024-09-19 MED ORDER — TRAVASOL 10 % IV SOLN
INTRAVENOUS | Status: DC
  Filled 2024-09-19: qty 349.92

## 2024-09-19 NOTE — Progress Notes (Signed)
 6 Days Post-Op   Subjective/Chief Complaint: Feeling well today.  Reports 3 bowel movements and ongoing flatus.  Tolerating diet.  Denies abdominal pain or nausea.   Objective: Vital signs in last 24 hours: Temp:  [97.6 F (36.4 C)-98.5 F (36.9 C)] 98.1 F (36.7 C) (11/27 0726) Pulse Rate:  [93-100] 100 (11/27 0726) Resp:  [18-19] 19 (11/27 0726) BP: (133-134)/(64-103) 133/64 (11/27 0726) SpO2:  [96 %-97 %] 96 % (11/27 0726) Last BM Date : 09/19/24  Intake/Output from previous day: 11/26 0701 - 11/27 0700 In: 781.8 [I.V.:781.8] Out: -  Intake/Output this shift: No intake/output data recorded.  General appearance: alert and cooperative GI: soft min distention, nttp Incision/Wound: c/d/i  Lab Results:  Recent Labs    09/17/24 0842 09/19/24 0214  WBC 9.5 8.1  HGB 10.8* 10.4*  HCT 35.0* 33.4*  PLT 204 257   BMET Recent Labs    09/18/24 0335 09/19/24 0214  NA 136 138  K 4.3 3.7  CL 101 102  CO2 27 26  GLUCOSE 126* 120*  BUN 14 10  CREATININE 0.64 0.58  CALCIUM  8.9 8.8*   PT/INR No results for input(s): LABPROT, INR in the last 72 hours. ABG No results for input(s): PHART, HCO3 in the last 72 hours.  Invalid input(s): PCO2, PO2  Studies/Results: No results found.  Anti-infectives: Anti-infectives (From admission, onward)    Start     Dose/Rate Route Frequency Ordered Stop   09/13/24 0915  ceFAZolin  (ANCEF ) IVPB 2g/100 mL premix        2 g 200 mL/hr over 30 Minutes Intravenous On call to O.R. 09/13/24 0826 09/13/24 1441       Assessment/Plan: s/p Procedure(s): LAPAROTOMY, EXPLORATORY W/LYSIS OF ADHESIONS (N/A) 11/21 Dr. Aron -  POD 6 - Soft diet, discontinue TPN -  PT/OT - Potential discharge this evening versus tomorrow   FEN: Soft, Ensure, okay to stop TPN ID: no abx at present; WBC normal, afebrile VTE: daily lovenox   Foley: none, spont voids    LOS: 9 days    Jasmine Acosta 09/19/2024

## 2024-09-19 NOTE — Plan of Care (Signed)

## 2024-09-19 NOTE — Progress Notes (Addendum)
 Mobility Specialist: Progress Note   09/19/24 1200  Mobility  Activity Ambulated with assistance  Level of Assistance Standby assist, set-up cues, supervision of patient - no hands on  Assistive Device Front wheel walker  Distance Ambulated (ft) 300 ft  Activity Response Tolerated well  Mobility Referral Yes  Mobility visit 1 Mobility  Mobility Specialist Start Time (ACUTE ONLY) 1155  Mobility Specialist Stop Time (ACUTE ONLY) 1201  Mobility Specialist Time Calculation (min) (ACUTE ONLY) 6 min    Pt received in chair, agreeable to mobility session. SV throughout. No complaints. SpO2 89-93% on RA, HR 106-107. Denies any dizziness, lightheadedness, or SOB. Returned to room. Able to ambulated in the room without AD. Left in chair with all needs met, call bell in reach.   Ileana Lute Mobility Specialist Please contact via SecureChat or Rehab office at [phone]

## 2024-09-19 NOTE — Progress Notes (Signed)
  Progress Note   Patient: Jasmine Acosta FMW:[id number] DOB: September 14, 1951 DOA: 09/10/2024     9 DOS: the patient was seen and examined on 09/19/2024        Brief hospital course: 73 y.o. F with COPD, HTN, migraine, carotid artery stenosis s/p CEA, who presented with SBO.  Underwent LOA on 11/21.           Assessment and plan: Small bowel obstruction Severe malnutrition Having bowel movements and passing flatus.  Abdominal pain is resolved, distention is resolved. - Diet per surgery - TPN per surgery - Postop care per surgery - From a medical standpoint, patient has no medical barriers to discharge once cleared for discharge by general surgery     History of peripheral vascular disease with intermittent claudication Hypertension Carotid artery stenosis s/p endarterectomy Asymptomatic Blood pressure normal - Continue Plavix  - Resume Crestor  - Hold amlodipine   COPD No evidence of flare, not on home oxygen or maintenance inhaler  Vitamin B12 deficiency -Resume home B12 at discharge  Normocytic anemia Hemoglobin remained stable, no clinical bleeding reported -Resume home B12 and iron at discharge  Hypokalemia Hypophosphatemia -Supplement electrolytes             Subjective: Patient feels well, she is having bowel movements, she is ambulating with therapy, she is less abdominal distention, she has no significant abdominal pain     Physical Exam: BP 133/64 (BP Location: Left Arm)   Pulse 100   Temp 98.1 F (36.7 C) (Oral)   Resp 19   Ht 5' (1.524 m)   Wt 58.5 kg   SpO2 96%   BMI 25.17 kg/m   Thin adult female, sitting up in recliner, interactive and appropriate RRR, no murmurs, no peripheral edema Respiratory rate normal, lungs clear without rales or wheezes Abdomen soft, mild guarding and tenderness palpation diffusely, no ascites or distention Attention normal, affect normal, judgment and insight appear normal, upper extremity strength  symmetric and normal, face symmetric, speech fluent    Data Reviewed: Basic metabolic panel shows normal electrolytes and renal function CBC shows mild anemia    Family Communication:     Disposition: Status is: Inpatient         Author: Lonni SHAUNNA Dalton, MD 09/19/2024 8:23 AM  For on call review [email or web address].

## 2024-09-19 NOTE — Progress Notes (Deleted)
 PHARMACY - TOTAL PARENTERAL NUTRITION CONSULT NOTE   Indication: Small bowel obstruction  Patient Measurements: Height: 5' (152.4 cm) Weight: 58.5 kg (128 lb 14.4 oz) IBW/kg (Calculated) : 45.5 TPN AdjBW (KG): 49.1 Body mass index is 25.17 kg/m. Usual Weight: ~60 kg  Assessment:  73 yo F who presented with abdominal pain, nausea, and vomiting. She was recently admitted 11/11-11/13 w abdominal pain and poor PO intake. She has had continued poor PO intake. She was found to have a small bowel obstruction with transition in the pelvis. Pharmacy was consulted for TPN in setting of prolonged NPO status and small bowel obstruction. High refeeding risk.   Glucose / Insulin : 01/2024 A1c= 5.0 - CBG <180. Used 2 units of SSI in last 24 hours. Electrolytes: Na 138, K 3.7, Ch 102, Ca 8.8 [CoCa 9.68], Phos 4.2 (goal >3), Mg 2.1 (goal >2) Renal: scr 0.58, BUN 10 Hepatic: LFT/Tbili/TG within normal limits, Alb 2.9 Intake / Output; MIVF: UOP 4+ unmeasured, 2+ unmeasured stool  GI Imaging:  11/20 KUB multiple dilated small bowel loops c/w SBO (unchanged from prior)  GI Surgeries / Procedures:  11/21 Exlap, lysis of adhesions  Central access: PICC 09/12/2024  TPN start date: 09/12/2024  Nutritional Goals: Goal TPN rate is 60 mL/hr (provides 69.9 g of protein and 1597 kcals per day)  RD Assessment: Estimated Needs Total Energy Estimated Needs: 1530-1760 Total Protein Estimated Needs: 68-88 g Total Fluid Estimated Needs: 1.5-1.7L/d  Current Nutrition:  Clear liquids and TPN  Plan:  Continue TPN at half-rate of 30 mL/hr at 1800 - to provide ~50% of needs Electrolytes in TPN: Na 100 mEq/L, K 100 mEq/L, Ca 8 mEq/L, Mg 8 mEq/L, and Phos 20 mmol/L. Cl:Ac 1:1 Add standard MVI and trace elements to TPN Continue Sensitive q4h SSI tonight and adjust as needed  K runs iv x4 Monitor TPN labs on Mon/Thurs, daily until stable  Monitor diet advancement/toleration and ability to discontinue TPN.     Benedetta Heath BS, PharmD, BCPS Clinical Pharmacist 09/19/2024 6:59 AM  Contact: [phone] after 3 PM

## 2024-09-20 DIAGNOSIS — K56609 Unspecified intestinal obstruction, unspecified as to partial versus complete obstruction: Secondary | ICD-10-CM | POA: Diagnosis not present

## 2024-09-20 LAB — RENAL FUNCTION PANEL
Albumin: 2.9 g/dL — ABNORMAL LOW (ref 3.5–5.0)
Anion gap: 13 (ref 5–15)
BUN: 10 mg/dL (ref 8–23)
CO2: 22 mmol/L (ref 22–32)
Calcium: 8.5 mg/dL — ABNORMAL LOW (ref 8.9–10.3)
Chloride: 102 mmol/L (ref 98–111)
Creatinine, Ser: 0.66 mg/dL (ref 0.44–1.00)
GFR, Estimated: >60 mL/min (ref >60)
Glucose, Bld: 103 mg/dL — ABNORMAL HIGH (ref 70–99)
Phosphorus: 4.4 mg/dL (ref 2.5–4.6)
Potassium: 3.6 mmol/L (ref 3.5–5.1)
Sodium: 137 mmol/L (ref 135–145)

## 2024-09-20 LAB — MAGNESIUM: Magnesium: 2.2 mg/dL (ref 1.7–2.4)

## 2024-09-20 LAB — CBC
HCT: 33.8 % — ABNORMAL LOW (ref 36.0–46.0)
Hemoglobin: 10.6 g/dL — ABNORMAL LOW (ref 12.0–15.0)
MCH: 29.8 pg (ref 26.0–34.0)
MCHC: 31.4 g/dL (ref 30.0–36.0)
MCV: 94.9 fL (ref 80.0–100.0)
Platelets: 277 K/uL (ref 150–400)
RBC: 3.56 MIL/uL — ABNORMAL LOW (ref 3.87–5.11)
RDW: 13.2 % (ref 11.5–15.5)
WBC: 6.7 K/uL (ref 4.0–10.5)
nRBC: 0 % (ref 0.0–0.2)

## 2024-09-20 LAB — HEPATIC FUNCTION PANEL
ALT: 20 U/L (ref 0–44)
AST: 26 U/L (ref 15–41)
Albumin: 2.9 g/dL — ABNORMAL LOW (ref 3.5–5.0)
Alkaline Phosphatase: 49 U/L (ref 38–126)
Bilirubin, Direct: <0.1 mg/dL (ref 0.0–0.2)
Total Bilirubin: 0.6 mg/dL (ref 0.0–1.2)
Total Protein: 6.2 g/dL — ABNORMAL LOW (ref 6.5–8.1)

## 2024-09-20 LAB — GLUCOSE, CAPILLARY
Glucose-Capillary: 102 mg/dL — ABNORMAL HIGH (ref 70–99)
Glucose-Capillary: 110 mg/dL — ABNORMAL HIGH (ref 70–99)

## 2024-09-20 MED ORDER — TRAMADOL HCL 50 MG PO TABS
50.0000 mg | ORAL_TABLET | Freq: Four times a day (QID) | ORAL | 0 refills | Status: AC | PRN
Start: 2024-09-20 — End: 2024-09-23

## 2024-09-20 MED ORDER — DOCUSATE SODIUM 100 MG PO CAPS: 100.0000 mg | ORAL_CAPSULE | Freq: Two times a day (BID) | ORAL | 0 refills | Status: AC

## 2024-09-20 NOTE — Progress Notes (Signed)
 09/20/2024 9:28 AM [facility]  EXPEDITER PROGRESS NOTE  Patient Name: Jasmine Acosta  DOB:Mar 06, 1951 Date of Admission: 09/10/2024  Date of Assessment:09/20/24   -------------------------------------------------------------------------------------------------------------------   Brief clinical summary: SBO 09/10/2024. OR on 09/13/2024 for LOA.  Labs, test, and orders reviewed: yes  30-day Readmission: Yes  Discharge order: Yes  Current discharge plan: home with family support.  If anticipated for next day discharge, barriers to discharge before 11am identified:  Needs PICC line removed prior to discharge.   Intervention provided by [facility] team:  Confirmed order for PICC removal in EPIC and IV team aware of need.   Barrier resolved: yes    -------------------------------------------------------------------------------------------------------------------  [employer] RN Expediter, Sakib Noguez, Corean Lobstein Please contact us  directly via secure chat (search for [facility]) or by calling us  at [phone] [facility]).

## 2024-09-20 NOTE — Plan of Care (Signed)
  Problem: Education: Goal: Knowledge of General Education information will improve Description: Including pain rating scale, medication(s)/side effects and non-pharmacologic comfort measures Outcome: Adequate for Discharge   Problem: Health Behavior/Discharge Planning: Goal: Ability to manage health-related needs will improve Outcome: Adequate for Discharge   Problem: Clinical Measurements: Goal: Ability to maintain clinical measurements within normal limits will improve Outcome: Adequate for Discharge Goal: Will remain free from infection Outcome: Adequate for Discharge Goal: Diagnostic test results will improve Outcome: Adequate for Discharge Goal: Respiratory complications will improve Outcome: Adequate for Discharge Goal: Cardiovascular complication will be avoided Outcome: Adequate for Discharge   Problem: Activity: Goal: Risk for activity intolerance will decrease Outcome: Adequate for Discharge   Problem: Nutrition: Goal: Adequate nutrition will be maintained Outcome: Adequate for Discharge   Problem: Coping: Goal: Level of anxiety will decrease Outcome: Adequate for Discharge   Problem: Elimination: Goal: Will not experience complications related to bowel motility Outcome: Adequate for Discharge Goal: Will not experience complications related to urinary retention Outcome: Adequate for Discharge   Problem: Pain Managment: Goal: General experience of comfort will improve and/or be controlled Outcome: Adequate for Discharge   Problem: Safety: Goal: Ability to remain free from injury will improve Outcome: Adequate for Discharge   Problem: Skin Integrity: Goal: Risk for impaired skin integrity will decrease Outcome: Adequate for Discharge   Problem: Malnutrition  (NI-5.2) Goal: Food and/or nutrient delivery Description: Individualized approach for food/nutrient provision. Outcome: Adequate for Discharge   Problem: Acute Rehab OT Goals (only OT should  resolve) Goal: Pt. Will Perform Grooming Outcome: Adequate for Discharge Goal: Pt. Will Perform Tub/Shower Transfer Outcome: Adequate for Discharge Goal: OT Additional ADL Goal #1 Outcome: Adequate for Discharge Goal: OT Additional ADL Goal #2 Outcome: Adequate for Discharge Goal: OT Additional ADL Goal #3 Outcome: Adequate for Discharge   Problem: Acute Rehab PT Goals(only PT should resolve) Goal: Pt Will Go Supine/Side To Sit Outcome: Adequate for Discharge Goal: Pt Will Go Sit To Supine/Side Outcome: Adequate for Discharge Goal: Patient Will Transfer Sit To/From Stand Outcome: Adequate for Discharge Goal: Pt Will Ambulate Outcome: Adequate for Discharge Goal: Pt Will Go Up/Down Stairs Outcome: Adequate for Discharge

## 2024-09-20 NOTE — Discharge Summary (Signed)
 Physician Discharge Summary   Patient: Jasmine Acosta MRN: [id number] DOB: 1951-08-15  Admit date:     09/10/2024  Discharge date: 09/20/24  Discharge Physician: Lonni SHAUNNA Dalton   PCP: Lenon Layman ORN, MD     Recommendations at discharge:  Follow up with general surgery, Dr. Aron for postop care of lysis of adhesions Follow-up with PCP Dr. Lenon in 1 week Dr. Lenon: Please check blood pressure and resume amlodipine  as appropriate Please follow B12 deficiency as indicated     Discharge Diagnoses: Principal Problem:   Small bowel obstruction (HCC) Active Problems:   Hypokalemia Normocytic anemia Peripheral vascular disease COPD Hypokalemia Hypophosphatemia   Protein-calorie malnutrition, severe     Hospital Course: 73 y.o. F with COPD, HTN, migraine, carotid artery stenosis s/p CEA, who presented with SBO.  Underwent LOA on 11/21.       Small bowel obstruction The patient was admitted, and conservative management was attempted.  She failed to progress, so she was taken for lysis of adhesions on 11/21 by general surgery, Dr. Aron.  Postoperatively, she required TPN for a brief time, but progressed well, was tolerating oral diet and having bowel movements, pain was well-controlled, electrolytes were supplemented, and she was discharged in stable condition, has close general surgery follow-up.    Hypertension Blood pressure normal off medications in setting of surgery.  Amlodipine  held temporarily at discharge, to be resumed by PCP.  Peripheral vascular disease Plavix  resumed on 11/26 postop, and patient did well.  Vitamin B12 deficiency Recently diagnosed, on B12 as an outpatient, continued at discharge.  Anemia No significant change in hemoglobin post-op             The [place]  Controlled Substances Registry was reviewed for this patient prior to discharge.  Consultants: [facility] Procedures performed: Lysis of  adhesions  Disposition: Home   DISCHARGE MEDICATION: Allergies as of 09/20/2024       Reactions   Lisinopril Other (See Comments)   Other reaction(s): Angioedema Of tongue only   Codeine Nausea And Vomiting        Medication List     PAUSE taking these medications    amLODipine  5 MG tablet Wait to take this until your doctor or other care provider tells you to start again. Commonly known as: NORVASC  Take 5 mg by mouth daily.       TAKE these medications    clopidogrel  75 MG tablet Commonly known as: PLAVIX  Take 1 tablet (75 mg total) by mouth daily with breakfast.   cyanocobalamin  1000 MCG tablet Commonly known as: VITAMIN B12 Take 1 tablet (1,000 mcg total) by mouth daily.   docusate sodium  100 MG capsule Commonly known as: Colace Take 1 capsule (100 mg total) by mouth 2 (two) times daily. Okay to decrease to once daily or stop taking if having loose bowel movements   Iron (Ferrous Sulfate) 325 (65 Fe) MG Tabs Take 65 mg/kg of iron by mouth.   multivitamin capsule Take 1 capsule by mouth daily.   pantoprazole  40 MG tablet Commonly known as: PROTONIX  Take 40 mg by mouth daily.   prochlorperazine  10 MG tablet Commonly known as: COMPAZINE  Take 1 tablet (10 mg total) by mouth 2 (two) times daily as needed for nausea or vomiting.   rosuvastatin  40 MG tablet Commonly known as: CRESTOR  Take 40 mg by mouth daily.   traMADol  50 MG tablet Commonly known as: ULTRAM  Take 1-2 tablets (50-100 mg total) by mouth every 6 (six) hours  as needed for up to 3 days for moderate pain (pain score 4-6) or severe pain (pain score 7-10) (Pain not controlled by Tylenol , ibuprofen , ice, rest etc.).               Discharge Care Instructions  (From admission, onward)           Start     Ordered   09/20/24 0000  Discharge wound care:       Comments: As directed by General Surgery   09/20/24 0911            Follow-up Information     Aron Shoulders, MD Follow  up.   Specialty: General Surgery Why: Our office should contact you to set up an appointment for staple removal as well as a follow-up appointment with your surgeon.  If you do not hear from us  by Monday, please call. Contact information: [address] [place] [address] [phone]         Lenon Layman ORN, MD. Schedule an appointment as soon as possible for a visit in 1 week(s).   Specialty: Internal Medicine Contact information: [address] [facility] [place] [address] [phone]                 Discharge Instructions     Discharge instructions   Complete by: As directed    **IMPORTANT DISCHARGE INSTRUCTIONS**   From Dr. Jonel: You were admitted for a bowel obstruction  Here, unfortunately, the obstruction didn't resolve by itself like they usually do, so you had to have surgery to fix it, by Dr. Aron Schiff, after the surgery you did very well.  See below in the after visit summary for postop instructions from the general surgeons.  Dietary instructions are at #4 (eat a light diet for the next few days, then after that you may resume a high-fiber low-fat diet)  Go see Dr. Aron as directed, call her office if you need to confirm the date and time  Resume your normal home medicines EXCEPT for the next week, do not take your blood pressure medicine amlodipine   Go see your primary doctor in 1 week and ask them if you should resume it   Discharge wound care:   Complete by: As directed    As directed by General Surgery   Increase activity slowly   Complete by: As directed        Discharge Exam: Filed Weights   09/15/24 0442 09/16/24 0410 09/17/24 0500  Weight: 60.4 kg 60.5 kg 58.5 kg    General: Pt is alert, awake, not in acute distress Cardiovascular: RRR, nl S1-S2, no murmurs appreciated.   No LE edema.   Respiratory: Normal respiratory rate and rhythm.  CTAB without rales or  wheezes. Abdominal: Incisiion clean dry and intact Neuro/Psych: Strength symmetric in upper and lower extremities.  Judgment and insight appear normal.   Condition at discharge: good  The results of significant diagnostics from this hospitalization (including imaging, microbiology, ancillary and laboratory) are listed below for reference.   Imaging Studies: DG Abd 2 Views Result Date: 09/15/2024 EXAM: 2 VIEW XRAY OF THE ABDOMEN 09/15/2024 07:01:00 PM COMPARISON: Comparison with 09/13/2024. CLINICAL HISTORY: [id number] Pain [id number] Pain. FINDINGS: LINES, TUBES AND DEVICES: Enteric tube with tip projecting over the upper mid-abdomen consistent with location in the body of the stomach. BOWEL: Residual contrast material in the right colon. Mildly dilated gas-filled colon likely due to ileus. Small bowel  dilatation seen previously has resolved. No free intraabdominal air. No abnormal air-fluid levels. SOFT TISSUES: Skin clips along the midline consistent with recent surgery. No opaque urinary calculi. BONES: Degenerative changes in the spine and hips. No acute osseous abnormality. LUNG BASES: Atelectasis in the lung bases with probable small pleural effusions. IMPRESSION: 1. Mildly dilated gas-filled colon likely due to ileus. 2. Small bowel dilatation seen previously has resolved. 3. No free intraabdominal air or abnormal air-fluid levels. Electronically signed by: Elsie Gravely MD 09/15/2024 07:07 PM EST RP Workstation: [id number]   DG Abd Portable 1V Result Date: 09/13/2024 EXAM: 1 VIEW XRAY OF THE ABDOMEN 09/13/2024 06:30:00 AM COMPARISON: 09/13/2023 CLINICAL HISTORY: SBO (small bowel obstruction) (HCC) FINDINGS: LINES, TUBES AND DEVICES: Enteric tube terminates in the stomach. Bilateral common iliac artery stents are noted. BOWEL: Multiple dilated loops of small bowel, up to 4.4 cm, are again seen in the abdomen, overall similar to the prior study. Contrast material opacifies these small bowel loops  without oral contrast material advancing into the colon. SOFT TISSUES: Vascular calcifications are noted. No opaque urinary calculi. BONES: No acute osseous abnormality. IMPRESSION: 1. Small bowel obstruction, overall similar to the prior study, with multiple dilated loops of small bowel up to 4.4 cm and no oral contrast material advancing into the colon. Electronically signed by: Waddell Calk MD 09/13/2024 08:47 AM EST RP Workstation: [id number]   DG Abd Portable 1V-Small Bowel Obstruction Protocol-initial, 8 hr delay Result Date: 09/12/2024 EXAM: 1 VIEW XRAY OF THE ABDOMEN 09/12/2024 09:25:00 PM COMPARISON: 09/12/2024 CLINICAL HISTORY: FINDINGS: LINES, TUBES AND DEVICES: Enteric tube stable in place, side hole at the level of the proximal stomach. Bilateral common iliac artery stents noted. BOWEL: Multiple loops of dilated small bowel were again identified throughout the abdomen in keeping with changes of the distal small bowel obstruction. Administered oral contrast is seen within several distal loops of small bowel, however, contrast has not yet advanced into the colon. SOFT TISSUES: No opaque urinary calculi. BONES: No acute osseous abnormality. VASCULATURE: Aortoiliac calcified atherosclerosis. IMPRESSION: 1. Distal small bowel obstruction with oral contrast not yet advanced into the colon. Electronically signed by: Dorethia Molt MD 09/12/2024 10:48 PM EST RP Workstation: [id number]   US  EKG SITE RITE Result Date: 09/12/2024 If Site Rite image not attached, placement could not be confirmed due to current cardiac rhythm.  DG Abd Portable 1V-Small Bowel Obstruction Protocol-24 hr delay Result Date: 09/12/2024 EXAM: 1 VIEW XRAY OF THE ABDOMEN 09/12/2024 06:30:00 AM COMPARISON: 09/11/2024 CLINICAL HISTORY: SBO (small bowel obstruction) (HCC) FINDINGS: LINES, TUBES AND DEVICES: Enteric tube in place with distal tip and side port terminating within the expected location of the gastric fundus and body.  Bilateral common iliac artery stents present. BOWEL: Multiple dilated small bowel loops unchanged. SOFT TISSUES: No opaque urinary calculi. BONES: No acute osseous abnormality. IMPRESSION: 1. Multiple dilated small bowel loops consistent with small bowel obstruction, unchanged from prior examination. Electronically signed by: Waddell Calk MD 09/12/2024 07:26 AM EST RP Workstation: [id number]   DG Abd Portable 1V-Small Bowel Obstruction Protocol-initial, 8 hr delay Result Date: 09/11/2024 CLINICAL DATA:  Small bowel obstruction. EXAM: PORTABLE ABDOMEN - 1 VIEW COMPARISON:  September 11, 2024 (1:10 a.m.) FINDINGS: There is stable nasogastric tube positioning with its distal tip seen within the body of the stomach. Numerous dilated loops of air-filled small bowel are seen throughout the abdomen. This is unchanged severity when compared to the prior study. No radio-opaque calculi or other significant radiographic abnormality are seen. IMPRESSION: 1.  Stable nasogastric tube positioning. 2. Stable small bowel obstruction. Electronically Signed   By: Suzen Dials M.D.   On: 09/11/2024 19:35   DG Abd Portable 1 View Result Date: 09/11/2024 EXAM: 1 VIEW XRAY OF THE ABDOMEN 09/11/2024 01:15:02 AM COMPARISON: 09/10/2024 CLINICAL HISTORY: NG placement. FINDINGS: LINES, TUBES AND DEVICES: Enteric tube advanced with tip in the stomach. BOWEL: Dilated small bowel loops in abdomen. SOFT TISSUES: No opaque urinary calculi. BONES: No acute osseous abnormality. IMPRESSION: 1. Dilated small bowel loops, concerning for small bowel obstruction. 2. Enteric tube in good position. Electronically signed by: Morgane Naveau MD 09/11/2024 01:24 AM EST RP Workstation: [id number]   DG Abd Portable 1 View Result Date: 09/10/2024 CLINICAL DATA:  NG tube EXAM: PORTABLE ABDOMEN - 1 VIEW COMPARISON:  Mixed field FINDINGS: nasogastric tube tip is looped on itself with tip projecting cranially. The tube is located in the distal  esophagus. There are multiple air-filled dilated small bowel compatible with small-bowel obstruction similar IMPRESSION: Nasogastric tube tip is looped on itself with tip projecting cranially. The tube is located in the distal esophagus. Recommend repositioning. Electronically Signed   By: Greig Pique M.D.   On: 09/10/2024 23:40   CT Angio Abd/Pel w/ and/or w/o Result Date: 09/10/2024 EXAM: CTA ABDOMEN AND PELVIS WITHOUT AND WITH CONTRAST 09/10/2024 02:07:41 PM TECHNIQUE: CTA images of the abdomen and pelvis without and with intravenous contrast. iohexol  (OMNIPAQUE ) 350 MG/ML injection 100 mL IOHEXOL  350 MG/ML SOLN was administered. Three-dimensional MIP/volume rendered formations were performed. Automated exposure control, iterative reconstruction, and/or weight based adjustment of the mA/kV was utilized to reduce the radiation dose to as low as reasonably achievable. COMPARISON: 09/03/2024 CLINICAL HISTORY: FINDINGS: VASCULATURE: AORTA: Aortic atherosclerosis. No abdominal aortic aneurysm. No dissection. CELIAC TRUNK: No acute finding. No occlusion or significant stenosis. SUPERIOR MESENTERIC ARTERY: No acute finding. No occlusion or significant stenosis. RENAL ARTERIES: Moderate narrowing noted at origin of right renal artery secondary to calcified plaque. The left renal artery demonstrates no acute finding, occlusion, or significant stenosis. ILIAC ARTERIES: Patent stents in both common iliac arteries. Moderate narrowing is seen involving both common femoral artery secondary to calcified plaque. LIVER: The liver is unremarkable. GALLBLADDER AND BILE DUCTS: Gallbladder is unremarkable. No biliary ductal dilatation. SPLEEN: The spleen is unremarkable. PANCREAS: The pancreas is unremarkable. ADRENAL GLANDS: Bilateral adrenal glands demonstrate no acute abnormality. KIDNEYS, URETERS AND BLADDER: No stones in the kidneys or ureters. No hydronephrosis. No perinephric or periureteral stranding. Urinary  bladder is unremarkable. GI AND BOWEL: Severe small bowel dilatation is noted with transitioning zone seen in the pelvis, seen on image 68, series 14. This is concerning for possible adhesion. No colonic dilatation is noted. Stomach and duodenal sweep demonstrate no acute abnormality. No abnormal bowel wall thickening or distension. REPRODUCTIVE: Status post hysterectomy. PERITONEUM AND RETRPERITONEUM: Mild amount of free fluid is noted in the pelvis. No free air. LUNG BASE: No acute abnormality. LYMPH NODES: No lymphadenopathy. BONES AND SOFT TISSUES: No acute abnormality of the bones. No acute soft tissue abnormality. IMPRESSION: 1. Severe small bowel obstruction with a transition zone in the pelvis, concerning for adhesion. 2. Moderate atherosclerotic narrowing at the origin of the right renal artery. 3. Moderate atherosclerotic narrowing involving both common femoral arteries. 4. Patent stents in both common iliac arteries. Electronically signed by: Lynwood Seip MD 09/10/2024 02:42 PM EST RP Workstation: [id number]   DG Chest Portable 1 View Result Date: 09/06/2024 EXAM: 1 VIEW(S) XRAY OF THE CHEST 09/06/2024 11:11:35 AM COMPARISON: None  available. CLINICAL HISTORY: hypoxia, n/v FINDINGS: LUNGS AND PLEURA: Streaky bibasilar atelectasis. No pleural effusion. No pneumothorax. HEART AND MEDIASTINUM: Aortic atherosclerosis. BONES AND SOFT TISSUES: No acute osseous abnormality. Multilevel thoracic osteophytosis. IMPRESSION: 1. No acute cardiopulmonary abnormality. Electronically signed by: Rogelia Myers MD 09/06/2024 11:26 AM EST RP Workstation: [id number]   US  Abdomen Limited RUQ (LIVER/GB) Result Date: 09/04/2024 EXAM: Right Upper Quadrant Abdominal Ultrasound 09/03/2024 11:35:00 PM TECHNIQUE: Real-time ultrasonography of the right upper quadrant of the abdomen was performed. COMPARISON: CT abdomen and pelvis earlier today. CLINICAL HISTORY: Right upper quadrant abdominal pain. FINDINGS: LIVER: The liver  demonstrates normal echogenicity. No intrahepatic biliary ductal dilatation. No evidence of mass. BILIARY SYSTEM: No pericholecystic fluid or wall thickening. No cholelithiasis. Negative sonographic Murphy's sign. Common bile duct is within normal limits measuring 5 mm. OTHER: No right upper quadrant ascites. IMPRESSION: 1. No acute findings. Electronically signed by: Pinkie Pebbles MD 09/04/2024 12:16 AM EST RP Workstation: [id number]   CT ABDOMEN PELVIS W CONTRAST Result Date: 09/03/2024 EXAM: CT ABDOMEN AND PELVIS WITH CONTRAST 09/03/2024 06:51:48 PM TECHNIQUE: CT of the abdomen and pelvis was performed with the administration of 100 mL of iohexol  (OMNIPAQUE ) 300 MG/ML solution. Multiplanar reformatted images are provided for review. Automated exposure control, iterative reconstruction, and/or weight-based adjustment of the mA/kV was utilized to reduce the radiation dose to as low as reasonably achievable. COMPARISON: 05/16/2022. CLINICAL HISTORY: Abdominal pain, acute, nonlocalized. FINDINGS: LOWER CHEST: No acute abnormality. LIVER: The liver is unremarkable. GALLBLADDER AND BILE DUCTS: Gallbladder is unremarkable. No biliary ductal dilatation. SPLEEN: No acute abnormality. PANCREAS: No acute abnormality. ADRENAL GLANDS: No acute abnormality. KIDNEYS, URETERS AND BLADDER: No stones in the kidneys or ureters. No hydronephrosis. No perinephric or periureteral stranding. Urinary bladder is unremarkable. GI AND BOWEL: Stomach demonstrates no acute abnormality. There is no bowel obstruction. Normal appendix (image 49). PERITONEUM AND RETROPERITONEUM: Small amount of free fluid in dependent pelvis. No free air. VASCULATURE: Atherosclerotic calcifications of the abdominal aorta and branch vessels, although patent. LYMPH NODES: No lymphadenopathy. REPRODUCTIVE ORGANS: Surgically absent uterus. BONES AND SOFT TISSUES: Mild multilevel degenerative changes in visualized spine. No focal soft tissue abnormality.  IMPRESSION: 1. No acute findings in the abdomen or pelvis. Electronically signed by: Pinkie Pebbles MD 09/03/2024 07:01 PM EST RP Workstation: [id number]    Microbiology: Results for orders placed or performed during the hospital encounter of 09/06/24  Urine Culture     Status: Abnormal   Collection Time: 09/06/24  3:06 AM   Specimen: Urine, Clean Catch  Result Value Ref Range Status   Specimen Description   Final    URINE, CLEAN CATCH Performed at [facility], [address] [place], [place] [address]    Special Requests   Final    NONE Performed at [facility], [address], [place], [place] [address]    Culture (A)  Final    <10,000 COLONIES/mL INSIGNIFICANT GROWTH Performed at [facility], [address] [place], [place] [address]    Report Status 09/07/2024 FINAL  Final    Labs: CBC: Recent Labs  Lab 09/16/24 1213 09/17/24 0842 09/19/24 0214 09/20/24 0311  WBC 14.9* 9.5 8.1 6.7  HGB 11.2* 10.8* 10.4* 10.6*  HCT 36.2 35.0* 33.4* 33.8*  MCV 94.8 94.1 94.6 94.9  PLT 226 204 257 277   Basic Metabolic Panel: Recent Labs  Lab 09/15/24 0236 09/16/24 0429 09/17/24 0842 09/18/24 0335 09/19/24 0214 09/20/24 0311  NA 141 137 138 136 138 137  K  3.8 4.0 4.2 4.3 3.7 3.6  CL 104 102 100 101 102 102  CO2 32 29 28 27 26 22   GLUCOSE 130* 109* 114* 126* 120* 103*  BUN 9 12 15 14 10 10   CREATININE 0.55 0.60 0.52 0.64 0.58 0.66  CALCIUM  8.2* 8.3* 8.8* 8.9 8.8* 8.5*  MG 2.3 2.1  --  2.3 2.1 2.2  PHOS 3.3 3.6  --  4.5 4.2 4.4   Liver Function Tests: Recent Labs  Lab 09/16/24 0429 09/18/24 0335 09/19/24 0214 09/20/24 0311  AST 17  --  23 26  ALT 11  --  17 20  ALKPHOS 41  --  46 49  BILITOT 0.5  --  0.5 0.6  PROT 5.7*  --  6.0* 6.2*  ALBUMIN  2.5* 2.9* 2.9* 2.9*  2.9*   CBG: Recent Labs  Lab 09/19/24 1547 09/19/24 2046 09/19/24 2338 09/20/24 0332 09/20/24 0803  GLUCAP 117* 104* 106* 102* 110*     Discharge time spent: approximately 25 minutes spent on discharge counseling, evaluation of patient on day of discharge, and coordination of discharge planning with nursing, social work, pharmacy and case management  Signed: Lonni SHAUNNA Dalton, MD [facility] 09/20/2024

## 2024-09-20 NOTE — Discharge Instructions (Addendum)
 [facility]      [facility], [place] [phone]  OPEN ABDOMINAL SURGERY: POST OP INSTRUCTIONS  Always review your discharge instruction sheet given to you by the facility where your surgery was performed.  IF YOU HAVE DISABILITY OR FAMILY LEAVE FORMS, YOU MUST BRING THEM TO THE OFFICE FOR PROCESSING.  PLEASE DO NOT GIVE THEM TO YOUR DOCTOR.  A prescription for pain medication may be given to you upon discharge.  Take your pain medication as prescribed, if needed.  If narcotic pain medicine is not needed, then you may take acetaminophen  (Tylenol ) or ibuprofen  (Advil ) as needed. Take your usually prescribed medications unless otherwise directed. If you need a refill on your pain medication, please contact your pharmacy. They will contact our office to request authorization.  Prescriptions will not be filled after 5pm or on week-ends. You should follow a light diet the first few days after arrival home, such as soup and crackers, pudding, etc.unless your doctor has advised otherwise. A high-fiber, low fat diet can be resumed as tolerated.   Be sure to include lots of fluids daily. Most patients will experience some swelling and bruising on the chest and neck area.  Ice packs will help.  Swelling and bruising can take several days to resolve Most patients will experience some swelling and bruising in the area of the incision. Ice pack will help. Swelling and bruising can take several days to resolve..  It is common to experience some constipation if taking pain medication after surgery.  Increasing fluid intake and taking a stool softener will usually help or prevent this problem from occurring.  A mild laxative (Milk of Magnesia or Miralax ) should be taken according to package directions if there are no bowel movements after 48 hours.   Any sutures or staples will be removed at the office during your follow-up visit. You may find that a light gauze bandage over your incision may keep your staples from  being rubbed or pulled. You may shower and replace the bandage daily. ACTIVITIES:  You may resume regular (light) daily activities beginning the next day--such as daily self-care, walking, climbing stairs--gradually increasing activities as tolerated.  You may have sexual intercourse when it is comfortable.  Refrain from any heavy lifting or straining until approved by your doctor. You may drive when you no longer are taking prescription pain medication, you can comfortably wear a seatbelt, and you can safely maneuver your car and apply brakes Return to Work: ___________________________________ Jasmine Acosta should see your doctor in the office for a follow-up appointment approximately two weeks after your surgery.  Make sure that you call for this appointment within a day or two after you arrive home to insure a convenient appointment time. OTHER INSTRUCTIONS:  _____________________________________________________________ _____________________________________________________________  WHEN TO CALL YOUR DOCTOR: Fever over 101.0 Inability to urinate Nausea and/or vomiting Extreme swelling or bruising Continued bleeding from incision. Increased pain, redness, or drainage from the incision. Difficulty swallowing or breathing Muscle cramping or spasms. Numbness or tingling in hands or feet or around lips.  The clinic staff is available to answer your questions during regular business hours.  Please don't hesitate to call and ask to speak to one of the nurses if you have concerns.  For further questions, please visit [email or web address]

## 2024-09-20 NOTE — Progress Notes (Signed)
 7 Days Post-Op   Subjective/Chief Complaint: Continues to feel well.  Tolerating a diet, no nausea, no pain, continues to have bowel movements.   Objective: Vital signs in last 24 hours: Temp:  [98.1 F (36.7 C)-98.9 F (37.2 C)] 98.2 F (36.8 C) (11/28 0804) Pulse Rate:  [89-102] 98 (11/28 0804) Resp:  [18-19] 18 (11/28 0804) BP: (129-136)/(68-75) 136/68 (11/28 0804) SpO2:  [91 %-97 %] 97 % (11/28 0804) Last BM Date : 09/19/24  Intake/Output from previous day: 11/27 0701 - 11/28 0700 In: 281.6 [I.V.:281.6] Out: -  Intake/Output this shift: No intake/output data recorded.  General appearance: alert and cooperative GI: soft no distention, nttp Incision/Wound: c/d/i  Lab Results:  Recent Labs    09/19/24 0214 09/20/24 0311  WBC 8.1 6.7  HGB 10.4* 10.6*  HCT 33.4* 33.8*  PLT 257 277   BMET Recent Labs    09/19/24 0214 09/20/24 0311  NA 138 137  K 3.7 3.6  CL 102 102  CO2 26 22  GLUCOSE 120* 103*  BUN 10 10  CREATININE 0.58 0.66  CALCIUM  8.8* 8.5*   PT/INR No results for input(s): LABPROT, INR in the last 72 hours. ABG No results for input(s): PHART, HCO3 in the last 72 hours.  Invalid input(s): PCO2, PO2  Studies/Results: No results found.  Anti-infectives: Anti-infectives (From admission, onward)    Start     Dose/Rate Route Frequency Ordered Stop   09/13/24 0915  ceFAZolin  (ANCEF ) IVPB 2g/100 mL premix        2 g 200 mL/hr over 30 Minutes Intravenous On call to O.R. 09/13/24 0826 09/13/24 1441       Assessment/Plan: s/p Procedure(s): LAPAROTOMY, EXPLORATORY W/LYSIS OF ADHESIONS (N/A) 11/21 Dr. Aron -  POD 7 - Soft diet -  PT/OT  Okay for discharge from surgery standpoint   FEN: Soft, Ensure, okay to stop TPN ID: no abx at present; WBC normal, afebrile VTE: daily lovenox   Foley: none, spont voids    LOS: 10 days    Jasmine Acosta 09/20/2024

## 2024-09-20 NOTE — TOC Transition Note (Signed)
 Transition of Care [facility]) - Discharge Note   Patient Details  Name: Jasmine Acosta MRN: [id number] Date of Birth: 11/16/1950  [facility] CM/SW Contact:  Andrez JULIANNA George, RN Phone Number: 09/20/2024, 9:30 AM   Clinical Narrative:       Final next level of care: Home/Self Care Barriers to Discharge: No Barriers Identified   Patient Goals and CMS Choice      Pt is discharging to Niece's home. No needs per CM.      Discharge Placement                       Discharge Plan and Services Additional resources added to the After Visit Summary for                                       Social Drivers of Health (SDOH) Interventions SDOH Screenings   Food Insecurity: Patient Declined (09/11/2024)  Housing: Unknown (09/11/2024)  Transportation Needs: Patient Declined (09/11/2024)  Utilities: Patient Declined (09/11/2024)  Financial Resource Strain: Low Risk  (02/13/2024)   Received from [facility]  Social Connections: Patient Declined (09/11/2024)  Tobacco Use: Medium Risk (09/13/2024)     Readmission Risk Interventions     No data to display

## 2024-10-01 NOTE — Progress Notes (Signed)
 History of Present Illness:   Jasmine Acosta is a 73 y.o. female here for   Verbally consented to the use of AI for note-taking.   Chief Complaint  Patient presents with   hospital follow up      History of Present Illness Jasmine Acosta is a 73 year old female who presents for hospital follow-up after surgery for small bowel obstruction.  She was admitted to the hospital from November 18th through November 21st due to severe abdominal pain and vomiting. A CT scan revealed a severe small bowel obstruction, and she underwent surgery for lysis of adhesions on November 21st after failing conservative treatment. Post-surgery, she required total parenteral nutrition (TPN) but progressed well and was discharged stably. An abdominal x-ray was performed on November 23rd. Currently, she has no abdominal pain or vomiting, though she notes some discomfort when getting up.  Her amlodipine  was temporarily discontinued during her hospital stay. She has restarted Plavix  for her peripheral vascular disease. She reports that her blood pressure was good at previous visits and she has a way to monitor it at home.  She takes B12 supplements daily due to low levels. She has a history of low hemoglobin and previous iron infusions. Her calcium  and hemoglobin were low at discharge, and she is having these rechecked today.  She recalls a previous hospital visit where her symptoms were attributed to marijuana use, which she and her family disagreed with, noting the staff was dismissive of her nausea and vomiting.   Past Medical History:   Past Medical History:  Diagnosis Date   History of adenomatous polyp of colon 06/27/2019   Hypertension    Internal hemorrhoids 02/02/2015   Multinodular goiter    Personal history of arterial venous malformation (AVM) 06/27/2019   Tubular adenoma of colon 02/02/2015    Past Surgical History:   Past Surgical History:  Procedure Laterality Date   HYSTERECTOMY  1976    TAH still has ovaries   COLONOSCOPY  02/02/2015   Tubular adenoma of colon/Repeat 1yrs/MGR   EGD  07/08/2019   Normal EGD/No Repeat/MUS   COLONOSCOPY  10/30/2019   Tubular adenoma. 5 year repeat per [facility].   Exploratory Laparotomy with lysis of adhesions N/A 09/13/2024   CORONARY ANGIOPLASTY     FOOT SURGERY      Allergies:   Allergies  Allergen Reactions   Codeine Nausea   Lisinopril Angioedema    Of tongue only    Current Medications:   Prior to Admission medications  Medication Sig Taking? Last Dose  traMADoL  (ULTRAM ) 50 mg tablet Take 50 mg by mouth every 6 (six) hours as needed Yes Taking  amLODIPine  (NORVASC ) 5 MG tablet TAKE 1 TABLET(5 MG) BY MOUTH DAILY    aspirin  81 MG EC tablet Take 1 tablet (81 mg total) by mouth once daily    clopidogreL  (PLAVIX ) 75 mg tablet TAKE 1 TABLET(75 MG) BY MOUTH EVERY DAY    cyanocobalamin  (VITAMIN B12) 1000 MCG tablet Take 1,000 mcg by mouth once daily    docusate (COLACE) 100 MG capsule Take 100 mg by mouth    ferrous sulfate 325 (65 FE) MG tablet Take 325 mg by mouth daily with breakfast    MULTIVITAMIN ORAL Take 1 tablet by mouth once daily    pantoprazole  (PROTONIX ) 40 MG DR tablet Take 1 tablet (40 mg total) by mouth once daily    prochlorperazine  (COMPAZINE ) 10 MG tablet Take 10 mg by mouth    rosuvastatin  (CRESTOR ) 40 MG  tablet TAKE 1 TABLET(40 MG) BY MOUTH EVERY DAY    topiramate  (TOPAMAX ) 50 MG tablet TAKE 1 TABLET(50 MG) BY MOUTH TWICE DAILY    ubrogepant (UBRELVY) 50 mg Tab Take 50 mg by mouth once daily as needed (may repeat in 2 hours)      Family History:   Family History  Problem Relation Name Age of Onset   Diabetes Mother     Emphysema Father     Myocardial Infarction (Heart attack) Brother     Colon cancer Brother  100   Diabetes Other         family hx    Social History:   Social History   Socioeconomic History   Marital status: Widowed  Tobacco Use   Smoking status: Former    Current  packs/day: 0.00    Types: Cigarettes    Quit date: 06/14/2018    Years since quitting: 6.3   Smokeless tobacco: Never  Vaping Use   Vaping status: Never Used  Substance and Sexual Activity   Alcohol use: No    Alcohol/week: 0.0 standard drinks of alcohol   Drug use: Never   Sexual activity: Not Currently    Birth control/protection: Surgical   Social Drivers of Health   Financial Resource Strain: Low Risk  (02/13/2024)   Overall Financial Resource Strain (CARDIA)    Difficulty of Paying Living Expenses: Not hard at all  Food Insecurity: Patient Declined (09/11/2024)   Received from [facility]   Hunger Vital Sign    Within the past 12 months, you worried that your food would run out before you got the money to buy more.: Patient declined    Within the past 12 months, the food you bought just didn't last and you didn't have money to get more.: Patient declined  Transportation Needs: Patient Declined (09/11/2024)   Received from [facility] - [facility]    In the past 12 months, has lack of transportation kept you from medical appointments or from getting medications?: Patient declined    In the past 12 months, has lack of transportation kept you from meetings, work, or from getting things needed for daily living?: Patient declined  Social Connections: Patient Declined (09/11/2024)   Received from [facility]   Social Connection and Isolation Panel    In a typical week, how many times do you talk on the phone with family, friends, or neighbors?: Patient declined    How often do you get together with friends or relatives?: Patient declined    How often do you attend church or religious services?: Patient declined    Do you belong to any clubs or organizations such as church groups, unions, fraternal or athletic groups, or school groups?: Patient declined    How often do you attend meetings of the clubs or organizations you belong to?: Patient declined     Are you married, widowed, divorced, separated, never married, or living with a partner?: Patient declined  Housing Stability: Unknown (10/01/2024)   Housing Stability Vital Sign    Unable to Pay for Housing in the Last Year: No    Homeless in the Last Year: No    Review of Systems:   A 10 point review of systems is negative, except for the pertinent positives and negatives detailed in the HPI.  Vitals:   Vitals:   10/01/24 1104  BP: 130/80  Pulse: 105  SpO2: 98%  Weight: 54.9 kg (121 lb)     Body  mass index is 23.63 kg/m.  Physical Exam:   Physical Exam Vitals and nursing note reviewed.  Constitutional:      General: She is not in acute distress.    Appearance: Normal appearance. She is not ill-appearing, toxic-appearing or diaphoretic.  HENT:     Head: Normocephalic and atraumatic.     Right Ear: External ear normal.     Left Ear: External ear normal.  Eyes:     Conjunctiva/sclera: Conjunctivae normal.  Cardiovascular:     Rate and Rhythm: Normal rate and regular rhythm.     Pulses: Normal pulses.     Heart sounds: Normal heart sounds.  Pulmonary:     Effort: Pulmonary effort is normal.     Breath sounds: Normal breath sounds.  Abdominal:     General: Bowel sounds are normal.     Tenderness: There is no abdominal tenderness.  Skin:    Comments: Well-healing wound in the lower abdomen with Steri-Strips over top.  No surrounding erythema.  Neurological:     Mental Status: She is alert.     Assessment and Plan:  No results found for this visit on 10/01/24.  I reviewed the hospital discharge summary, medication and allergies reconciliation, and any relevant labs/imaging. A phone call was made to the patient within 2 business days of discharge to review their condition, medications, and follow-up needs. Patient seen in clinic today for post-discharge follow-up as part of transition of care management.  Assessment & Plan Small bowel obstruction, status post  lysis of adhesions Ongoing symptoms post-surgery, including abdominal pain and vomiting. - Continue follow-up with general surgery as scheduled. - No vomiting, nausea, or other symptoms at this time.  Hypertension Blood pressure low post-surgery. Amlodipine  held in hospital, now restarted.  - Restarted amlodipine  5 mg - Monitor blood pressure at home twice daily. - Contact provider if blood pressure drops below 110/70 mmHg or if experiencing dizziness or weakness. - Consider discontinuing long-term based off of blood pressure readings over the next few weeks.  Peripheral vascular disease and carotid artery stenosis, status post endarterectomy Managed with Plavix . Blood pressure control essential to prevent complications. - Continue Plavix  as prescribed.  Anemia Low hemoglobin at discharge. Monitoring required post-surgery. - Rechecked hemoglobin levels today.  Hypocalcemia Low calcium  at discharge. Monitoring required post-surgery. - Rechecked calcium  levels today.  Vitamin B12 deficiency Low B12 at discharge. Supplementation ongoing. - Continue daily B12 supplementation. - Check vitamin B12 levels at next appointment with PCP  Disposition: Follow-up as scheduled with PCP  There are no Patient Instructions on file for this visit.   This note has been created using automated tools and reviewed for accuracy by provider.  Patient received an After Visit Summary    Attestation Statement:   I personally performed the service, non-incident to. (WP)   MASON MCCLELLAND MINOR, PA

## 2024-10-21 ENCOUNTER — Other Ambulatory Visit: Payer: Self-pay | Admitting: Internal Medicine

## 2024-10-21 DIAGNOSIS — Z1231 Encounter for screening mammogram for malignant neoplasm of breast: Secondary | ICD-10-CM

## 2025-01-22 ENCOUNTER — Encounter
